# Patient Record
Sex: Male | Born: 1935 | Race: White | Hispanic: No | Marital: Married | Smoking: Former smoker
Health system: Southern US, Community
[De-identification: ages and names within clinical notes are randomized; demographics above are authoritative.]

## PROBLEM LIST (undated history)

## (undated) DIAGNOSIS — I10 Essential (primary) hypertension: Secondary | ICD-10-CM

## (undated) DIAGNOSIS — IMO0001 Reserved for inherently not codable concepts without codable children: Secondary | ICD-10-CM

## (undated) DIAGNOSIS — I351 Nonrheumatic aortic (valve) insufficiency: Secondary | ICD-10-CM

## (undated) DIAGNOSIS — M8580 Other specified disorders of bone density and structure, unspecified site: Secondary | ICD-10-CM

## (undated) DIAGNOSIS — E559 Vitamin D deficiency, unspecified: Secondary | ICD-10-CM

## (undated) DIAGNOSIS — I700 Atherosclerosis of aorta: Secondary | ICD-10-CM

## (undated) DIAGNOSIS — I4510 Unspecified right bundle-branch block: Secondary | ICD-10-CM

## (undated) DIAGNOSIS — C61 Malignant neoplasm of prostate: Secondary | ICD-10-CM

## (undated) DIAGNOSIS — E669 Obesity, unspecified: Secondary | ICD-10-CM

## (undated) HISTORY — DX: Other specified disorders of bone density and structure, unspecified site: M85.80

## (undated) HISTORY — DX: Essential (primary) hypertension: I10

## (undated) HISTORY — DX: Reserved for inherently not codable concepts without codable children: IMO0001

## (undated) HISTORY — DX: Vitamin D deficiency, unspecified: E55.9

## (undated) HISTORY — DX: Atherosclerosis of aorta: I70.0

## (undated) HISTORY — DX: Nonrheumatic aortic (valve) insufficiency: I35.1

## (undated) HISTORY — DX: Unspecified right bundle-branch block: I45.10

## (undated) HISTORY — DX: Malignant neoplasm of prostate: C61

## (undated) HISTORY — DX: Obesity, unspecified: E66.9

---

## 1999-12-24 ENCOUNTER — Other Ambulatory Visit: Admission: RE | Admit: 1999-12-24 | Discharge: 1999-12-24 | Payer: Self-pay | Admitting: Urology

## 2000-01-06 ENCOUNTER — Encounter: Admission: RE | Admit: 2000-01-06 | Discharge: 2000-01-06 | Payer: Self-pay | Admitting: Urology

## 2000-01-06 ENCOUNTER — Encounter: Payer: Self-pay | Admitting: Urology

## 2011-12-14 DIAGNOSIS — L259 Unspecified contact dermatitis, unspecified cause: Secondary | ICD-10-CM | POA: Diagnosis not present

## 2011-12-14 DIAGNOSIS — L578 Other skin changes due to chronic exposure to nonionizing radiation: Secondary | ICD-10-CM | POA: Diagnosis not present

## 2011-12-14 DIAGNOSIS — L570 Actinic keratosis: Secondary | ICD-10-CM | POA: Diagnosis not present

## 2012-03-02 DIAGNOSIS — Z79899 Other long term (current) drug therapy: Secondary | ICD-10-CM | POA: Diagnosis not present

## 2012-03-02 DIAGNOSIS — I10 Essential (primary) hypertension: Secondary | ICD-10-CM | POA: Diagnosis not present

## 2012-03-02 DIAGNOSIS — M1990 Unspecified osteoarthritis, unspecified site: Secondary | ICD-10-CM | POA: Diagnosis not present

## 2012-04-26 DIAGNOSIS — R609 Edema, unspecified: Secondary | ICD-10-CM | POA: Diagnosis not present

## 2012-04-26 DIAGNOSIS — Z0000 Encounter for general adult medical examination without abnormal findings: Secondary | ICD-10-CM | POA: Diagnosis not present

## 2012-04-26 DIAGNOSIS — I129 Hypertensive chronic kidney disease with stage 1 through stage 4 chronic kidney disease, or unspecified chronic kidney disease: Secondary | ICD-10-CM | POA: Diagnosis not present

## 2012-04-26 DIAGNOSIS — I359 Nonrheumatic aortic valve disorder, unspecified: Secondary | ICD-10-CM | POA: Diagnosis not present

## 2012-04-27 DIAGNOSIS — R011 Cardiac murmur, unspecified: Secondary | ICD-10-CM | POA: Diagnosis not present

## 2012-05-03 DIAGNOSIS — H2510 Age-related nuclear cataract, unspecified eye: Secondary | ICD-10-CM | POA: Diagnosis not present

## 2012-05-19 DIAGNOSIS — R609 Edema, unspecified: Secondary | ICD-10-CM | POA: Diagnosis not present

## 2012-05-19 DIAGNOSIS — I129 Hypertensive chronic kidney disease with stage 1 through stage 4 chronic kidney disease, or unspecified chronic kidney disease: Secondary | ICD-10-CM | POA: Diagnosis not present

## 2012-06-09 DIAGNOSIS — I129 Hypertensive chronic kidney disease with stage 1 through stage 4 chronic kidney disease, or unspecified chronic kidney disease: Secondary | ICD-10-CM | POA: Diagnosis not present

## 2012-06-09 DIAGNOSIS — R609 Edema, unspecified: Secondary | ICD-10-CM | POA: Diagnosis not present

## 2012-07-12 DIAGNOSIS — I129 Hypertensive chronic kidney disease with stage 1 through stage 4 chronic kidney disease, or unspecified chronic kidney disease: Secondary | ICD-10-CM | POA: Diagnosis not present

## 2012-07-12 DIAGNOSIS — R350 Frequency of micturition: Secondary | ICD-10-CM | POA: Diagnosis not present

## 2012-07-12 DIAGNOSIS — R609 Edema, unspecified: Secondary | ICD-10-CM | POA: Diagnosis not present

## 2012-07-12 DIAGNOSIS — Z23 Encounter for immunization: Secondary | ICD-10-CM | POA: Diagnosis not present

## 2012-07-12 DIAGNOSIS — I359 Nonrheumatic aortic valve disorder, unspecified: Secondary | ICD-10-CM | POA: Diagnosis not present

## 2012-07-28 DIAGNOSIS — R0602 Shortness of breath: Secondary | ICD-10-CM | POA: Diagnosis not present

## 2012-07-28 DIAGNOSIS — R05 Cough: Secondary | ICD-10-CM | POA: Diagnosis not present

## 2012-07-28 DIAGNOSIS — R609 Edema, unspecified: Secondary | ICD-10-CM | POA: Diagnosis not present

## 2012-08-02 DIAGNOSIS — C61 Malignant neoplasm of prostate: Secondary | ICD-10-CM | POA: Diagnosis not present

## 2012-08-04 DIAGNOSIS — R609 Edema, unspecified: Secondary | ICD-10-CM | POA: Diagnosis not present

## 2012-08-04 DIAGNOSIS — I129 Hypertensive chronic kidney disease with stage 1 through stage 4 chronic kidney disease, or unspecified chronic kidney disease: Secondary | ICD-10-CM | POA: Diagnosis not present

## 2012-08-08 DIAGNOSIS — R609 Edema, unspecified: Secondary | ICD-10-CM | POA: Diagnosis not present

## 2012-08-08 DIAGNOSIS — I129 Hypertensive chronic kidney disease with stage 1 through stage 4 chronic kidney disease, or unspecified chronic kidney disease: Secondary | ICD-10-CM | POA: Diagnosis not present

## 2012-08-08 DIAGNOSIS — Z79899 Other long term (current) drug therapy: Secondary | ICD-10-CM | POA: Diagnosis not present

## 2012-08-08 DIAGNOSIS — Z1331 Encounter for screening for depression: Secondary | ICD-10-CM | POA: Diagnosis not present

## 2012-08-09 DIAGNOSIS — C61 Malignant neoplasm of prostate: Secondary | ICD-10-CM | POA: Diagnosis not present

## 2012-08-23 DIAGNOSIS — I129 Hypertensive chronic kidney disease with stage 1 through stage 4 chronic kidney disease, or unspecified chronic kidney disease: Secondary | ICD-10-CM | POA: Diagnosis not present

## 2012-08-23 DIAGNOSIS — R609 Edema, unspecified: Secondary | ICD-10-CM | POA: Diagnosis not present

## 2012-09-04 DIAGNOSIS — E7800 Pure hypercholesterolemia, unspecified: Secondary | ICD-10-CM | POA: Diagnosis not present

## 2012-09-04 DIAGNOSIS — Z79899 Other long term (current) drug therapy: Secondary | ICD-10-CM | POA: Diagnosis not present

## 2012-09-04 DIAGNOSIS — I129 Hypertensive chronic kidney disease with stage 1 through stage 4 chronic kidney disease, or unspecified chronic kidney disease: Secondary | ICD-10-CM | POA: Diagnosis not present

## 2012-09-04 DIAGNOSIS — I359 Nonrheumatic aortic valve disorder, unspecified: Secondary | ICD-10-CM | POA: Diagnosis not present

## 2012-09-04 DIAGNOSIS — Z0000 Encounter for general adult medical examination without abnormal findings: Secondary | ICD-10-CM | POA: Diagnosis not present

## 2012-09-19 DIAGNOSIS — Z23 Encounter for immunization: Secondary | ICD-10-CM | POA: Diagnosis not present

## 2012-11-06 DIAGNOSIS — I129 Hypertensive chronic kidney disease with stage 1 through stage 4 chronic kidney disease, or unspecified chronic kidney disease: Secondary | ICD-10-CM | POA: Diagnosis not present

## 2012-11-06 DIAGNOSIS — R609 Edema, unspecified: Secondary | ICD-10-CM | POA: Diagnosis not present

## 2013-02-05 DIAGNOSIS — I10 Essential (primary) hypertension: Secondary | ICD-10-CM | POA: Diagnosis not present

## 2013-02-05 DIAGNOSIS — R609 Edema, unspecified: Secondary | ICD-10-CM | POA: Diagnosis not present

## 2013-05-10 DIAGNOSIS — H353190 Nonexudative age-related macular degeneration, unspecified eye, stage unspecified: Secondary | ICD-10-CM | POA: Diagnosis not present

## 2013-05-10 DIAGNOSIS — H2510 Age-related nuclear cataract, unspecified eye: Secondary | ICD-10-CM | POA: Diagnosis not present

## 2013-06-29 DIAGNOSIS — Z23 Encounter for immunization: Secondary | ICD-10-CM | POA: Diagnosis not present

## 2013-08-02 DIAGNOSIS — I359 Nonrheumatic aortic valve disorder, unspecified: Secondary | ICD-10-CM | POA: Diagnosis not present

## 2013-08-02 DIAGNOSIS — R269 Unspecified abnormalities of gait and mobility: Secondary | ICD-10-CM | POA: Diagnosis not present

## 2013-08-02 DIAGNOSIS — G479 Sleep disorder, unspecified: Secondary | ICD-10-CM | POA: Diagnosis not present

## 2013-08-02 DIAGNOSIS — J309 Allergic rhinitis, unspecified: Secondary | ICD-10-CM | POA: Diagnosis not present

## 2013-08-06 DIAGNOSIS — IMO0001 Reserved for inherently not codable concepts without codable children: Secondary | ICD-10-CM | POA: Diagnosis not present

## 2013-08-06 DIAGNOSIS — R269 Unspecified abnormalities of gait and mobility: Secondary | ICD-10-CM | POA: Diagnosis not present

## 2013-08-06 DIAGNOSIS — I359 Nonrheumatic aortic valve disorder, unspecified: Secondary | ICD-10-CM | POA: Diagnosis not present

## 2013-08-06 DIAGNOSIS — M899 Disorder of bone, unspecified: Secondary | ICD-10-CM | POA: Diagnosis not present

## 2013-08-06 DIAGNOSIS — E559 Vitamin D deficiency, unspecified: Secondary | ICD-10-CM | POA: Diagnosis not present

## 2013-08-06 DIAGNOSIS — I10 Essential (primary) hypertension: Secondary | ICD-10-CM | POA: Diagnosis not present

## 2013-08-13 ENCOUNTER — Ambulatory Visit: Payer: Self-pay

## 2013-08-15 ENCOUNTER — Ambulatory Visit: Payer: Medicare Other | Attending: Geriatric Medicine | Admitting: Physical Therapy

## 2013-08-15 DIAGNOSIS — R269 Unspecified abnormalities of gait and mobility: Secondary | ICD-10-CM | POA: Insufficient documentation

## 2013-08-15 DIAGNOSIS — IMO0001 Reserved for inherently not codable concepts without codable children: Secondary | ICD-10-CM | POA: Insufficient documentation

## 2013-08-22 ENCOUNTER — Ambulatory Visit: Payer: Medicare Other

## 2013-08-22 DIAGNOSIS — R269 Unspecified abnormalities of gait and mobility: Secondary | ICD-10-CM | POA: Diagnosis not present

## 2013-08-22 DIAGNOSIS — IMO0001 Reserved for inherently not codable concepts without codable children: Secondary | ICD-10-CM | POA: Diagnosis not present

## 2013-08-24 ENCOUNTER — Ambulatory Visit: Payer: Medicare Other | Admitting: Physical Therapy

## 2013-08-24 DIAGNOSIS — R269 Unspecified abnormalities of gait and mobility: Secondary | ICD-10-CM | POA: Diagnosis not present

## 2013-08-24 DIAGNOSIS — IMO0001 Reserved for inherently not codable concepts without codable children: Secondary | ICD-10-CM | POA: Diagnosis not present

## 2013-09-10 ENCOUNTER — Ambulatory Visit: Payer: Medicare Other | Attending: Geriatric Medicine | Admitting: Physical Therapy

## 2013-09-10 DIAGNOSIS — IMO0001 Reserved for inherently not codable concepts without codable children: Secondary | ICD-10-CM | POA: Diagnosis not present

## 2013-09-10 DIAGNOSIS — R269 Unspecified abnormalities of gait and mobility: Secondary | ICD-10-CM | POA: Insufficient documentation

## 2013-09-12 ENCOUNTER — Ambulatory Visit: Payer: Medicare Other | Admitting: Physical Therapy

## 2013-09-17 ENCOUNTER — Ambulatory Visit: Payer: Medicare Other | Admitting: Physical Therapy

## 2013-09-19 ENCOUNTER — Ambulatory Visit: Payer: Medicare Other | Admitting: Physical Therapy

## 2013-09-24 ENCOUNTER — Encounter: Payer: Self-pay | Admitting: Physical Therapy

## 2013-09-24 DIAGNOSIS — I359 Nonrheumatic aortic valve disorder, unspecified: Secondary | ICD-10-CM | POA: Diagnosis not present

## 2013-09-24 DIAGNOSIS — Z79899 Other long term (current) drug therapy: Secondary | ICD-10-CM | POA: Diagnosis not present

## 2013-09-24 DIAGNOSIS — R609 Edema, unspecified: Secondary | ICD-10-CM | POA: Diagnosis not present

## 2013-09-24 DIAGNOSIS — Z0000 Encounter for general adult medical examination without abnormal findings: Secondary | ICD-10-CM | POA: Diagnosis not present

## 2013-09-24 DIAGNOSIS — I129 Hypertensive chronic kidney disease with stage 1 through stage 4 chronic kidney disease, or unspecified chronic kidney disease: Secondary | ICD-10-CM | POA: Diagnosis not present

## 2013-09-24 DIAGNOSIS — Z1331 Encounter for screening for depression: Secondary | ICD-10-CM | POA: Diagnosis not present

## 2013-10-01 ENCOUNTER — Encounter: Payer: Self-pay | Admitting: Physical Therapy

## 2013-10-03 ENCOUNTER — Ambulatory Visit: Payer: Medicare Other | Admitting: Physical Therapy

## 2013-10-08 ENCOUNTER — Ambulatory Visit: Payer: Medicare Other | Attending: Geriatric Medicine | Admitting: Physical Therapy

## 2013-10-08 DIAGNOSIS — IMO0001 Reserved for inherently not codable concepts without codable children: Secondary | ICD-10-CM | POA: Diagnosis not present

## 2013-10-08 DIAGNOSIS — R269 Unspecified abnormalities of gait and mobility: Secondary | ICD-10-CM | POA: Insufficient documentation

## 2013-11-02 ENCOUNTER — Other Ambulatory Visit (HOSPITAL_COMMUNITY): Payer: Self-pay | Admitting: Geriatric Medicine

## 2013-11-02 ENCOUNTER — Ambulatory Visit (HOSPITAL_COMMUNITY): Payer: Medicare Other | Attending: Geriatric Medicine | Admitting: Cardiology

## 2013-11-02 DIAGNOSIS — I359 Nonrheumatic aortic valve disorder, unspecified: Secondary | ICD-10-CM

## 2013-11-02 DIAGNOSIS — I059 Rheumatic mitral valve disease, unspecified: Secondary | ICD-10-CM | POA: Diagnosis not present

## 2013-11-02 NOTE — Progress Notes (Signed)
Echo performed. 

## 2013-11-07 DIAGNOSIS — C61 Malignant neoplasm of prostate: Secondary | ICD-10-CM | POA: Diagnosis not present

## 2013-11-13 DIAGNOSIS — R351 Nocturia: Secondary | ICD-10-CM | POA: Diagnosis not present

## 2013-11-13 DIAGNOSIS — C61 Malignant neoplasm of prostate: Secondary | ICD-10-CM | POA: Diagnosis not present

## 2014-03-25 DIAGNOSIS — I359 Nonrheumatic aortic valve disorder, unspecified: Secondary | ICD-10-CM | POA: Diagnosis not present

## 2014-03-25 DIAGNOSIS — N183 Chronic kidney disease, stage 3 unspecified: Secondary | ICD-10-CM | POA: Diagnosis not present

## 2014-03-25 DIAGNOSIS — Z6831 Body mass index (BMI) 31.0-31.9, adult: Secondary | ICD-10-CM | POA: Diagnosis not present

## 2014-03-25 DIAGNOSIS — Z79899 Other long term (current) drug therapy: Secondary | ICD-10-CM | POA: Diagnosis not present

## 2014-03-25 DIAGNOSIS — E669 Obesity, unspecified: Secondary | ICD-10-CM | POA: Diagnosis not present

## 2014-03-25 DIAGNOSIS — M79609 Pain in unspecified limb: Secondary | ICD-10-CM | POA: Diagnosis not present

## 2014-03-25 DIAGNOSIS — I129 Hypertensive chronic kidney disease with stage 1 through stage 4 chronic kidney disease, or unspecified chronic kidney disease: Secondary | ICD-10-CM | POA: Diagnosis not present

## 2014-03-25 DIAGNOSIS — Z23 Encounter for immunization: Secondary | ICD-10-CM | POA: Diagnosis not present

## 2014-07-16 DIAGNOSIS — Z23 Encounter for immunization: Secondary | ICD-10-CM | POA: Diagnosis not present

## 2014-09-09 DIAGNOSIS — I10 Essential (primary) hypertension: Secondary | ICD-10-CM | POA: Diagnosis not present

## 2014-09-09 DIAGNOSIS — Z1389 Encounter for screening for other disorder: Secondary | ICD-10-CM | POA: Diagnosis not present

## 2014-09-09 DIAGNOSIS — M79606 Pain in leg, unspecified: Secondary | ICD-10-CM | POA: Diagnosis not present

## 2014-09-09 DIAGNOSIS — I351 Nonrheumatic aortic (valve) insufficiency: Secondary | ICD-10-CM | POA: Diagnosis not present

## 2014-09-09 DIAGNOSIS — Z79899 Other long term (current) drug therapy: Secondary | ICD-10-CM | POA: Diagnosis not present

## 2015-03-18 DIAGNOSIS — H5203 Hypermetropia, bilateral: Secondary | ICD-10-CM | POA: Diagnosis not present

## 2015-03-18 DIAGNOSIS — H25013 Cortical age-related cataract, bilateral: Secondary | ICD-10-CM | POA: Diagnosis not present

## 2015-03-18 DIAGNOSIS — H2513 Age-related nuclear cataract, bilateral: Secondary | ICD-10-CM | POA: Diagnosis not present

## 2015-03-18 DIAGNOSIS — H43813 Vitreous degeneration, bilateral: Secondary | ICD-10-CM | POA: Diagnosis not present

## 2015-03-31 ENCOUNTER — Other Ambulatory Visit: Payer: Self-pay

## 2015-05-06 DIAGNOSIS — I4891 Unspecified atrial fibrillation: Secondary | ICD-10-CM | POA: Diagnosis not present

## 2015-05-06 DIAGNOSIS — Z1389 Encounter for screening for other disorder: Secondary | ICD-10-CM | POA: Diagnosis not present

## 2015-05-06 DIAGNOSIS — Z0000 Encounter for general adult medical examination without abnormal findings: Secondary | ICD-10-CM | POA: Diagnosis not present

## 2015-05-06 DIAGNOSIS — I499 Cardiac arrhythmia, unspecified: Secondary | ICD-10-CM | POA: Diagnosis not present

## 2015-05-06 DIAGNOSIS — N183 Chronic kidney disease, stage 3 (moderate): Secondary | ICD-10-CM | POA: Diagnosis not present

## 2015-05-06 DIAGNOSIS — L989 Disorder of the skin and subcutaneous tissue, unspecified: Secondary | ICD-10-CM | POA: Diagnosis not present

## 2015-05-06 DIAGNOSIS — I351 Nonrheumatic aortic (valve) insufficiency: Secondary | ICD-10-CM | POA: Diagnosis not present

## 2015-05-06 DIAGNOSIS — Z79899 Other long term (current) drug therapy: Secondary | ICD-10-CM | POA: Diagnosis not present

## 2015-05-13 ENCOUNTER — Ambulatory Visit (HOSPITAL_COMMUNITY): Payer: Medicare Other

## 2015-07-08 DIAGNOSIS — Z23 Encounter for immunization: Secondary | ICD-10-CM | POA: Diagnosis not present

## 2015-08-08 DIAGNOSIS — I4891 Unspecified atrial fibrillation: Secondary | ICD-10-CM | POA: Diagnosis not present

## 2015-08-08 DIAGNOSIS — I10 Essential (primary) hypertension: Secondary | ICD-10-CM | POA: Diagnosis not present

## 2016-06-24 DIAGNOSIS — Z23 Encounter for immunization: Secondary | ICD-10-CM | POA: Diagnosis not present

## 2016-07-06 DIAGNOSIS — R600 Localized edema: Secondary | ICD-10-CM | POA: Diagnosis not present

## 2016-07-06 DIAGNOSIS — Z1389 Encounter for screening for other disorder: Secondary | ICD-10-CM | POA: Diagnosis not present

## 2016-07-06 DIAGNOSIS — Z79899 Other long term (current) drug therapy: Secondary | ICD-10-CM | POA: Diagnosis not present

## 2016-07-06 DIAGNOSIS — I4891 Unspecified atrial fibrillation: Secondary | ICD-10-CM | POA: Diagnosis not present

## 2016-07-06 DIAGNOSIS — I10 Essential (primary) hypertension: Secondary | ICD-10-CM | POA: Diagnosis not present

## 2016-07-28 ENCOUNTER — Encounter (HOSPITAL_COMMUNITY): Payer: Self-pay | Admitting: Emergency Medicine

## 2016-07-28 ENCOUNTER — Emergency Department (HOSPITAL_COMMUNITY): Payer: Medicare Other

## 2016-07-28 ENCOUNTER — Inpatient Hospital Stay (HOSPITAL_COMMUNITY)
Admission: EM | Admit: 2016-07-28 | Discharge: 2016-08-02 | DRG: 065 | Disposition: A | Discharge status: Rehabilitation Facility | Payer: Medicare Other | Attending: Neurology | Admitting: Neurology

## 2016-07-28 ENCOUNTER — Inpatient Hospital Stay (HOSPITAL_COMMUNITY): Payer: Medicare Other

## 2016-07-28 DIAGNOSIS — I629 Nontraumatic intracranial hemorrhage, unspecified: Secondary | ICD-10-CM | POA: Diagnosis not present

## 2016-07-28 DIAGNOSIS — R2689 Other abnormalities of gait and mobility: Secondary | ICD-10-CM | POA: Diagnosis present

## 2016-07-28 DIAGNOSIS — Z7982 Long term (current) use of aspirin: Secondary | ICD-10-CM | POA: Diagnosis not present

## 2016-07-28 DIAGNOSIS — I619 Nontraumatic intracerebral hemorrhage, unspecified: Secondary | ICD-10-CM | POA: Diagnosis not present

## 2016-07-28 DIAGNOSIS — I69398 Other sequelae of cerebral infarction: Secondary | ICD-10-CM | POA: Diagnosis not present

## 2016-07-28 DIAGNOSIS — I639 Cerebral infarction, unspecified: Secondary | ICD-10-CM | POA: Diagnosis not present

## 2016-07-28 DIAGNOSIS — I700 Atherosclerosis of aorta: Secondary | ICD-10-CM | POA: Diagnosis present

## 2016-07-28 DIAGNOSIS — Z6830 Body mass index (BMI) 30.0-30.9, adult: Secondary | ICD-10-CM | POA: Diagnosis not present

## 2016-07-28 DIAGNOSIS — E785 Hyperlipidemia, unspecified: Secondary | ICD-10-CM | POA: Diagnosis present

## 2016-07-28 DIAGNOSIS — E559 Vitamin D deficiency, unspecified: Secondary | ICD-10-CM | POA: Diagnosis present

## 2016-07-28 DIAGNOSIS — I10 Essential (primary) hypertension: Secondary | ICD-10-CM | POA: Diagnosis present

## 2016-07-28 DIAGNOSIS — Z87891 Personal history of nicotine dependence: Secondary | ICD-10-CM

## 2016-07-28 DIAGNOSIS — G8194 Hemiplegia, unspecified affecting left nondominant side: Secondary | ICD-10-CM | POA: Diagnosis not present

## 2016-07-28 DIAGNOSIS — E669 Obesity, unspecified: Secondary | ICD-10-CM | POA: Diagnosis present

## 2016-07-28 DIAGNOSIS — F5104 Psychophysiologic insomnia: Secondary | ICD-10-CM | POA: Diagnosis present

## 2016-07-28 DIAGNOSIS — T402X5A Adverse effect of other opioids, initial encounter: Secondary | ICD-10-CM | POA: Diagnosis not present

## 2016-07-28 DIAGNOSIS — I6789 Other cerebrovascular disease: Secondary | ICD-10-CM | POA: Diagnosis not present

## 2016-07-28 DIAGNOSIS — M1612 Unilateral primary osteoarthritis, left hip: Secondary | ICD-10-CM | POA: Diagnosis present

## 2016-07-28 DIAGNOSIS — I69322 Dysarthria following cerebral infarction: Secondary | ICD-10-CM | POA: Diagnosis not present

## 2016-07-28 DIAGNOSIS — I4891 Unspecified atrial fibrillation: Secondary | ICD-10-CM

## 2016-07-28 DIAGNOSIS — C61 Malignant neoplasm of prostate: Secondary | ICD-10-CM | POA: Diagnosis present

## 2016-07-28 DIAGNOSIS — R531 Weakness: Secondary | ICD-10-CM

## 2016-07-28 DIAGNOSIS — I482 Chronic atrial fibrillation, unspecified: Secondary | ICD-10-CM | POA: Diagnosis present

## 2016-07-28 DIAGNOSIS — R29818 Other symptoms and signs involving the nervous system: Secondary | ICD-10-CM | POA: Diagnosis not present

## 2016-07-28 DIAGNOSIS — R600 Localized edema: Secondary | ICD-10-CM | POA: Diagnosis not present

## 2016-07-28 DIAGNOSIS — I509 Heart failure, unspecified: Secondary | ICD-10-CM | POA: Diagnosis present

## 2016-07-28 DIAGNOSIS — I161 Hypertensive emergency: Secondary | ICD-10-CM | POA: Diagnosis present

## 2016-07-28 DIAGNOSIS — Z8546 Personal history of malignant neoplasm of prostate: Secondary | ICD-10-CM | POA: Diagnosis not present

## 2016-07-28 DIAGNOSIS — Z79899 Other long term (current) drug therapy: Secondary | ICD-10-CM | POA: Diagnosis not present

## 2016-07-28 DIAGNOSIS — L89150 Pressure ulcer of sacral region, unstageable: Secondary | ICD-10-CM | POA: Diagnosis present

## 2016-07-28 DIAGNOSIS — S299XXA Unspecified injury of thorax, initial encounter: Secondary | ICD-10-CM | POA: Diagnosis not present

## 2016-07-28 DIAGNOSIS — E782 Mixed hyperlipidemia: Secondary | ICD-10-CM | POA: Diagnosis present

## 2016-07-28 DIAGNOSIS — F4322 Adjustment disorder with anxiety: Secondary | ICD-10-CM | POA: Diagnosis present

## 2016-07-28 DIAGNOSIS — T148XXA Other injury of unspecified body region, initial encounter: Secondary | ICD-10-CM | POA: Diagnosis not present

## 2016-07-28 DIAGNOSIS — I351 Nonrheumatic aortic (valve) insufficiency: Secondary | ICD-10-CM | POA: Diagnosis present

## 2016-07-28 DIAGNOSIS — I4510 Unspecified right bundle-branch block: Secondary | ICD-10-CM | POA: Diagnosis present

## 2016-07-28 DIAGNOSIS — Z888 Allergy status to other drugs, medicaments and biological substances status: Secondary | ICD-10-CM | POA: Diagnosis not present

## 2016-07-28 DIAGNOSIS — I69354 Hemiplegia and hemiparesis following cerebral infarction affecting left non-dominant side: Secondary | ICD-10-CM | POA: Diagnosis not present

## 2016-07-28 DIAGNOSIS — I610 Nontraumatic intracerebral hemorrhage in hemisphere, subcortical: Secondary | ICD-10-CM | POA: Diagnosis not present

## 2016-07-28 DIAGNOSIS — I350 Nonrheumatic aortic (valve) stenosis: Secondary | ICD-10-CM | POA: Diagnosis present

## 2016-07-28 DIAGNOSIS — N390 Urinary tract infection, site not specified: Secondary | ICD-10-CM | POA: Diagnosis not present

## 2016-07-28 DIAGNOSIS — K5903 Drug induced constipation: Secondary | ICD-10-CM | POA: Diagnosis not present

## 2016-07-28 LAB — COMPREHENSIVE METABOLIC PANEL
ALK PHOS: 61 U/L (ref 38–126)
ALT: 21 U/L (ref 17–63)
AST: 30 U/L (ref 15–41)
Albumin: 3.9 g/dL (ref 3.5–5.0)
Anion gap: 9 (ref 5–15)
BILIRUBIN TOTAL: 0.8 mg/dL (ref 0.3–1.2)
BUN: 22 mg/dL — AB (ref 6–20)
CALCIUM: 9.6 mg/dL (ref 8.9–10.3)
CHLORIDE: 106 mmol/L (ref 101–111)
CO2: 24 mmol/L (ref 22–32)
CREATININE: 1.4 mg/dL — AB (ref 0.61–1.24)
GFR, EST AFRICAN AMERICAN: 48 mL/min — AB (ref >60)
GFR, EST NON AFRICAN AMERICAN: 41 mL/min — AB (ref >60)
Glucose, Bld: 135 mg/dL — ABNORMAL HIGH (ref 65–99)
Potassium: 3.8 mmol/L (ref 3.5–5.1)
Sodium: 139 mmol/L (ref 135–145)
TOTAL PROTEIN: 6.7 g/dL (ref 6.5–8.1)

## 2016-07-28 LAB — CBC
HEMATOCRIT: 46.3 % (ref 39.0–52.0)
HEMOGLOBIN: 15.8 g/dL (ref 13.0–17.0)
MCH: 31.8 pg (ref 26.0–34.0)
MCHC: 34.1 g/dL (ref 30.0–36.0)
MCV: 93.2 fL (ref 78.0–100.0)
Platelets: 162 10*3/uL (ref 150–400)
RBC: 4.97 MIL/uL (ref 4.22–5.81)
RDW: 13.9 % (ref 11.5–15.5)
WBC: 7.6 10*3/uL (ref 4.0–10.5)

## 2016-07-28 LAB — I-STAT CHEM 8, ED
BUN: 25 mg/dL — ABNORMAL HIGH (ref 6–20)
CALCIUM ION: 1.1 mmol/L — AB (ref 1.15–1.40)
CHLORIDE: 105 mmol/L (ref 101–111)
CREATININE: 1.4 mg/dL — AB (ref 0.61–1.24)
GLUCOSE: 131 mg/dL — AB (ref 65–99)
HCT: 47 % (ref 39.0–52.0)
HEMOGLOBIN: 16 g/dL (ref 13.0–17.0)
POTASSIUM: 3.7 mmol/L (ref 3.5–5.1)
Sodium: 141 mmol/L (ref 135–145)
TCO2: 24 mmol/L (ref 0–100)

## 2016-07-28 LAB — ETHANOL: Alcohol, Ethyl (B): <5 mg/dL (ref <5)

## 2016-07-28 LAB — DIFFERENTIAL
BASOS ABS: 0.1 10*3/uL (ref 0.0–0.1)
Basophils Relative: 1 %
Eosinophils Absolute: 0.2 10*3/uL (ref 0.0–0.7)
Eosinophils Relative: 2 %
LYMPHS ABS: 2.4 10*3/uL (ref 0.7–4.0)
LYMPHS PCT: 32 %
MONO ABS: 0.7 10*3/uL (ref 0.1–1.0)
MONOS PCT: 9 %
NEUTROS ABS: 4.3 10*3/uL (ref 1.7–7.7)
Neutrophils Relative %: 56 %

## 2016-07-28 LAB — APTT: APTT: 33 s (ref 24–36)

## 2016-07-28 LAB — PROTIME-INR
INR: 0.98
Prothrombin Time: 13 seconds (ref 11.4–15.2)

## 2016-07-28 LAB — I-STAT TROPONIN, ED: TROPONIN I, POC: 0.02 ng/mL (ref 0.00–0.08)

## 2016-07-28 LAB — CBG MONITORING, ED: Glucose-Capillary: 138 mg/dL — ABNORMAL HIGH (ref 65–99)

## 2016-07-28 MED ORDER — STROKE: EARLY STAGES OF RECOVERY BOOK
Freq: Once | Status: AC
  Administered 2016-07-29: 02:00:00
  Filled 2016-07-28: qty 1

## 2016-07-28 MED ORDER — ACETAMINOPHEN 650 MG RE SUPP: 650.0000 mg | RECTAL | Status: DC | PRN

## 2016-07-28 MED ORDER — SENNOSIDES-DOCUSATE SODIUM 8.6-50 MG PO TABS
1.0000 | ORAL_TABLET | Freq: Two times a day (BID) | ORAL | Status: DC
  Administered 2016-07-29 – 2016-08-02 (×8): 1 via ORAL
  Filled 2016-07-28 (×9): qty 1

## 2016-07-28 MED ORDER — LABETALOL HCL 5 MG/ML IV SOLN
10.0000 mg | Freq: Once | INTRAVENOUS | Status: AC
  Administered 2016-07-28: 10 mg via INTRAVENOUS
  Filled 2016-07-28: qty 4

## 2016-07-28 MED ORDER — FUROSEMIDE 10 MG/ML IJ SOLN
20.0000 mg | Freq: Every day | INTRAMUSCULAR | Status: DC
  Filled 2016-07-28: qty 2

## 2016-07-28 MED ORDER — CLEVIDIPINE BUTYRATE 0.5 MG/ML IV EMUL
0.0000 mg/h | INTRAVENOUS | Status: DC
  Administered 2016-07-28: 1 mg/h via INTRAVENOUS
  Administered 2016-07-29: 5 mg/h via INTRAVENOUS
  Administered 2016-07-29 (×2): 3 mg/h via INTRAVENOUS
  Administered 2016-07-29: 2 mg/h via INTRAVENOUS
  Filled 2016-07-28 (×6): qty 50

## 2016-07-28 MED ORDER — FAMOTIDINE IN NACL 20-0.9 MG/50ML-% IV SOLN
20.0000 mg | INTRAVENOUS | Status: DC
  Administered 2016-07-29: 20 mg via INTRAVENOUS
  Filled 2016-07-28: qty 50

## 2016-07-28 MED ORDER — ACETAMINOPHEN 325 MG PO TABS
650.0000 mg | ORAL_TABLET | ORAL | Status: DC | PRN
  Administered 2016-07-31 – 2016-08-02 (×5): 650 mg via ORAL
  Filled 2016-07-28 (×6): qty 2

## 2016-07-28 NOTE — H&P (Addendum)
Neurology H&P  CC: Left-sided weakness  History is obtained from: Patient  HPI: Thomas Burnett is a 82 y.o. male with a history of hypertension who is only currently taking Lasix. He was in his normal state of health until 19:15 at which time he developed left-sided weakness. EMS responded and found him to have left-sided weakness and therefore called a code stroke and brought him into the emergency department. On their initial evaluation, he had blood pressures in the 220s.  In CT, he was observed to have a small thalamic hemorrhage. He was given labetalol, and initially seemed to have a good response but subsequently his blood pressure went back up and he was started on call to proximal.   LKW: 19:15 tpa given?: no, ICH ICH Score: 1   ROS: A 14 point ROS was performed and is negative except as noted in the HPI.   Past Medical History:  Diagnosis Date  . Hypertension   . Mild aortic insufficiency   . Mild aortic sclerosis ([place])   . Obesity   . Osteopenia   . Prostate cancer ([place])    STAGE 2  . Right bundle branch block (RBBB)   . Vitamin D deficiency      History reviewed. No pertinent family history.   Social History:  reports that he has quit smoking. He has never used smokeless tobacco. He reports that he drinks alcohol. He reports that he does not use drugs.   Exam: Current vital signs: BP 136/93   Pulse 86   Temp 97.9 F (36.6 C) (Oral)   Resp (!) 28   Ht 5\' 4"  (1.626 m)   Wt 81.6 kg (180 lb)   SpO2 95%   BMI 30.90 kg/m  Vital signs in last 24 hours: Temp:  [97.9 F (36.6 C)] 97.9 F (36.6 C) (10/25 2035) Pulse Rate:  [30-93] 86 (10/25 2200) Resp:  [15-28] 28 (10/25 2200) BP: (129-237)/(70-106) 136/93 (10/25 2200) SpO2:  [93 %-98 %] 95 % (10/25 2200) Weight:  [81.6 kg (180 lb)] 81.6 kg (180 lb) (10/25 2035)   Physical Exam  Constitutional: Appears well-developed and well-nourished.  Psych: Affect appropriate to situation Eyes: No scleral  injection HENT: No OP obstrucion Head: Normocephalic.  Cardiovascular: Normal rate and regular rhythm.  Respiratory: Effort normal and breath sounds normal to anterior ascultation GI: Soft.  No distension. There is no tenderness.  Skin: WDI  Neuro: Mental Status: Patient is awake, alert, oriented to person, place, month, year, and situation. Patient is able to give a clear and coherent history. No signs of aphasia or neglect Cranial Nerves: II: Visual Fields are full. Pupils are equal, round, and reactive to light.   III,IV, VI: EOMI without ptosis or diploplia.  V: Facial sensation is symmetric to temperature VII: Facial movement is notable for possible mild left weakness VIII: hearing is intact to voice X: Uvula elevates symmetrically XI: Shoulder shrug is symmetric. XII: tongue is midline without atrophy or fasciculations.  Motor: Tone is normal. Bulk is normal. 5/5 strength was present on the right side, he has poorly coordinated, ballistic movements of the left arm and leg with 3/5 weakness. Sensory: Sensation is symmetric to light touch and temperature in the arms and legs. Cerebellar: He has intact finger-nose-finger on the right, he is unable to perform on the left.   I have reviewed labs in epic and the results pertinent to this consultation are: Borderline creatinine  I have reviewed the images obtained: CT head thalamic hemorrhage  on the right  Impression: 82 year old gentleman with thalamic hemorrhage. He will need aggressive blood pressure control and therefore is being admitted to the intensive care unit. I did discuss CODE STATUS with him, and he is indeed full code. He does indicate, however, that he would not want any type of surgical procedure.  Recommendations: 1) Admit to ICU 2) no antiplatelets or anticoagulants 3) blood pressure control with goal systolic [id number] 4) Frequent neuro checks 5) If symptoms worsen or there is decreased mental status, repeat  stat head CT 6) PT,OT,ST 7) continue Lasix 8) cleviprex for BP control  This patient is critically ill and at significant risk of neurological worsening, death and care requires constant monitoring of vital signs, hemodynamics,respiratory and cardiac monitoring, neurological assessment, discussion with family, other specialists and medical decision making of high complexity. I spent 50 minutes of neurocritical care time  in the care of  this patient.  Roland Rack, MD [facility] [phone]  If 7pm- 7am, please page neurology on call as listed in [employer]. 07/28/2016  10:16 PM

## 2016-07-28 NOTE — ED Notes (Signed)
Pt able to move LEFT arm at this time however has no control over movements or coordination of LEFT arm; initial NIH 3; PASSED RN swallow screen; treating HTN with cleviprex - still titrating; CT showed hemorrhagic stroke therefore TPA not given; pt CAOx4 at this time; family at bedside very supportive and understands diagnosis; pts heart rate in Afib and occasionally in A flutter

## 2016-07-28 NOTE — ED Provider Notes (Signed)
[place] DEPT Provider Note   CSN: [id number] Arrival date & time: 07/28/16  2013   An emergency department physician performed an initial assessment on this suspected stroke patient at 71.  History   Chief Complaint Left-sided weakness  HPI Thomas Burnett is a 82 y.o. male.  HPI Patient presents to the emergency room for evaluation of possible stroke.  Patient states he had left-sided weakness this evening. He was sitting on a stool and fell off. Patient denies hitting his head. He denies loss of consciousness. He denies any neck pain. Patient denies any chest pain or shortness of breath. EMS activated a code stroke. The patient was evaluated by Dr. Leonel Ramsay at the bridge. Past Medical History:  Diagnosis Date  . Hypertension   . Mild aortic insufficiency   . Mild aortic sclerosis ([place])   . Obesity   . Osteopenia   . Prostate cancer ([place])    STAGE 2  . Right bundle branch block (RBBB)   . Vitamin D deficiency     There are no active problems to display for this patient.   History reviewed. No pertinent surgical history.     Home Medications    Prior to Admission medications   Medication Sig Start Date End Date Taking? Authorizing Provider  Cholecalciferol (VITAMIN D-3) 1000 UNITS CAPS Take by mouth.    Historical Provider, MD  furosemide (LASIX) 40 MG tablet Take 40 mg by mouth as needed.    Historical Provider, MD  naproxen sodium (ANAPROX) 220 MG tablet Take 220 mg by mouth 2 (two) times daily with a meal.    Historical Provider, MD    Family History History reviewed. No pertinent family history.  Social History Social History  Substance Use Topics  . Smoking status: Former Research scientist (life sciences)  . Smokeless tobacco: Never Used  . Alcohol use Yes     Allergies   Xarelto [rivaroxaban]   Review of Systems Review of Systems  All other systems reviewed and are negative.    Physical Exam Updated Vital Signs BP (!) 237/85 (BP Location: Right Arm)    Pulse 67   Temp 97.9 F (36.6 C) (Oral)   Resp 21   Ht 5\' 4"  (1.626 m)   Wt 81.6 kg   SpO2 98%   BMI 30.90 kg/m   Physical Exam  Constitutional: No distress.  Elderly frail  HENT:  Head: Normocephalic and atraumatic.  Right Ear: External ear normal.  Left Ear: External ear normal.  Eyes: Conjunctivae are normal. Right eye exhibits no discharge. Left eye exhibits no discharge. No scleral icterus.  Neck: Neck supple. No tracheal deviation present.  Cardiovascular: Normal rate, regular rhythm and intact distal pulses.   Pulmonary/Chest: Effort normal and breath sounds normal. No stridor. No respiratory distress. He has no wheezes. He has no rales.  Abdominal: Soft. Bowel sounds are normal. He exhibits no distension. There is no tenderness. There is no rebound and no guarding.  Musculoskeletal: He exhibits no edema or tenderness.  Superficial skin tear left elbow, no pain with range of motion, no tenderness palpation upper or lower extremities, no spinal tenderness  Neurological: He is alert. He has normal strength. No cranial nerve deficit (no facial droop, extraocular movements intact, no slurred speech) or sensory deficit. He exhibits normal muscle tone. He displays no seizure activity. Coordination normal.  Left upper extrem and lower extrem weakness  Skin: Skin is warm and dry. No rash noted.  Psychiatric: He has a normal mood and affect.  Nursing note and vitals reviewed.    ED Treatments / Results  Labs (all labs ordered are listed, but only abnormal results are displayed) Labs Reviewed  I-STAT CHEM 8, ED - Abnormal; Notable for the following:       Result Value   BUN 25 (*)    Creatinine, Ser 1.40 (*)    Glucose, Bld 131 (*)    Calcium, Ion 1.10 (*)    All other components within normal limits  CBG MONITORING, ED - Abnormal; Notable for the following:    Glucose-Capillary 138 (*)    All other components within normal limits  PROTIME-INR  APTT  CBC  DIFFERENTIAL    ETHANOL  COMPREHENSIVE METABOLIC PANEL  RAPID URINE DRUG SCREEN, HOSP PERFORMED  URINALYSIS, ROUTINE W REFLEX MICROSCOPIC (NOT AT [facility])  I-STAT TROPOININ, ED    EKG  EKG Interpretation  Date/Time:  Wednesday July 28 2016 20:32:51 EDT Ventricular Rate:  65 PR Interval:    QRS Duration: 165 QT Interval:  458 QTC Calculation: 477 R Axis:   95 Text Interpretation:  Atrial flutter with predominant 4:1 AV block RBBB and LPFB No previous tracing Confirmed by Treniece Holsclaw  MD-J, Johnhenry Tippin [id number]) on 07/28/2016 8:45:53 PM       Radiology No results found.  Procedures Procedures (including critical care time)  Medications Ordered in ED Medications  labetalol (NORMODYNE,TRANDATE) injection 10 mg (not administered)     Initial Impression / Assessment and Plan / ED Course  I have reviewed the triage vital signs and the nursing notes.  Pertinent labs & imaging results that were available during my care of the patient were reviewed by me and considered in my medical decision making (see chart for details).  Clinical Course   Patient's CT scan shows a hemorrhagic stroke.  Patient denies any neck pain but he mentions falling off a stool. Plan on doing CT of the cervical spine. Patient is hypertensive and Dr. Leonel Ramsay ordered a dose of labetalol. Plan is for the patient be admitted to the neuro intensive care unit.  Final Clinical Impressions(s) / ED Diagnoses   Final diagnoses:  Hemorrhagic stroke [facility])  Atrial fibrillation, unspecified type (HCC)      Dorie Rank, MD 07/28/16 2048

## 2016-07-28 NOTE — ED Notes (Signed)
Connected pt back to monitor.

## 2016-07-28 NOTE — ED Notes (Signed)
RN called to inform that pt ready for CT C-spine

## 2016-07-28 NOTE — ED Notes (Addendum)
Condom cath placed at this time; no urine output

## 2016-07-28 NOTE — ED Triage Notes (Signed)
Pt presents from home with [facility] with stroke like symptoms consisting of LEFT arm weakness/ no control of the LEFT arm; pt states he was in the bathroom getting ready for bed and dropped tooth paste, bent down to pick up the tooth paste and lost balance and fell striking the LEFT shoulder and LEFT elbow; pt denies pain however a skin tear noted to LEFT elbow; LSN time was 1930; EMS reports hypertension enroute; pt denies visual changes and speech changes; pt CAOx4 at this time answering all questions appropriately; pt denies being on blood thinners

## 2016-07-29 ENCOUNTER — Inpatient Hospital Stay (HOSPITAL_COMMUNITY): Payer: Medicare Other

## 2016-07-29 DIAGNOSIS — I482 Chronic atrial fibrillation: Secondary | ICD-10-CM

## 2016-07-29 DIAGNOSIS — I639 Cerebral infarction, unspecified: Secondary | ICD-10-CM

## 2016-07-29 DIAGNOSIS — I10 Essential (primary) hypertension: Secondary | ICD-10-CM

## 2016-07-29 DIAGNOSIS — I6789 Other cerebrovascular disease: Secondary | ICD-10-CM

## 2016-07-29 LAB — CBC
HCT: 44.2 % (ref 39.0–52.0)
HEMOGLOBIN: 15 g/dL (ref 13.0–17.0)
MCH: 32.1 pg (ref 26.0–34.0)
MCHC: 33.9 g/dL (ref 30.0–36.0)
MCV: 94.4 fL (ref 78.0–100.0)
PLATELETS: 168 10*3/uL (ref 150–400)
RBC: 4.68 MIL/uL (ref 4.22–5.81)
RDW: 14.4 % (ref 11.5–15.5)
WBC: 8.2 10*3/uL (ref 4.0–10.5)

## 2016-07-29 LAB — BASIC METABOLIC PANEL
Anion gap: 8 (ref 5–15)
BUN: 18 mg/dL (ref 6–20)
CHLORIDE: 107 mmol/L (ref 101–111)
CO2: 24 mmol/L (ref 22–32)
CREATININE: 1.11 mg/dL (ref 0.61–1.24)
Calcium: 9.3 mg/dL (ref 8.9–10.3)
GFR, EST NON AFRICAN AMERICAN: 55 mL/min — AB (ref >60)
Glucose, Bld: 129 mg/dL — ABNORMAL HIGH (ref 65–99)
POTASSIUM: 3.6 mmol/L (ref 3.5–5.1)
SODIUM: 139 mmol/L (ref 135–145)

## 2016-07-29 LAB — URINALYSIS, ROUTINE W REFLEX MICROSCOPIC
BILIRUBIN URINE: NEGATIVE
GLUCOSE, UA: NEGATIVE mg/dL
HGB URINE DIPSTICK: NEGATIVE
KETONES UR: NEGATIVE mg/dL
Leukocytes, UA: NEGATIVE
Nitrite: NEGATIVE
Protein, ur: 30 mg/dL — AB
Specific Gravity, Urine: 1.018 (ref 1.005–1.030)
pH: 7 (ref 5.0–8.0)

## 2016-07-29 LAB — VAS US CAROTID
LCCADDIAS: 11 cm/s
LEFT ECA DIAS: 0 cm/s
LEFT VERTEBRAL DIAS: 13 cm/s
LICADDIAS: -12 cm/s
LICAPDIAS: -11 cm/s
LICAPSYS: -140 cm/s
Left CCA dist sys: 104 cm/s
Left CCA prox dias: 8 cm/s
Left CCA prox sys: 103 cm/s
Left ICA dist sys: -51 cm/s
RIGHT ECA DIAS: -1 cm/s
RIGHT VERTEBRAL DIAS: 0 cm/s
Right CCA prox dias: 8 cm/s
Right CCA prox sys: 77 cm/s
Right cca dist sys: -56 cm/s

## 2016-07-29 LAB — RAPID URINE DRUG SCREEN, HOSP PERFORMED
AMPHETAMINES: NOT DETECTED
BARBITURATES: NOT DETECTED
BENZODIAZEPINES: NOT DETECTED
COCAINE: NOT DETECTED
Opiates: NOT DETECTED
TETRAHYDROCANNABINOL: NOT DETECTED

## 2016-07-29 LAB — URINE MICROSCOPIC-ADD ON: RBC / HPF: NONE SEEN RBC/hpf (ref 0–5)

## 2016-07-29 LAB — TSH: TSH: 2.365 u[IU]/mL (ref 0.350–4.500)

## 2016-07-29 LAB — VITAMIN B12: VITAMIN B 12: 211 pg/mL (ref 180–914)

## 2016-07-29 LAB — ECHOCARDIOGRAM COMPLETE
Height: 66 in
Weight: 2980.62 oz

## 2016-07-29 LAB — MRSA PCR SCREENING: MRSA BY PCR: NEGATIVE

## 2016-07-29 LAB — LIPID PANEL
CHOL/HDL RATIO: 3.6 ratio
CHOLESTEROL: 220 mg/dL — AB (ref 0–200)
HDL: 61 mg/dL (ref >40)
LDL Cholesterol: 146 mg/dL — ABNORMAL HIGH (ref 0–99)
TRIGLYCERIDES: 63 mg/dL (ref <150)
VLDL: 13 mg/dL (ref 0–40)

## 2016-07-29 MED ORDER — PANTOPRAZOLE SODIUM 40 MG PO TBEC
40.0000 mg | DELAYED_RELEASE_TABLET | Freq: Every day | ORAL | Status: DC
  Administered 2016-07-29 – 2016-08-02 (×5): 40 mg via ORAL
  Filled 2016-07-29 (×5): qty 1

## 2016-07-29 MED ORDER — LISINOPRIL 20 MG PO TABS
20.0000 mg | ORAL_TABLET | Freq: Every day | ORAL | Status: DC
  Administered 2016-07-29 – 2016-08-02 (×5): 20 mg via ORAL
  Filled 2016-07-29 (×5): qty 1

## 2016-07-29 MED ORDER — LORATADINE 10 MG PO TABS
10.0000 mg | ORAL_TABLET | Freq: Every day | ORAL | Status: DC
  Administered 2016-07-31 – 2016-08-02 (×3): 10 mg via ORAL
  Filled 2016-07-29 (×4): qty 1

## 2016-07-29 MED ORDER — VITAMIN D 1000 UNITS PO TABS
1000.0000 [IU] | ORAL_TABLET | Freq: Every day | ORAL | Status: DC
  Administered 2016-07-29 – 2016-08-02 (×5): 1000 [IU] via ORAL
  Filled 2016-07-29 (×5): qty 1

## 2016-07-29 MED ORDER — HYDRALAZINE HCL 25 MG PO TABS
25.0000 mg | ORAL_TABLET | Freq: Three times a day (TID) | ORAL | Status: DC
  Administered 2016-07-29 – 2016-07-31 (×7): 25 mg via ORAL
  Filled 2016-07-29 (×7): qty 1

## 2016-07-29 MED ORDER — FUROSEMIDE 20 MG PO TABS
20.0000 mg | ORAL_TABLET | Freq: Every day | ORAL | Status: DC
  Administered 2016-07-29 – 2016-08-02 (×5): 20 mg via ORAL
  Filled 2016-07-29 (×5): qty 1

## 2016-07-29 NOTE — Progress Notes (Signed)
  Echocardiogram 2D Echocardiogram has been performed.  Thomas Burnett 07/29/2016, 3:24 PM

## 2016-07-29 NOTE — Progress Notes (Signed)
Patient had a 18 beat run of v-tach. Asymptomatic, alert with no complaints. Back in a heart block, irregular rhythm rate 70s. Notified Leonel Ramsay, order received for BMET and mag. Will continue to monitor.

## 2016-07-29 NOTE — Evaluation (Signed)
Speech Language Pathology Evaluation Patient Details Name: Thomas Burnett MRN: [id number] DOB: Nov 14, 1919 Today's Date: 07/29/2016 Time: [id number] SLP Time Calculation (min) (ACUTE ONLY): 11 min  Problem List:  Patient Active Problem List   Diagnosis Date Noted  . ICH (intracerebral hemorrhage) ([place]) 07/28/2016   Past Medical History:  Past Medical History:  Diagnosis Date  . Hypertension   . Mild aortic insufficiency   . Mild aortic sclerosis ([place])   . Obesity   . Osteopenia   . Prostate cancer ([place])    STAGE 2  . Right bundle branch block (RBBB)   . Vitamin D deficiency    Past Surgical History: History reviewed. No pertinent surgical history. HPI:  Thomas Wollam Johnsonis a 82 y.o.malewith a history of hypertension, mild aortic sclerosis admitted with left-sided weakness. In CT, he was observed to have a small thalamic hemorrhage   Assessment / Plan / Recommendation Clinical Impression  Pt's language and cognition appear within functional limits on selective subtests of Cognistat. Pt and wife deny cogntive or speech differences. No follow up needed from ST services at this time.    SLP Assessment  Patient does not need any further Speech Lanaguage Pathology Services    Follow Up Recommendations  None    Frequency and Duration           SLP Evaluation Cognition  Overall Cognitive Status: Within Functional Limits for tasks assessed Arousal/Alertness: Awake/alert Orientation Level: Oriented X4 Attention: Sustained Sustained Attention: Appears intact Memory: Appears intact Awareness: Appears intact Problem Solving: Appears intact Safety/Judgment: Appears intact       Comprehension  Auditory Comprehension Overall Auditory Comprehension: Appears within functional limits for tasks assessed Visual Recognition/Discrimination Discrimination: Not tested Reading Comprehension Reading Status: Not tested    Expression Expression Primary Mode of Expression:  Verbal Verbal Expression Overall Verbal Expression: Appears within functional limits for tasks assessed Initiation: No impairment Level of Generative/Spontaneous Verbalization: Conversation Repetition:  (NT) Naming: No impairment Pragmatics: No impairment Written Expression Dominant Hand: Right Written Expression: Not tested   Oral / Motor  Oral Motor/Sensory Function Overall Oral Motor/Sensory Function:  (slight left labial droop) Motor Speech Overall Motor Speech: Appears within functional limits for tasks assessed Respiration: Within functional limits Phonation: Normal Resonance: Within functional limits Articulation: Within functional limitis Intelligibility: Intelligible Motor Planning: Witnin functional limits   GO                    Houston Siren 07/29/2016, 9:56 AM  Orbie Pyo Yolandra Habig M.Ed [employer] [phone]

## 2016-07-29 NOTE — Progress Notes (Signed)
**  Preliminary report by tech**  Carotid artery duplex completed. Findings are consistent with a 1-39 percent stenosis involving the right internal carotid artery and the left internal carotid artery. The vertebral arteries demonstrate antegrade flow.  07/29/16 3:41 PM Thomas Burnett RVT

## 2016-07-29 NOTE — Progress Notes (Signed)
PT Cancellation Note  Patient Details Name: Thomas Burnett MRN: [id number] DOB: 24-Sep-1920   Cancelled Treatment:    Reason Eval/Treat Not Completed: Patient not medically ready; patient still on strict bedrest per RN.  Will attempt another day.   Reginia Naas 07/29/2016, 10:56 AM  Magda Kiel, [place] 07/29/2016

## 2016-07-29 NOTE — Progress Notes (Signed)
STROKE TEAM PROGRESS NOTE   HISTORY OF PRESENT ILLNESS (per record) Thomas Burnett is a 82 y.o. male with a history of hypertension who is only currently taking Lasix. He was in his normal state of health until 19:15 07/28/2016 (LKW) at which time he developed left-sided weakness. EMS responded and found him to have left-sided weakness and therefore called a code stroke and brought him into the emergency department. On their initial evaluation, he had blood pressures in the 220s. In CT, he was observed to have a small thalamic hemorrhage. He was given labetalol, and initially seemed to have a good response but subsequently his blood pressure went back up. ICH Score: 1. He was admitted to the neuro ICU for further evaluation and treatment.   SUBJECTIVE (INTERVAL HISTORY) Wife is at bedside. Pt stated that he still has left arm weakness and incoordination. He has left leg weakness at baseline and walk with walking but still drag and limp on the left. Not sure if left leg weaker after the [place] yet as he did not try to walk yet. Wife said he takes lasix for left leg swelling and he sleeps in chair every night. He also has Afib in the past, tried Xarelto by PCP Dr. Felipa Eth but developed rash and hives. Xarelto was stopped and no more blood thinners were given.    OBJECTIVE Temp:  [97.6 F (36.4 C)-98 F (36.7 C)] 97.6 F (36.4 C) (10/26 0400) Pulse Rate:  [25-112] 87 (10/26 0715) Cardiac Rhythm: Bundle branch block;Heart block (10/25 2335) Resp:  [15-28] 15 (10/26 0715) BP: (95-237)/(59-145) 123/65 (10/26 0715) SpO2:  [90 %-100 %] 93 % (10/26 0715) Weight:  [81.6 kg (180 lb)-84.5 kg (186 lb 4.6 oz)] 84.5 kg (186 lb 4.6 oz) (10/25 2345)  CBC:  Recent Labs Lab 07/28/16 2010 07/28/16 2023  WBC 7.6  --   NEUTROABS 4.3  --   HGB 15.8 16.0  HCT 46.3 47.0  MCV 93.2  --   PLT 162  --     Basic Metabolic Panel:  Recent Labs Lab 07/28/16 2010 07/28/16 2023  NA 139 141  K 3.8 3.7  CL  106 105  CO2 24  --   GLUCOSE 135* 131*  BUN 22* 25*  CREATININE 1.40* 1.40*  CALCIUM 9.6  --     Lipid Panel: No results found for: CHOL, TRIG, HDL, CHOLHDL, VLDL, LDLCALC HgbA1c: No results found for: HGBA1C Urine Drug Screen:    Component Value Date/Time   LABOPIA NONE DETECTED 07/28/2016 0546   COCAINSCRNUR NONE DETECTED 07/28/2016 0546   LABBENZ NONE DETECTED 07/28/2016 0546   AMPHETMU NONE DETECTED 07/28/2016 0546   THCU NONE DETECTED 07/28/2016 0546   LABBARB NONE DETECTED 07/28/2016 0546      IMAGING I have personally reviewed the radiological images below and agree with the radiology interpretations.  Ct Cervical Spine Wo Contrast 07/28/2016 No acute/traumatic cervical spine pathology. Multilevel degenerative changes.   Dg Chest Portable 1 View 07/28/2016 No acute abnormality noted.   Ct Head Code Stroke Wo Contrast 07/28/2016 1. 13 x 15 x 25 mm acute right thalamic parenchymal hemorrhage, estimated volume 2.4 cc. Mild localized edema without significant mass effect. No intraventricular extension. Hypertensive etiology is suspected. 2. Generalized age-related cerebral atrophy with chronic microvascular ischemic disease.   CUS pending  TTE pending  Repeat CT pending   PHYSICAL EXAM  Temp:  [97.6 F (36.4 C)-98 F (36.7 C)] 97.6 F (36.4 C) (10/26 0800) Pulse Rate:  [25-112] 87 (  10/26 0715) Resp:  [15-28] 15 (10/26 0715) BP: (95-237)/(59-145) 123/65 (10/26 0715) SpO2:  [90 %-100 %] 93 % (10/26 0715) Weight:  [180 lb (81.6 kg)-186 lb 4.6 oz (84.5 kg)] 186 lb 4.6 oz (84.5 kg) (10/25 2345)  General - Well nourished, well developed, in no apparent distress.  Ophthalmologic - Fundi not visualized due to noncooperation.  Cardiovascular - irregularly irregular heart rate and rhythm.  Mental Status -  Level of arousal and orientation to time, place, and person were intact. Language including expression, naming, repetition, comprehension was assessed and  found intact, mild dysarthria Fund of Knowledge was assessed and was intact.  Cranial Nerves II - XII - II - Visual field intact OU. III, IV, VI - Extraocular movements intact. V - Facial sensation intact bilaterally. VII - Facial movement intact bilaterally. VIII - Hearing & vestibular intact bilaterally. X - Palate elevates symmetrically. XI - Chin turning & shoulder shrug intact bilaterally. XII - Tongue protrusion intact.  Motor Strength - The patient's strength was normal in RUE and RLE, but 3-/5 LUE and 4/5 LLE .  Bulk was normal and fasciculations were absent.   Motor Tone - Muscle tone was assessed at the neck and appendages and was normal.  Reflexes - The patient's reflexes were 1+ in all extremities and he had no pathological reflexes.  Sensory - Light touch, temperature/pinprick were assessed and were decreased on the LUE.    Coordination - The patient had significant LUE ataxia.  Tremor was absent.  Gait and Station - not tested.   ASSESSMENT/PLAN Mr. Thomas Burnett is a 82 y.o. male with history of HTN presenting with L sided weakness and hypertensive emergency. CT showed R thalamic hemorrhage.   Stroke:  Non-dominant right small thalamic hemorrhage secondary to hypertensive emergency  Resultant  LUE weakness and incoordination  CT R thalamic hemorrhage, 2.4cc  Repeat CT head pending  Carotid Doppler  pending   2D Echo  pending   LDL pending   HgbA1c pending  SCDs for VTE prophylaxis  Diet regular Room service appropriate? Yes; Fluid consistency: Thin  aspirin 81 mg daily prior to admission. Had been in [place] for afib but stopped due to allergy. Now on no antithrombotics due to [place]. May consider eliquis in the future once ICH resolves.  Ongoing aggressive stroke risk factor management  Patient does not desire surgical procedures  Therapy recommendations:  pending   Disposition:  pending   Hypertensive Emergency  SBP 220s on arrival in  setting of neurologic emergency  Lasix at home  Unstable, on cleviprex now, taper off as able  Resume lasix and put on hydralazine  SBP goal < 160  Long-term BP goal normotensive  Chronic afib  Has tried Xarelto in the past, stopped due to rash and hives  No anticoagulation now due to [place]  May consider eliquis in the future once ICH resolves  Other Stroke Risk Factors  Advanced age  ETOH use, advised to drink no more than 2 drink(s) a day  Obesity, Body mass index is 30.07 kg/m., recommend weight loss, diet and exercise as appropriate   Other Active Problems  Stage 2 prostate cancer  RBBB   Baseline LLE weakness and walk with walker  Hospital day # 1  This patient is critically ill due to right thalamic ICH, hypertensive emergency, afib persistent and at significant risk of neurological worsening, death form hemorrhage extension, heart failure, afib RVR, ischemic infarct. This patient's care requires constant monitoring of vital signs,  hemodynamics, respiratory and cardiac monitoring, review of multiple databases, neurological assessment, discussion with family, other specialists and medical decision making of high complexity. I spent 40 minutes of neurocritical care time in the care of this patient.  Rosalin Hawking, MD PhD Stroke Neurology 07/29/2016 10:27 AM   To contact Stroke Continuity provider, please refer to [email or web address] After hours, contact General Neurology

## 2016-07-29 NOTE — Care Management Note (Signed)
Case Management Note  Patient Details  Name: Thomas Burnett MRN: [id number] Date of Birth: 1920-01-17  Subjective/Objective:  Pt admitted on 07/28/16 with thalamic hemorrhage.  PTA, pt independent, lives with spouse Allen.                    Action/Plan: Pt on bedrest today; plan PT/OT on 10/27.  Will follow for discharge planning as pt progresses.    Expected Discharge Date:                  Expected Discharge Plan:  [place]  In-House Referral:     Discharge planning Services  CM Consult  Post Acute Care Choice:    Choice offered to:     DME Arranged:    DME Agency:     HH Arranged:    [place] Agency:     Status of Service:  In process, will continue to follow  If discussed at Long Length of Stay Meetings, dates discussed:    Additional Comments:  Reinaldo Raddle, RN, BSN  Trauma/Neuro ICU Case Manager [phone]

## 2016-07-29 NOTE — Progress Notes (Signed)
OT Cancellation Note  Patient Details Name: CALIAN HORNG MRN: [id number] DOB: 28-Mar-1920   Cancelled Treatment:    Reason Eval/Treat Not Completed: Patient not medically ready (on bedrest)  [place], OTR/L  [phone] 07/29/2016 07/29/2016, 6:57 AM

## 2016-07-30 DIAGNOSIS — G8194 Hemiplegia, unspecified affecting left nondominant side: Secondary | ICD-10-CM

## 2016-07-30 DIAGNOSIS — E785 Hyperlipidemia, unspecified: Secondary | ICD-10-CM

## 2016-07-30 DIAGNOSIS — I610 Nontraumatic intracerebral hemorrhage in hemisphere, subcortical: Secondary | ICD-10-CM

## 2016-07-30 LAB — CBC
HCT: 48.3 % (ref 39.0–52.0)
Hemoglobin: 16.3 g/dL (ref 13.0–17.0)
MCH: 31.4 pg (ref 26.0–34.0)
MCHC: 33.7 g/dL (ref 30.0–36.0)
MCV: 93.1 fL (ref 78.0–100.0)
PLATELETS: 180 10*3/uL (ref 150–400)
RBC: 5.19 MIL/uL (ref 4.22–5.81)
RDW: 14.1 % (ref 11.5–15.5)
WBC: 9 10*3/uL (ref 4.0–10.5)

## 2016-07-30 LAB — BASIC METABOLIC PANEL
ANION GAP: 8 (ref 5–15)
Anion gap: 6 (ref 5–15)
BUN: 20 mg/dL (ref 6–20)
BUN: 20 mg/dL (ref 6–20)
CALCIUM: 9.4 mg/dL (ref 8.9–10.3)
CALCIUM: 9.8 mg/dL (ref 8.9–10.3)
CO2: 25 mmol/L (ref 22–32)
CO2: 27 mmol/L (ref 22–32)
CREATININE: 1.02 mg/dL (ref 0.61–1.24)
CREATININE: 1.07 mg/dL (ref 0.61–1.24)
Chloride: 105 mmol/L (ref 101–111)
Chloride: 108 mmol/L (ref 101–111)
GFR calc non Af Amer: >60 mL/min (ref >60)
GFR, EST NON AFRICAN AMERICAN: 57 mL/min — AB (ref >60)
Glucose, Bld: 135 mg/dL — ABNORMAL HIGH (ref 65–99)
Glucose, Bld: 149 mg/dL — ABNORMAL HIGH (ref 65–99)
Potassium: 3.6 mmol/L (ref 3.5–5.1)
Potassium: 3.8 mmol/L (ref 3.5–5.1)
SODIUM: 139 mmol/L (ref 135–145)
Sodium: 140 mmol/L (ref 135–145)

## 2016-07-30 LAB — MAGNESIUM: Magnesium: 1.8 mg/dL (ref 1.7–2.4)

## 2016-07-30 LAB — HEMOGLOBIN A1C
HEMOGLOBIN A1C: 5.8 % — AB (ref 4.8–5.6)
MEAN PLASMA GLUCOSE: 120 mg/dL

## 2016-07-30 LAB — TRIGLYCERIDES: Triglycerides: 85 mg/dL (ref <150)

## 2016-07-30 MED ORDER — ENOXAPARIN SODIUM 40 MG/0.4ML ~~LOC~~ SOLN
40.0000 mg | SUBCUTANEOUS | Status: DC
  Administered 2016-07-30 – 2016-08-02 (×4): 40 mg via SUBCUTANEOUS
  Filled 2016-07-30 (×4): qty 0.4

## 2016-07-30 MED ORDER — PRAVASTATIN SODIUM 40 MG PO TABS
40.0000 mg | ORAL_TABLET | Freq: Every day | ORAL | Status: DC
  Administered 2016-07-31 – 2016-08-01 (×2): 40 mg via ORAL
  Filled 2016-07-30 (×2): qty 1

## 2016-07-30 NOTE — Progress Notes (Signed)
Rehab Admissions Coordinator Note:  Patient was screened by Retta Diones for appropriateness for an Inpatient Acute Rehab Consult.  At this time, we are recommending Inpatient Rehab consult.  Retta Diones 07/30/2016, 3:47 PM  I can be reached at [phone].

## 2016-07-30 NOTE — Consult Note (Signed)
Physical Medicine and Rehabilitation Consult  Reason for Consult:left sided weakness   Referring Physician: Erlinda Hong   HPI: Thomas Burnett is a 82 y.o. male with history of HTN, prostate cancer who was admitted on 07/28/16 with left sided weakness and elevated BP.  CT head done revealing right internal capsule and thalamic hemorrhage. He was started on cleviprex for BP goal < 160. IV labetalol and    Review of Systems  Constitutional: Negative for fever.  Eyes: Positive for double vision.  Respiratory: Negative for cough.   Cardiovascular: Negative for chest pain.  Gastrointestinal: Negative for heartburn.  Genitourinary: Negative for dysuria.  Musculoskeletal: Negative for myalgias.  Skin: Negative for rash.  Neurological: Positive for focal weakness and headaches.  Psychiatric/Behavioral: Negative for depression.   Past Medical History:  Diagnosis Date  . Hypertension   . Mild aortic insufficiency   . Mild aortic sclerosis ([place])   . Obesity   . Osteopenia   . Prostate cancer ([place])    STAGE 2  . Right bundle branch block (RBBB)   . Vitamin D deficiency    History reviewed. No pertinent surgical history. History reviewed. No pertinent family history. Social History:  reports that he has quit smoking. He has never used smokeless tobacco. He reports that he drinks alcohol. He reports that he does not use drugs. Allergies:  Allergies  Allergen Reactions  . Xarelto [Rivaroxaban] Hives   Medications Prior to Admission  Medication Sig Dispense Refill  . aspirin EC 81 MG tablet Take 81 mg by mouth once.    . Cholecalciferol (VITAMIN D-3) 1000 UNITS CAPS Take 1,000 Units by mouth daily.     Marland Kitchen FLUZONE HIGH-DOSE 0.5 ML SUSY Inject 1 application into the muscle once.  0  . furosemide (LASIX) 20 MG tablet Take 20 mg by mouth daily.  6  . loratadine (CLARITIN) 10 MG tablet Take 10 mg by mouth daily.    . naproxen sodium (ANAPROX) 220 MG tablet Take 440 mg by mouth at  bedtime as needed (TAKES MOST NIGHTS).       Home: Home Living Family/patient expects to be discharged to:: Inpatient rehab Living Arrangements: Spouse/significant other Available Help at Discharge: Family, Personal care attendant, Available 24 hours/day Type of Home: House Home Access: Level entry Home Layout: One level Bathroom Shower/Tub: Tub/shower unit, Walk-in shower (pt uses tub) Bathroom Toilet: Standard Home Equipment: Environmental consultant - 4 wheels, Grab bars - tub/shower  Lives With: Spouse  Functional History: Prior Function Level of Independence: Independent with assistive device(s) Comments: rollator for all mobility. Independent with BADL Functional Status:  Mobility: Bed Mobility General bed mobility comments: Pt OOB in chair upon arrival Transfers Overall transfer level: Needs assistance Equipment used: Rolling walker (2 wheeled) Transfers: Sit to/from Stand Sit to Stand: Max assist, +2 physical assistance General transfer comment: Max assist +2 to boost up from chair. Cues for hand placement and facilitation of LUE to hold onto RW during transfer. Ambulation/Gait General Gait Details: unable to ambulate, pre-gait only    ADL: ADL Overall ADL's : Needs assistance/impaired Grooming: Minimal assistance, Sitting Upper Body Bathing: Moderate assistance, Sitting Lower Body Bathing: +2 for physical assistance, Sit to/from stand, Maximal assistance Upper Body Dressing : Moderate assistance, Sitting Upper Body Dressing Details (indicate cue type and reason): to don hospital gown Lower Body Dressing: Maximal assistance, +2 for physical assistance, Sit to/from stand Functional mobility during ADLs: Maximal assistance, +2 for physical assistance (for sit to stand only) General  ADL Comments: Pt required consistent verbal cues for upright posture in standing and to facilitate weight shift to R side. Educated pt and family on protection of LUE and keeping it on pillow for  safety.  Cognition: Cognition Overall Cognitive Status: Within Functional Limits for tasks assessed Arousal/Alertness: Awake/alert Orientation Level: Oriented X4 Attention: Sustained Sustained Attention: Appears intact Memory: Appears intact Awareness: Appears intact Problem Solving: Appears intact Safety/Judgment: Appears intact Cognition Arousal/Alertness: Awake/alert Behavior During Therapy: Impulsive Overall Cognitive Status: Within Functional Limits for tasks assessed  Blood pressure (!) 148/135, pulse 97, temperature 98.1 F (36.7 C), temperature source Oral, resp. rate 20, height 5\' 6"  (1.676 m), weight 84.5 kg (186 lb 4.6 oz), SpO2 94 %. Physical Exam  Constitutional: He is oriented to person, place, and time. He appears well-developed.  HENT:  Head: Normocephalic.  Eyes: Pupils are equal, round, and reactive to light.  Neck: Neck supple.  Cardiovascular: Normal rate and intact distal pulses.   Respiratory: No respiratory distress. He has no wheezes.  GI: He exhibits no distension. There is no tenderness.  Musculoskeletal: Normal range of motion. He exhibits deformity. He exhibits no edema.  Neurological: He is alert and oriented to person, place, and time.  Limb ataxia LUE and LLE, decreased FMC. LUE motor 4/5 prox to distal. LLE: 2/5 hf,ke and 3/5 adf/pf  Skin: Skin is warm and dry.  Scattered echymoses  Psychiatric: He has a normal mood and affect. His behavior is normal. Thought content normal.    Results for orders placed or performed during the hospital encounter of 07/28/16 (from the past 24 hour(s))  Basic metabolic panel     Status: Abnormal   Collection Time: 07/30/16 12:01 AM  Result Value Ref Range   Sodium 139 135 - 145 mmol/L   Potassium 3.6 3.5 - 5.1 mmol/L   Chloride 108 101 - 111 mmol/L   CO2 25 22 - 32 mmol/L   Glucose, Bld 149 (H) 65 - 99 mg/dL   BUN 20 6 - 20 mg/dL   Creatinine, Ser 1.02 0.61 - 1.24 mg/dL   Calcium 9.4 8.9 - 10.3 mg/dL    GFR calc non Af Amer >60 >60 mL/min   GFR calc Af Amer >60 >60 mL/min   Anion gap 6 5 - 15  Magnesium     Status: None   Collection Time: 07/30/16 12:01 AM  Result Value Ref Range   Magnesium 1.8 1.7 - 2.4 mg/dL  CBC     Status: None   Collection Time: 07/30/16  5:06 AM  Result Value Ref Range   WBC 9.0 4.0 - 10.5 K/uL   RBC 5.19 4.22 - 5.81 MIL/uL   Hemoglobin 16.3 13.0 - 17.0 g/dL   HCT 48.3 39.0 - 52.0 %   MCV 93.1 78.0 - 100.0 fL   MCH 31.4 26.0 - 34.0 pg   MCHC 33.7 30.0 - 36.0 g/dL   RDW 14.1 11.5 - 15.5 %   Platelets 180 150 - 400 K/uL  Basic metabolic panel     Status: Abnormal   Collection Time: 07/30/16  5:06 AM  Result Value Ref Range   Sodium 140 135 - 145 mmol/L   Potassium 3.8 3.5 - 5.1 mmol/L   Chloride 105 101 - 111 mmol/L   CO2 27 22 - 32 mmol/L   Glucose, Bld 135 (H) 65 - 99 mg/dL   BUN 20 6 - 20 mg/dL   Creatinine, Ser 1.07 0.61 - 1.24 mg/dL   Calcium 9.8 8.9 -  10.3 mg/dL   GFR calc non Af Amer 57 (L) >60 mL/min   GFR calc Af Amer >60 >60 mL/min   Anion gap 8 5 - 15  Triglycerides     Status: None   Collection Time: 07/30/16  5:07 AM  Result Value Ref Range   Triglycerides 85 <150 mg/dL   Ct Head Wo Contrast  Result Date: 07/29/2016 CLINICAL DATA:  Followup intracranial hemorrhage. Left-sided weakness EXAM: CT HEAD WITHOUT CONTRAST TECHNIQUE: Contiguous axial images were obtained from the base of the skull through the vertex without intravenous contrast. COMPARISON:  Yesterday FINDINGS: Brain: Unchanged ovoid hematoma in the right internal capsule and lateral thalamus measuring 25 mm in length and 15 mm in thickness. No new site of hemorrhage. No acute infarct. Atrophy that is moderate and fairly generalized. Chronic small vessel ischemic change in the periventricular white matter. Vascular: Atherosclerotic calcification Skull: Negative for fracture. Sinuses/Orbits: Negative IMPRESSION: Unchanged right internal capsule and thalamic hematoma. No significant  mass effect. No new abnormality. Electronically Signed   By: Monte Fantasia M.D.   On: 07/29/2016 16:28   Ct Cervical Spine Wo Contrast  Result Date: 07/28/2016 CLINICAL DATA:  82 year old male with hemorrhagic stroke and fall. EXAM: CT CERVICAL SPINE WITHOUT CONTRAST TECHNIQUE: Multidetector CT imaging of the cervical spine was performed without intravenous contrast. Multiplanar CT image reconstructions were also generated. COMPARISON:  Head CT dated 07/28/2016 FINDINGS: Alignment: No acute subluxation. Skull base and vertebrae: No acute fracture. The bones are osteopenic which limits evaluation for fracture. There is ankylosis of the C2 and C3 vertebra. Soft tissues and spinal canal: No prevertebral fluid or swelling. No visible canal hematoma. Disc levels: There multilevel disc disease with vacuum phenomena. There is grade 1 C5-C6 anterolisthesis and grade 1 T1-T2 anterolisthesis. Degenerative this disease most prominent at C6-C7 where there is disc space narrowing and endplate irregularity and osteophyte formation. There is multilevel facet hypertrophy most prominent at C4-C5, and C5-C6 with associated neural foramina narrowing. Upper chest: There is morphologic changes of tracheomalacia. The visualized lung apices are clear. Other: None IMPRESSION: No acute/traumatic cervical spine pathology. Multilevel degenerative changes. Electronically Signed   By: Anner Crete M.D.   On: 07/28/2016 23:28   Dg Chest Portable 1 View  Result Date: 07/28/2016 CLINICAL DATA:  Recent fall EXAM: PORTABLE CHEST 1 VIEW COMPARISON:  None. FINDINGS: Cardiac shadow is mildly enlarged. Aortic calcifications are seen. No focal infiltrate or sizable effusion is noted. No acute bony abnormality is seen. No pneumothorax is noted. IMPRESSION: No acute abnormality noted. Electronically Signed   By: Inez Catalina M.D.   On: 07/28/2016 21:03   Ct Head Code Stroke Wo Contrast  Result Date: 07/28/2016 CLINICAL DATA:  Code  stroke. Initial evaluation for acute left-sided weakness, slurred speech. EXAM: CT HEAD WITHOUT CONTRAST TECHNIQUE: Contiguous axial images were obtained from the base of the skull through the vertex without intravenous contrast. COMPARISON:  None available. FINDINGS: Brain: Diffuse prominence of the CSF containing spaces compatible with generalized cerebral atrophy. Moderate chronic microvascular ischemic disease. There is an acute intraparenchymal hemorrhage centered at the right thalamus that measures 13 x 15 x 25 mm (estimated volume 2.4 cc). Mild localized edema without significant mass effect. No intraventricular extension into the adjacent right lateral ventricle. A hypertensive etiology is suspected. The no other acute intracranial hemorrhage. No evidence for acute large vessel territory infarct. No mass lesion, midline shift or mass effect. Ventricular prominence related to global parenchymal volume loss of hydrocephalus. No extra-axial  fluid collection. Vascular: No hyperdense vessel. Prominent vascular calcifications noted within the carotid siphons and distal left vertebral artery. Skull: Scalp soft tissues within normal limits.  Calvarium intact. Sinuses/Orbits: The globes and orbital soft tissues within normal limits. Senescent ocular calcifications noted. Paranasal sinuses are clear. Small bilateral mastoid effusions noted, of doubtful clinical significance. IMPRESSION: 1. 13 x 15 x 25 mm acute right thalamic parenchymal hemorrhage, estimated volume 2.4 cc. Mild localized edema without significant mass effect. No intraventricular extension. Hypertensive etiology is suspected. 2. Generalized age-related cerebral atrophy with chronic microvascular ischemic disease. The covering stroke neurologist has been paged at the time of this dictation (approximately 8:50 p.m. on 07/28/2016). Currently awaiting a call back. Findings will be conveyed as soon as possible. Electronically Signed   By: Jeannine Boga M.D.   On: 07/28/2016 20:55    Assessment/Plan: Diagnosis: Right internal capsule/thalamic hemorrhage 1. Does the need for close, 24 hr/day medical supervision in concert with the patient's rehab needs make it unreasonable for this patient to be served in a less intensive setting? Yes 2. Co-Morbidities requiring supervision/potential complications: post hemorrhage sequelae 3. Due to bladder management, bowel management, safety, skin/wound care, disease management, medication administration, pain management and patient education, does the patient require 24 hr/day rehab nursing? Yes 4. Does the patient require coordinated care of a physician, rehab nurse, PT (1-2 hrs/day, 5 days/week), OT (1-2 hrs/day, 5 days/week) and SLP (1-2 hrs/day, 5 days/week) to address physical and functional deficits in the context of the above medical diagnosis(es)? Yes Addressing deficits in the following areas: balance, endurance, locomotion, strength, transferring, bowel/bladder control, bathing, dressing, feeding, grooming, toileting, speech and psychosocial support 5. Can the patient actively participate in an intensive therapy program of at least 3 hrs of therapy per day at least 5 days per week? Yes 6. The potential for patient to make measurable gains while on inpatient rehab is excellent 7. Anticipated functional outcomes upon discharge from inpatient rehab are supervision and min assist  with PT, supervision and min assist with OT, modified independent with SLP. 8. Estimated rehab length of stay to reach the above functional goals is: 18-20 days 9. Does the patient have adequate social supports and living environment to accommodate these discharge functional goals? Yes 10. Anticipated D/C setting: Home 11. Anticipated post D/C treatments: HH therapy and Outpatient therapy 12. Overall Rehab/Functional Prognosis: excellent  RECOMMENDATIONS: This patient's condition is appropriate for continued  rehabilitative care in the following setting: CIR Patient has agreed to participate in recommended program. Yes Note that insurance prior authorization may be required for reimbursement for recommended care.  Comment: Rehab Admissions Coordinator to follow up.  Thanks,  Meredith Staggers, MD, Mellody Drown     07/30/2016

## 2016-07-30 NOTE — Evaluation (Addendum)
Physical Therapy Evaluation Patient Details Name: Thomas Burnett MRN: [id number] DOB: 1920-09-09 Today's Date: 07/30/2016   History of Present Illness  82 y.o.malewith a history of hypertension presenting with left sided weakness. Upon EMS evaluation, pt with blood pressure in 220s. CT on 10/25 positive for acute right thalamic hemorrhage.  Clinical Impression  Patient presents with decreased mobility due to deficits listed in PT problem list.  He will benefit from skilled PT in the acute setting to allow return home with family support following CIR level rehab stay. Patient demonstrating decreased postural awareness, decreased L U/LE strength and coordination, poor tolerance to mobility with L LE pain, and currently unable to ambulate with +2 A.    Follow Up Recommendations CIR    Equipment Recommendations  Other (comment) (TBA)    Recommendations for Other Services Rehab consult     Precautions / Restrictions Precautions Precautions: Fall Precaution Comments: Skin tear on L elbow from fall Restrictions Weight Bearing Restrictions: No      Mobility  Bed Mobility               General bed mobility comments: Pt OOB in chair upon arrival  Transfers Overall transfer level: Needs assistance Equipment used: Rolling walker (2 wheeled) Transfers: Sit to/from Stand Sit to Stand: Max assist;+2 physical assistance         General transfer comment: Max assist +2 to boost up from chair. Cues for hand placement and facilitation of LUE to hold onto RW during transfer.  Ambulation/Gait             General Gait Details: unable to ambulate, pre-gait only  Stairs            Wheelchair Mobility    Modified Rankin (Stroke Patients Only) Modified Rankin (Stroke Patients Only) Pre-Morbid Rankin Score: Moderate disability Modified Rankin: Moderately severe disability     Balance Overall balance assessment: Needs assistance Sitting-balance support: Feet  supported;Bilateral upper extremity supported Sitting balance-Leahy Scale: Poor Sitting balance - Comments: pushing posterior sitting on edge of chair Postural control: Posterior lean;Left lateral lean Standing balance support: Bilateral upper extremity supported Standing balance-Leahy Scale: Poor Standing balance comment: max +2 A for static standing wtih RW; stood about 4 minutes with lateral weight shifts and cues to lean to R; eventually able to step L foot forward and back several times, but unable to move R foot due to L weakness                             Pertinent Vitals/Pain Pain Assessment: Faces Faces Pain Scale: Hurts even more Pain Location: L thigh with movement Pain Descriptors / Indicators: Grimacing;Guarding Pain Intervention(s): Monitored during session;Repositioned    Home Living Family/patient expects to be discharged to:: Inpatient rehab Living Arrangements: Spouse/significant other Available Help at Discharge: Family;Personal care attendant;Available 24 hours/day Type of Home: House Home Access: Level entry     Home Layout: One level Home Equipment: Walker - 4 wheels;Grab bars - tub/shower      Prior Function Level of Independence: Independent with assistive device(s)         Comments: rollator for all mobility. Independent with BADL     Hand Dominance   Dominant Hand: Right    Extremity/Trunk Assessment   Upper Extremity Assessment: Defer to OT evaluation       LUE Deficits / Details: Grossly 3+/5. Poor gross motor coordination with ataxic movement. Pt does report impaired sensation.  Lower Extremity Assessment: LLE deficits/detail   LLE Deficits / Details: hip flexion 3/5, knee extension 4-/5, ankle DF 4-/5  Cervical / Trunk Assessment: Kyphotic  Communication   Communication: No difficulties  Cognition Arousal/Alertness: Awake/alert Behavior During Therapy: Impulsive Overall Cognitive Status: Within Functional Limits  for tasks assessed                      General Comments      Exercises     Assessment/Plan    PT Assessment Patient needs continued PT services  PT Problem List Decreased strength;Decreased activity tolerance;Decreased balance;Decreased mobility;Decreased coordination;Decreased knowledge of use of DME;Decreased safety awareness;Decreased knowledge of precautions          PT Treatment Interventions DME instruction;Gait training;Therapeutic activities;Therapeutic exercise;Patient/family education;Functional mobility training;Balance training    PT Goals (Current goals can be found in the Care Plan section)  Acute Rehab PT Goals Patient Stated Goal: get back to being independent PT Goal Formulation: With patient/family Time For Goal Achievement: 08/13/16 Potential to Achieve Goals: Fair    Frequency Min 4X/week   Barriers to discharge        Co-evaluation PT/OT/SLP Co-Evaluation/Treatment: Yes Reason for Co-Treatment: Complexity of the patient's impairments (multi-system involvement);For patient/therapist safety PT goals addressed during session: Mobility/safety with mobility;Balance OT goals addressed during session: Strengthening/ROM       End of Session Equipment Utilized During Treatment: Gait belt Activity Tolerance: Patient tolerated treatment well Patient left: in chair;with call bell/phone within reach Nurse Communication: Mobility status         Time: [id number] PT Time Calculation (min) (ACUTE ONLY): 32 min   Charges:   PT Evaluation $PT Eval Moderate Complexity: 1 Procedure     PT G CodesReginia Naas Aug 23, 2016, 2:36 PM  Magda Kiel, [place] 08-23-16

## 2016-07-30 NOTE — Evaluation (Signed)
Occupational Therapy Evaluation Patient Details Name: Thomas Burnett MRN: [id number] DOB: 01-26-1920 Today's Date: 07/30/2016    History of Present Illness 82 y.o.malewith a history of hypertension presenting with left sided weakness. Upon EMS evaluation, pt with blood pressure in 220s. CT on 10/25 positive for acute right thalamic hemorrhage.   Clinical Impression   Pt reports he was independent with ADL PTA. Currently pt requires max assist +2 for sit to stand from chair with max verbal and tactile cues to facilitate upright posture and weight shift. Pt requires max assist overall for ADL at this time. Pt presenting with LUE decreased strength/coorination and poor balance in sitting and standing impacting his independence and safety with ADL and functional mobility. Pt eager to return to functional independence with supportive wife present on OT eval. Recommending CIR level therapies to maximize independence and safety with ADL and functional mobility prior to return home. Pt would benefit from continued skilled OT to address established goals.   Follow Up Recommendations  CIR;Supervision/Assistance - 24 hour    Equipment Recommendations  Other (comment) (TBD at next venue)    Recommendations for Other Services Rehab consult     Precautions / Restrictions Precautions Precautions: Fall Precaution Comments: Skin tear on L elbow from fall Restrictions Weight Bearing Restrictions: No      Mobility Bed Mobility               General bed mobility comments: Pt OOB in chair upon arrival  Transfers Overall transfer level: Needs assistance Equipment used: Rolling walker (2 wheeled) Transfers: Sit to/from Stand Sit to Stand: Max assist;+2 physical assistance         General transfer comment: Max assist +2 to boost up from chair. Cues for hand placement and facilitation of LUE to hold onto RW during transfer.    Balance Overall balance assessment: Needs  assistance Sitting-balance support: Feet supported;Bilateral upper extremity supported Sitting balance-Leahy Scale: Poor     Standing balance support: Bilateral upper extremity supported Standing balance-Leahy Scale: Poor Standing balance comment: Bil UE supported with max assist +2 to maintain upright posture.                            ADL Overall ADL's : Needs assistance/impaired     Grooming: Minimal assistance;Sitting   Upper Body Bathing: Moderate assistance;Sitting   Lower Body Bathing: +2 for physical assistance;Sit to/from stand;Maximal assistance   Upper Body Dressing : Moderate assistance;Sitting Upper Body Dressing Details (indicate cue type and reason): to don hospital gown Lower Body Dressing: Maximal assistance;+2 for physical assistance;Sit to/from stand               Functional mobility during ADLs: Maximal assistance;+2 for physical assistance (for sit to stand only) General ADL Comments: Pt required consistent verbal cues for upright posture in standing and to facilitate weight shift to R side. Educated pt and family on protection of LUE and keeping it on pillow for safety.     Vision Additional Comments: Needs further assessment.   Perception     Praxis      Pertinent Vitals/Pain Pain Assessment: Faces Faces Pain Scale: Hurts even more Pain Location: L thigh with movement Pain Descriptors / Indicators: Grimacing;Guarding Pain Intervention(s): Monitored during session     Hand Dominance Right   Extremity/Trunk Assessment Upper Extremity Assessment Upper Extremity Assessment: LUE deficits/detail LUE Deficits / Details: Grossly 3+/5. Poor gross motor coordination with ataxic movement. Pt does  report impaired sensation.  LUE Sensation: decreased light touch LUE Coordination: decreased fine motor;decreased gross motor   Lower Extremity Assessment Lower Extremity Assessment: Defer to PT evaluation   Cervical / Trunk  Assessment Cervical / Trunk Assessment: Kyphotic   Communication Communication Communication: No difficulties   Cognition Arousal/Alertness: Awake/alert Behavior During Therapy: Impulsive Overall Cognitive Status: Within Functional Limits for tasks assessed                     General Comments       Exercises       Shoulder Instructions      Home Living Family/patient expects to be discharged to:: Private residence Living Arrangements: Spouse/significant other Available Help at Discharge: Family;Personal care attendant;Available 24 hours/day Type of Home: House Home Access: Level entry     Home Layout: One level     Bathroom Shower/Tub: Tub/shower unit;Walk-in shower (pt typically uses tub shower)   Bathroom Toilet: Standard     Home Equipment: Walker - 4 wheels;Grab bars - tub/shower          Prior Functioning/Environment Level of Independence: Independent with assistive device(s)        Comments: rollator for all mobility. Independent with BADL        OT Problem List: Decreased strength;Decreased range of motion;Decreased activity tolerance;Impaired balance (sitting and/or standing);Decreased coordination;Decreased safety awareness;Decreased knowledge of use of DME or AE;Decreased knowledge of precautions;Impaired sensation;Impaired tone;Impaired UE functional use;Pain   OT Treatment/Interventions: Self-care/ADL training;Therapeutic exercise;Neuromuscular education;Energy conservation;DME and/or AE instruction;Therapeutic activities;Patient/family education;Balance training    OT Goals(Current goals can be found in the care plan section) Acute Rehab OT Goals Patient Stated Goal: get back to being independent OT Goal Formulation: With patient/family Time For Goal Achievement: 08/13/16 Potential to Achieve Goals: Good ADL Goals Pt Will Perform Grooming: with min assist;standing Pt Will Perform Upper Body Bathing: with min guard assist;sitting Pt  Will Perform Lower Body Bathing: with min assist;sit to/from stand Pt Will Transfer to Toilet: with mod assist;ambulating;bedside commode Pt/caregiver will Perform Home Exercise Program: Increased strength;Left upper extremity;Independently;With written HEP provided (increase fine/gross motor coordination)  OT Frequency: Min 2X/week   Barriers to D/C:            Co-evaluation PT/OT/SLP Co-Evaluation/Treatment: Yes Reason for Co-Treatment: Complexity of the patient's impairments (multi-system involvement);For patient/therapist safety   OT goals addressed during session: Strengthening/ROM      End of Session Equipment Utilized During Treatment: Gait belt;Rolling walker Nurse Communication: Mobility status  Activity Tolerance: Patient tolerated treatment well Patient left: in chair;with call bell/phone within reach;with family/visitor present   Time: [id number] OT Time Calculation (min): 32 min Charges:  OT General Charges $OT Visit: 1 Procedure OT Evaluation $OT Eval Moderate Complexity: 1 Procedure G-Codes:      Binnie Kand M.S., OTR/L Pager: [phone]  07/30/2016, 2:11 PM

## 2016-07-30 NOTE — Progress Notes (Signed)
STROKE TEAM PROGRESS NOTE   HISTORY OF PRESENT ILLNESS (per record) Thomas Burnett is a 82 y.o. male with a history of hypertension who is only currently taking Lasix. He was in his normal state of health until 19:15 07/28/2016 (LKW) at which time he developed left-sided weakness. EMS responded and found him to have left-sided weakness and therefore called a code stroke and brought him into the emergency department. On their initial evaluation, he had blood pressures in the 220s. In CT, he was observed to have a small thalamic hemorrhage. He was given labetalol, and initially seemed to have a good response but subsequently his blood pressure went back up. ICH Score: 1. He was admitted to the neuro ICU for further evaluation and treatment.   SUBJECTIVE (INTERVAL HISTORY) No family is at bedside. Pt is off cleviprex drip and BP in good control. On lasix, hydralazine and lisinopril. He is sitting in chair for breakfast this morning. Still has left arm weakness. Repeat CT stable hematoma and will transfer to floor.   I later met wife in [facility] and Updated her about treatment plan and current status.   OBJECTIVE Temp:  [97.4 F (36.3 C)-98.3 F (36.8 C)] 98.3 F (36.8 C) (10/27 0800) Pulse Rate:  [27-104] 90 (10/27 0800) Cardiac Rhythm: Heart block (10/27 0800) Resp:  [14-30] 23 (10/27 0800) BP: (82-173)/(51-152) 172/113 (10/27 0800) SpO2:  [92 %-100 %] 96 % (10/27 0800)  CBC:  Recent Labs Lab 07/28/16 2010  07/29/16 1012 07/30/16 0506  WBC 7.6  --  8.2 9.0  NEUTROABS 4.3  --   --   --   HGB 15.8  < > 15.0 16.3  HCT 46.3  < > 44.2 48.3  MCV 93.2  --  94.4 93.1  PLT 162  --  168 180  < > = values in this interval not displayed.  Basic Metabolic Panel:   Recent Labs Lab 07/30/16 0001 07/30/16 0506  NA 139 140  K 3.6 3.8  CL 108 105  CO2 25 27  GLUCOSE 149* 135*  BUN 20 20  CREATININE 1.02 1.07  CALCIUM 9.4 9.8  MG 1.8  --     Lipid Panel:     Component Value Date/Time    CHOL 220 (H) 07/29/2016 1012   TRIG 85 07/30/2016 0507   HDL 61 07/29/2016 1012   CHOLHDL 3.6 07/29/2016 1012   VLDL 13 07/29/2016 1012   LDLCALC 146 (H) 07/29/2016 1012   HgbA1c:  Lab Results  Component Value Date   HGBA1C 5.8 (H) 07/29/2016   Urine Drug Screen:     Component Value Date/Time   LABOPIA NONE DETECTED 07/28/2016 0546   COCAINSCRNUR NONE DETECTED 07/28/2016 0546   LABBENZ NONE DETECTED 07/28/2016 0546   AMPHETMU NONE DETECTED 07/28/2016 0546   THCU NONE DETECTED 07/28/2016 0546   LABBARB NONE DETECTED 07/28/2016 0546      IMAGING I have personally reviewed the radiological images below and agree with the radiology interpretations.  Ct Cervical Spine Wo Contrast 07/28/2016 No acute/traumatic cervical spine pathology. Multilevel degenerative changes.   Dg Chest Portable 1 View 07/28/2016 No acute abnormality noted.   Ct Head Code Stroke Wo Contrast 07/28/2016 1. 13 x 15 x 25 mm acute right thalamic parenchymal hemorrhage, estimated volume 2.4 cc. Mild localized edema without significant mass effect. No intraventricular extension. Hypertensive etiology is suspected. 2. Generalized age-related cerebral atrophy with chronic microvascular ischemic disease.   CUS  07/29/2016 Summary: Findings are consistent with a 1-39  percent stenosis involving the right internal carotid artery and the left internal carotid artery. The vertebral arteries demonstrate antegrade flow.  TTE  07/29/2016 Study Conclusions - Left ventricle: The cavity size was normal. Wall thickness was   increased in a pattern of moderate LVH. Systolic function was   normal. The estimated ejection fraction was in the range of 55%   to 60%. Regional wall motion abnormalities cannot be excluded.   Doppler parameters are consistent with abnormal left ventricular   relaxation (grade 1 diastolic dysfunction). Doppler parameters   are consistent with high ventricular filling pressure. - Aortic  valve: Valve mobility was restricted. There was moderate   stenosis. Valve area (VTI): 1.49 cm^2. Valve area (Vmax): 1.27   cm^2. Valve area (Vmean): 1.29 cm^2. - Mitral valve: Calcified annulus. There was mild regurgitation. - Left atrium: The atrium was mildly dilated. Impressions: - Technically difficult; normal LV systolic function; grade 1   diastolic dysfunction; elevated LV filling pressure; calcified   aortic valve with moderate AS (mean gradient 24 mmHg); mild MR;   mild LAE.  Repeat CT  07/29/2016 Unchanged right internal capsule and thalamic hematoma. No significant mass effect. No new abnormality.   PHYSICAL EXAM  Temp:  [97.4 F (36.3 C)-98.3 F (36.8 C)] 98.3 F (36.8 C) (10/27 0800) Pulse Rate:  [27-104] 90 (10/27 0800) Resp:  [14-30] 23 (10/27 0800) BP: (82-173)/(51-152) 172/113 (10/27 0800) SpO2:  [92 %-100 %] 96 % (10/27 0800)  General - Well nourished, well developed, in no apparent distress.  Ophthalmologic - Fundi not visualized due to noncooperation.  Cardiovascular - irregularly irregular heart rate and rhythm.  Mental Status -  Level of arousal and orientation to time, place, and person were intact. Language including expression, naming, repetition, comprehension was assessed and found intact, mild dysarthria. Fund of Knowledge was assessed and was intact.  Cranial Nerves II - XII - II - Visual field intact OU. III, IV, VI - Extraocular movements intact. V - Facial sensation intact bilaterally. VII - mild left facial droop. VIII - Hearing & vestibular intact bilaterally. X - Palate elevates symmetrically, mild dysarthria. XI - Chin turning & shoulder shrug intact bilaterally. XII - Tongue protrusion intact.  Motor Strength - The patient's strength was normal in RUE and RLE, but 3/5 LUE and 4/5 LLE .  Bulk was normal and fasciculations were absent.   Motor Tone - Muscle tone was assessed at the neck and appendages and was normal.  Reflexes -  The patient's reflexes were 1+ in all extremities and he had no pathological reflexes.  Sensory - Light touch, temperature/pinprick were assessed and were decreased on the LUE.    Coordination - The patient had significant LUE ataxia.  Tremor was absent.  Gait and Station - not tested.   ASSESSMENT/PLAN Mr. Thomas Burnett is a 82 y.o. male with history of HTN presenting with L sided weakness and hypertensive emergency. CT showed R thalamic hemorrhage.   ICH:  right small thalamic hemorrhage secondary to hypertensive emergency  Resultant  LUE weakness and incoordination  CT - R thalamic hemorrhage, 2.4cc  Repeat CT head - no significant change  Carotid Doppler - unremarkable  2D Echo - EF 55-60%. No cardiac source of emboli identified.  LDL - 146  HgbA1c - 5.8  SCDs for VTE prophylaxis Diet regular Room service appropriate? Yes; Fluid consistency: Thin  aspirin 81 mg daily prior to admission. Had been in [place] for afib but stopped due to allergy. Now on  no antithrombotics due to [place]. May consider eliquis in the future due to afib once ICH resolves.  Ongoing aggressive stroke risk factor management  Patient does not desire surgical procedures  Therapy recommendations:  CIR   Disposition:  pending   Hypertensive Emergency  SBP 220s on arrival in setting of neurologic emergency  Lasix at home  off cleviprex now  Resume lasix and put on hydralazine as well as lisinopril for BP control  SBP goal < 160  Long-term BP goal normotensive  Chronic afib  Has tried Xarelto in the past, stopped due to rash and hives  Currently in afib on tele  No anticoagulation now due to [place]  May consider eliquis in the future once ICH resolves  Hyperlipidemia  Home meds: no statin  LDL 146, goal < 70  Put on pravastatin 55m   Continue statin on discharge.  Other Stroke Risk Factors  Advanced age  ETOH use, advised to drink no more than 2 drink(s) a  day  Obesity, Body mass index is 30.07 kg/m., recommend weight loss, diet and exercise as appropriate   Other Active Problems  Stage 2 prostate cancer  RBBB   Baseline LLE weakness and walk with walker  Hospital day # 2  JRosalin Hawking MD PhD Stroke Neurology 07/30/2016 6:18 PM     To contact Stroke Continuity provider, please refer to [email or web address] After hours, contact General Neurology

## 2016-07-31 LAB — HEMOGLOBIN A1C
HEMOGLOBIN A1C: 5.7 % — AB (ref 4.8–5.6)
MEAN PLASMA GLUCOSE: 117 mg/dL

## 2016-07-31 LAB — CBC
HEMATOCRIT: 45.4 % (ref 39.0–52.0)
HEMOGLOBIN: 15.3 g/dL (ref 13.0–17.0)
MCH: 31.7 pg (ref 26.0–34.0)
MCHC: 33.7 g/dL (ref 30.0–36.0)
MCV: 94 fL (ref 78.0–100.0)
Platelets: 179 10*3/uL (ref 150–400)
RBC: 4.83 MIL/uL (ref 4.22–5.81)
RDW: 14.4 % (ref 11.5–15.5)
WBC: 8.4 10*3/uL (ref 4.0–10.5)

## 2016-07-31 LAB — BASIC METABOLIC PANEL
Anion gap: 8 (ref 5–15)
BUN: 23 mg/dL — AB (ref 6–20)
CHLORIDE: 106 mmol/L (ref 101–111)
CO2: 27 mmol/L (ref 22–32)
Calcium: 9.6 mg/dL (ref 8.9–10.3)
Creatinine, Ser: 1.21 mg/dL (ref 0.61–1.24)
GFR calc non Af Amer: 49 mL/min — ABNORMAL LOW (ref >60)
GFR, EST AFRICAN AMERICAN: 57 mL/min — AB (ref >60)
Glucose, Bld: 115 mg/dL — ABNORMAL HIGH (ref 65–99)
POTASSIUM: 3.8 mmol/L (ref 3.5–5.1)
SODIUM: 141 mmol/L (ref 135–145)

## 2016-07-31 MED ORDER — AMLODIPINE BESYLATE 10 MG PO TABS
10.0000 mg | ORAL_TABLET | Freq: Every day | ORAL | Status: DC
  Administered 2016-07-31 – 2016-08-01 (×2): 10 mg via ORAL
  Filled 2016-07-31 (×2): qty 1

## 2016-07-31 MED ORDER — ACETAMINOPHEN 500 MG PO TABS
500.0000 mg | ORAL_TABLET | Freq: Once | ORAL | Status: AC
  Administered 2016-07-31: 500 mg via ORAL
  Filled 2016-07-31: qty 1

## 2016-07-31 NOTE — Progress Notes (Signed)
Physical Therapy Treatment Patient Details Name: Thomas Burnett MRN: [id number] DOB: 02/24/1920 Today's Date: 07/31/2016    History of Present Illness 82 y.o.malewith a history of hypertension presenting with left sided weakness. Upon EMS evaluation, pt with blood pressure in 220s. CT on 10/25 positive for acute right thalamic hemorrhage.    PT Comments    Patient is progressing well towards physical therapy goals. He is very motivated to improve his function. Able to weight bear and stand x4 minutes with +2 max assist, focusing on LLE control and upright posture. Pt able to void in standing with assist for stability. Tolerated exercises well. Patient will continue to benefit from skilled physical therapy services to further improve independence with functional mobility.    Follow Up Recommendations  CIR     Equipment Recommendations       Recommendations for Other Services Rehab consult     Precautions / Restrictions Precautions Precautions: Fall Precaution Comments: Skin tear on L elbow from fall Restrictions Weight Bearing Restrictions: No    Mobility  Bed Mobility               General bed mobility comments: OOB in recliner  Transfers Overall transfer level: Needs assistance Equipment used: Rolling walker (2 wheeled) Transfers: Sit to/from Stand Sit to Stand: Max assist;+2 physical assistance         General transfer comment: Practiced sit<>stand with RLE block and guidance for LUE with Cues for technique and +2 Max assist to rise. Able to reach for RW with LUE however ataxic requiring assit to grab once upright. Tactile cues to facilitate upright stance.  Ambulation/Gait                 Stairs            Wheelchair Mobility    Modified Rankin (Stroke Patients Only)       Balance Overall balance assessment: Needs assistance         Standing balance support: Bilateral upper extremity supported Standing balance-Leahy Scale:  Zero Standing balance comment: BIL UE support with max assist +2 for balance. Patient able to tolerate upright stance for approx 4 minute while assisting to urinate in urinal. Frequent tactile cues for upright posture and to find midline. Good hand grib strength BIL with intermittent buckling of Lt knee and inability to maintain sustained extension while upright.                    Cognition Arousal/Alertness: Awake/alert Behavior During Therapy: WFL for tasks assessed/performed Overall Cognitive Status: Within Functional Limits for tasks assessed                      Exercises General Exercises - Lower Extremity Ankle Circles/Pumps: AROM;Both;20 reps;Seated Long Arc Quad: Strengthening;Both;10 reps;Seated Hip Flexion/Marching: Strengthening;Both;10 reps;Seated    General Comments General comments (skin integrity, edema, etc.): Urinated, RN notified.      Pertinent Vitals/Pain Pain Assessment: Faces Faces Pain Scale: Hurts even more Pain Location: LLE Pain Descriptors / Indicators: Sore Pain Intervention(s): Monitored during session;Repositioned    Home Living                      Prior Function            PT Goals (current goals can now be found in the care plan section) Acute Rehab PT Goals Patient Stated Goal: get back to being independent PT Goal Formulation: With patient/family Time For Goal  Achievement: 08/13/16 Potential to Achieve Goals: Fair Progress towards PT goals: Progressing toward goals    Frequency    Min 4X/week      PT Plan Current plan remains appropriate    Co-evaluation             End of Session Equipment Utilized During Treatment: Gait belt Activity Tolerance: Patient tolerated treatment well Patient left: in chair;with call bell/phone within reach;with chair alarm set;with SCD's reapplied     Time: 1532-1600 PT Time Calculation (min) (ACUTE ONLY): 28 min  Charges:  $Therapeutic Exercise: 8-22  mins $Neuromuscular Re-education: 8-22 mins                    G Codes:      Ellouise Newer 2016/08/27, 4:25 PM  Elayne Snare, PT

## 2016-07-31 NOTE — Progress Notes (Signed)
STROKE TEAM PROGRESS NOTE   HISTORY OF PRESENT ILLNESS (per record) Thomas Burnett is a 82 y.o. male with a history of hypertension who is only currently taking Lasix. He was in his normal state of health until 19:15 07/28/2016 (LKW) at which time he developed left-sided weakness. EMS responded and found him to have left-sided weakness and therefore called a code stroke and brought him into the emergency department. On their initial evaluation, he had blood pressures in the 220s. In CT, he was observed to have a small thalamic hemorrhage. He was given labetalol, and initially seemed to have a good response but subsequently his blood pressure went back up. ICH Score: 1. He was admitted to the neuro ICU for further evaluation and treatment.   SUBJECTIVE (INTERVAL HISTORY) Complains of left side weakness.  Working with PT.  No headache.     I reviewed CT brain and right BG hemorrhage is stable.  No IVH or HC.  No midline shift.    BP is still not well controlled on Lisinopril and Hydralazine.     OBJECTIVE Temp:  [98.1 F (36.7 C)-98.4 F (36.9 C)] 98.4 F (36.9 C) (10/28 0917) Pulse Rate:  [83-97] 94 (10/28 0917) Cardiac Rhythm: Bundle branch block;Heart block (10/28 0700) Resp:  [18-20] 18 (10/28 0917) BP: (128-154)/(59-135) 150/104 (10/28 0917) SpO2:  [91 %-98 %] 97 % (10/28 0917)  CBC:  Recent Labs Lab 07/28/16 2010  07/30/16 0506 07/31/16 0323  WBC 7.6  < > 9.0 8.4  NEUTROABS 4.3  --   --   --   HGB 15.8  < > 16.3 15.3  HCT 46.3  < > 48.3 45.4  MCV 93.2  < > 93.1 94.0  PLT 162  < > 180 179  < > = values in this interval not displayed.  Basic Metabolic Panel:   Recent Labs Lab 07/30/16 0001 07/30/16 0506 07/31/16 0323  NA 139 140 141  K 3.6 3.8 3.8  CL 108 105 106  CO2 25 27 27   GLUCOSE 149* 135* 115*  BUN 20 20 23*  CREATININE 1.02 1.07 1.21  CALCIUM 9.4 9.8 9.6  MG 1.8  --   --     Lipid Panel:     Component Value Date/Time   CHOL 220 (H) 07/29/2016  1012   TRIG 85 07/30/2016 0507   HDL 61 07/29/2016 1012   CHOLHDL 3.6 07/29/2016 1012   VLDL 13 07/29/2016 1012   LDLCALC 146 (H) 07/29/2016 1012   HgbA1c:  Lab Results  Component Value Date   HGBA1C 5.7 (H) 07/30/2016   Urine Drug Screen:     Component Value Date/Time   LABOPIA NONE DETECTED 07/28/2016 0546   COCAINSCRNUR NONE DETECTED 07/28/2016 0546   LABBENZ NONE DETECTED 07/28/2016 0546   AMPHETMU NONE DETECTED 07/28/2016 0546   THCU NONE DETECTED 07/28/2016 0546   LABBARB NONE DETECTED 07/28/2016 0546      IMAGING I have personally reviewed the radiological images below and agree with the radiology interpretations.  Ct Cervical Spine Wo Contrast 07/28/2016 No acute/traumatic cervical spine pathology. Multilevel degenerative changes.   Dg Chest Portable 1 View 07/28/2016 No acute abnormality noted.   Ct Head Code Stroke Wo Contrast 07/28/2016 1. 13 x 15 x 25 mm acute right thalamic parenchymal hemorrhage, estimated volume 2.4 cc. Mild localized edema without significant mass effect. No intraventricular extension. Hypertensive etiology is suspected. 2. Generalized age-related cerebral atrophy with chronic microvascular ischemic disease.   CUS  07/29/2016 Summary: Findings  are consistent with a 1-39 percent stenosis involving the right internal carotid artery and the left internal carotid artery. The vertebral arteries demonstrate antegrade flow.  TTE  07/29/2016 Study Conclusions - Left ventricle: The cavity size was normal. Wall thickness was   increased in a pattern of moderate LVH. Systolic function was   normal. The estimated ejection fraction was in the range of 55%   to 60%. Regional wall motion abnormalities cannot be excluded.   Doppler parameters are consistent with abnormal left ventricular   relaxation (grade 1 diastolic dysfunction). Doppler parameters   are consistent with high ventricular filling pressure. - Aortic valve: Valve mobility was  restricted. There was moderate   stenosis. Valve area (VTI): 1.49 cm^2. Valve area (Vmax): 1.27   cm^2. Valve area (Vmean): 1.29 cm^2. - Mitral valve: Calcified annulus. There was mild regurgitation. - Left atrium: The atrium was mildly dilated. Impressions: - Technically difficult; normal LV systolic function; grade 1   diastolic dysfunction; elevated LV filling pressure; calcified   aortic valve with moderate AS (mean gradient 24 mmHg); mild MR;   mild LAE.  Repeat CT  07/29/2016 Unchanged right internal capsule and thalamic hematoma. No significant mass effect. No new abnormality.   PHYSICAL EXAM  Temp:  [98.1 F (36.7 C)-98.4 F (36.9 C)] 98.4 F (36.9 C) (10/28 0917) Pulse Rate:  [83-97] 94 (10/28 0917) Resp:  [18-20] 18 (10/28 0917) BP: (128-154)/(59-135) 150/104 (10/28 0917) SpO2:  [91 %-98 %] 97 % (10/28 0917)  General - Well nourished, well developed, in no apparent distress.  Ophthalmologic - Fundi not visualized due to noncooperation.  Cardiovascular - irregularly irregular heart rate and rhythm.  Mental Status -  Level of arousal and orientation to time, place, and person were intact. Language including expression, naming, repetition, comprehension was assessed and found intact, mild dysarthria. Fund of Knowledge was assessed and was intact.  Cranial Nerves II - XII - II - Visual field intact OU. III, IV, VI - Extraocular movements intact. V - Facial sensation intact bilaterally. VII - mild left facial droop. VIII - Hearing & vestibular intact bilaterally. X - Palate elevates symmetrically, mild dysarthria. XI - Chin turning & shoulder shrug intact bilaterally. XII - Tongue protrusion intact.  Motor Strength - The patient's strength was normal in RUE and RLE, but 3/5 LUE and 4/5 LLE .  Bulk was normal and fasciculations were absent.   Motor Tone - Muscle tone was assessed at the neck and appendages and was normal.  Reflexes - The patient's reflexes were  1+ in all extremities and he had no pathological reflexes.  Sensory - Light touch, temperature/pinprick were assessed and were decreased on the LUE.    Coordination - The patient had significant LUE ataxia.  Tremor was absent.  Gait and Station - not tested.   ASSESSMENT/PLAN Thomas Burnett is a 82 y.o. male with history of HTN presenting with L sided weakness and hypertensive emergency. CT showed R thalamic hemorrhage.   ICH:  right small thalamic hemorrhage secondary to hypertensive emergency  Resultant  LUE weakness and incoordination  CT - R thalamic hemorrhage, 2.4cc  Repeat CT head - no significant change  Carotid Doppler - unremarkable  2D Echo - EF 55-60%. No cardiac source of emboli identified.  LDL - 146  HgbA1c - 5.8  SCDs for VTE prophylaxis Diet regular Room service appropriate? Yes; Fluid consistency: Thin  aspirin 81 mg daily prior to admission. Had been in [place] for afib but stopped  due to allergy. Now on no antithrombotics due to [place]. May consider eliquis in the future due to afib once ICH resolves.  Ongoing aggressive stroke risk factor management  Patient does not desire surgical procedures  Therapy recommendations:  CIR   Disposition:  pending   Hypertensive Emergency  SBP 220s on arrival in setting of neurologic emergency  Lasix at home  off cleviprex now  Resume lasix and put on hydralazine as well as lisinopril for BP control  SBP goal < 160  Long-term BP goal normotensive  Chronic afib  Has tried Xarelto in the past, stopped due to rash and hives  Currently in afib on tele  No anticoagulation now due to [place]  May consider eliquis in the future once ICH resolves  Hyperlipidemia  Home meds: no statin  LDL 146, goal < 70  Put on pravastatin 40mg    Continue statin on discharge.  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  Bleed is not expanding and stable.    BP is still not optimally controlled.  Hydralazine may cause increased intracranial edema.  I will discontinue that and replace it with Amlodpine 10 mg qd.  If BP does not improve, I can increase his Lisinopril to 40 mg qd.    He is going to be transferring to inpatient rehab soon per PT.    Rogue Jury, MD    To contact Stroke Continuity provider, please refer to [email or web address] After hours, contact General Neurology

## 2016-08-01 LAB — BASIC METABOLIC PANEL
Anion gap: 7 (ref 5–15)
BUN: 25 mg/dL — AB (ref 6–20)
CALCIUM: 9.6 mg/dL (ref 8.9–10.3)
CHLORIDE: 108 mmol/L (ref 101–111)
CO2: 25 mmol/L (ref 22–32)
CREATININE: 1.12 mg/dL (ref 0.61–1.24)
GFR calc non Af Amer: 54 mL/min — ABNORMAL LOW (ref >60)
Glucose, Bld: 125 mg/dL — ABNORMAL HIGH (ref 65–99)
Potassium: 4 mmol/L (ref 3.5–5.1)
Sodium: 140 mmol/L (ref 135–145)

## 2016-08-01 LAB — CBC
HEMATOCRIT: 44.2 % (ref 39.0–52.0)
Hemoglobin: 14.9 g/dL (ref 13.0–17.0)
MCH: 31.6 pg (ref 26.0–34.0)
MCHC: 33.7 g/dL (ref 30.0–36.0)
MCV: 93.8 fL (ref 78.0–100.0)
Platelets: 178 10*3/uL (ref 150–400)
RBC: 4.71 MIL/uL (ref 4.22–5.81)
RDW: 14.5 % (ref 11.5–15.5)
WBC: 8.5 10*3/uL (ref 4.0–10.5)

## 2016-08-01 MED ORDER — DIPHENHYDRAMINE HCL 50 MG/ML IJ SOLN: 12.5000 mg | Freq: Every evening | INTRAMUSCULAR | Status: DC | PRN

## 2016-08-01 MED ORDER — AMLODIPINE BESYLATE 5 MG PO TABS
5.0000 mg | ORAL_TABLET | Freq: Every day | ORAL | Status: DC
  Administered 2016-08-02: 5 mg via ORAL
  Filled 2016-08-01: qty 1

## 2016-08-01 NOTE — Plan of Care (Signed)
Problem: Activity: Goal: Risk for activity intolerance will decrease Outcome: Not Progressing Chair bound at present, max 2-3 assist chair to [facility].

## 2016-08-01 NOTE — Progress Notes (Signed)
STROKE TEAM PROGRESS NOTE   HISTORY OF PRESENT ILLNESS (per record) Thomas Burnett is a 82 y.o. male with a history of hypertension who is only currently taking Lasix. He was in his normal state of health until 19:15 07/28/2016 (LKW) at which time he developed left-sided weakness. EMS responded and found him to have left-sided weakness and therefore called a code stroke and brought him into the emergency department. On their initial evaluation, he had blood pressures in the 220s. In CT, he was observed to have a small thalamic hemorrhage. He was given labetalol, and initially seemed to have a good response but subsequently his blood pressure went back up. ICH Score: 1. He was admitted to the neuro ICU for further evaluation and treatment.   SUBJECTIVE (INTERVAL HISTORY) He complained of itching all night.  I had changed his Hydralazine to Norvasc.  His BP is much better controlled.    No significant change to his left side.  Awaiting inpatient rehab transfer tomorrow.      OBJECTIVE Temp:  [97.3 F (36.3 C)-98.6 F (37 C)] 97.3 F (36.3 C) (10/29 0944) Pulse Rate:  [50-101] 101 (10/29 0944) Cardiac Rhythm: Atrial fibrillation (10/29 0700) Resp:  [18-20] 20 (10/29 0944) BP: (106-172)/(46-89) 118/87 (10/29 0944) SpO2:  [93 %-97 %] 96 % (10/29 0944)  CBC:  Recent Labs Lab 07/28/16 2010  07/31/16 0323 08/01/16 0306  WBC 7.6  < > 8.4 8.5  NEUTROABS 4.3  --   --   --   HGB 15.8  < > 15.3 14.9  HCT 46.3  < > 45.4 44.2  MCV 93.2  < > 94.0 93.8  PLT 162  < > 179 178  < > = values in this interval not displayed.  Basic Metabolic Panel:   Recent Labs Lab 07/30/16 0001  07/31/16 0323 08/01/16 0306  NA 139  < > 141 140  K 3.6  < > 3.8 4.0  CL 108  < > 106 108  CO2 25  < > 27 25  GLUCOSE 149*  < > 115* 125*  BUN 20  < > 23* 25*  CREATININE 1.02  < > 1.21 1.12  CALCIUM 9.4  < > 9.6 9.6  MG 1.8  --   --   --   < > = values in this interval not displayed.  Lipid Panel:    Component Value Date/Time   CHOL 220 (H) 07/29/2016 1012   TRIG 85 07/30/2016 0507   HDL 61 07/29/2016 1012   CHOLHDL 3.6 07/29/2016 1012   VLDL 13 07/29/2016 1012   LDLCALC 146 (H) 07/29/2016 1012   HgbA1c:  Lab Results  Component Value Date   HGBA1C 5.7 (H) 07/30/2016   Urine Drug Screen:     Component Value Date/Time   LABOPIA NONE DETECTED 07/28/2016 0546   COCAINSCRNUR NONE DETECTED 07/28/2016 0546   LABBENZ NONE DETECTED 07/28/2016 0546   AMPHETMU NONE DETECTED 07/28/2016 0546   THCU NONE DETECTED 07/28/2016 0546   LABBARB NONE DETECTED 07/28/2016 0546      IMAGING I have personally reviewed the radiological images below and agree with the radiology interpretations.  Ct Cervical Spine Wo Contrast 07/28/2016 No acute/traumatic cervical spine pathology. Multilevel degenerative changes.   Dg Chest Portable 1 View 07/28/2016 No acute abnormality noted.   Ct Head Code Stroke Wo Contrast 07/28/2016 1. 13 x 15 x 25 mm acute right thalamic parenchymal hemorrhage, estimated volume 2.4 cc. Mild localized edema without significant mass effect. No intraventricular  extension. Hypertensive etiology is suspected. 2. Generalized age-related cerebral atrophy with chronic microvascular ischemic disease.   Repeat CT  07/29/2016 Unchanged right internal capsule and thalamic hematoma. No significant mass effect. No new abnormality.   CUS  07/29/2016 Summary: Findings are consistent with a 1-39 percent stenosis involving the right internal carotid artery and the left internal carotid artery. The vertebral arteries demonstrate antegrade flow.  TTE  07/29/2016 Study Conclusions - Left ventricle: The cavity size was normal. Wall thickness was   increased in a pattern of moderate LVH. Systolic function was   normal. The estimated ejection fraction was in the range of 55%   to 60%. Regional wall motion abnormalities cannot be excluded.   Doppler parameters are consistent with  abnormal left ventricular   relaxation (grade 1 diastolic dysfunction). Doppler parameters   are consistent with high ventricular filling pressure. - Aortic valve: Valve mobility was restricted. There was moderate   stenosis. Valve area (VTI): 1.49 cm^2. Valve area (Vmax): 1.27   cm^2. Valve area (Vmean): 1.29 cm^2. - Mitral valve: Calcified annulus. There was mild regurgitation. - Left atrium: The atrium was mildly dilated. Impressions: - Technically difficult; normal LV systolic function; grade 1   diastolic dysfunction; elevated LV filling pressure; calcified   aortic valve with moderate AS (mean gradient 24 mmHg); mild MR;   mild LAE.    PHYSICAL EXAM  Temp:  [97.3 F (36.3 C)-98.6 F (37 C)] 97.3 F (36.3 C) (10/29 0944) Pulse Rate:  [50-101] 101 (10/29 0944) Resp:  [18-20] 20 (10/29 0944) BP: (106-172)/(46-89) 118/87 (10/29 0944) SpO2:  [93 %-97 %] 96 % (10/29 0944)  General - Well nourished, well developed, in no apparent distress.  Ophthalmologic - Fundi not visualized due to noncooperation.  Cardiovascular - irregularly irregular heart rate and rhythm.  Mental Status -  Level of arousal and orientation to time, place, and person were intact. Language including expression, naming, repetition, comprehension was assessed and found intact, mild dysarthria. Fund of Knowledge was assessed and was intact.  Cranial Nerves II - XII - II - Visual field intact OU. III, IV, VI - Extraocular movements intact. V - Facial sensation intact bilaterally. VII - mild left facial droop. VIII - Hearing & vestibular intact bilaterally. X - Palate elevates symmetrically, mild dysarthria. XI - Chin turning & shoulder shrug intact bilaterally. XII - Tongue protrusion intact.  Motor Strength - The patient's strength was normal in RUE and RLE, but 3/5 LUE and 4/5 LLE .  Bulk was normal and fasciculations were absent.   Motor Tone - Muscle tone was assessed at the neck and appendages and  was normal.  Reflexes - The patient's reflexes were 1+ in all extremities and he had no pathological reflexes.  Sensory - Light touch, temperature/pinprick were assessed and were decreased on the LUE.    Coordination - The patient had significant LUE ataxia.  Tremor was absent.  Gait and Station - not tested.   ASSESSMENT/PLAN Mr. Thomas Burnett is a 82 y.o. male with history of HTN presenting with L sided weakness and hypertensive emergency. CT showed R thalamic hemorrhage.   ICH:  right small thalamic hemorrhage secondary to hypertensive emergency  Resultant  LUE weakness and incoordination  CT - R thalamic hemorrhage, 2.4cc  Repeat CT head - no significant change  Carotid Doppler - unremarkable  2D Echo - EF 55-60%. No cardiac source of emboli identified.  LDL - 146  HgbA1c - 5.8  SCDs for VTE prophylaxis Diet  regular Room service appropriate? Yes; Fluid consistency: Thin  aspirin 81 mg daily prior to admission. Had been in [place] for afib but stopped due to allergy. Now on no antithrombotics due to [place]. May consider eliquis in the future due to afib once ICH resolves.  Ongoing aggressive stroke risk factor management  Patient does not desire surgical procedures  Therapy recommendations:  CIR   Disposition:  pending   Hypertensive Emergency  SBP 220s on arrival in setting of neurologic emergency  Lasix at home  off cleviprex now  Resume lasix and put on hydralazine as well as lisinopril for BP control  SBP goal < 160  Long-term BP goal normotensive  Chronic afib  Has tried Xarelto in the past, stopped due to rash and hives  Currently in afib on tele  No anticoagulation now due to [place]  May consider eliquis in the future once ICH resolves  Hyperlipidemia  Home meds: no statin  LDL 146, goal < 70  Put on pravastatin 40mg    Continue statin on discharge.  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   BP is in excellent control, but there is a possibility he has an allergy to Norvasc . I will lower the dose and give him Benadryl as needed to see if that helps.  If still a problem, then will simply increase his Lisinopril.    He is going to be transferring to inpatient rehab soon per PT.    Rogue Jury, MD    To contact Stroke Continuity provider, please refer to [email or web address] After hours, contact General Neurology

## 2016-08-02 ENCOUNTER — Inpatient Hospital Stay (HOSPITAL_COMMUNITY)
Admission: RE | Admit: 2016-08-02 | Discharge: 2016-08-18 | DRG: 092 | Disposition: A | Discharge status: Skilled Nursing Facility | Payer: Medicare Other | Source: Intra-hospital | Attending: Physical Medicine & Rehabilitation | Admitting: Physical Medicine & Rehabilitation

## 2016-08-02 DIAGNOSIS — L89150 Pressure ulcer of sacral region, unstageable: Secondary | ICD-10-CM | POA: Diagnosis present

## 2016-08-02 DIAGNOSIS — E782 Mixed hyperlipidemia: Secondary | ICD-10-CM | POA: Diagnosis present

## 2016-08-02 DIAGNOSIS — R269 Unspecified abnormalities of gait and mobility: Secondary | ICD-10-CM

## 2016-08-02 DIAGNOSIS — G8194 Hemiplegia, unspecified affecting left nondominant side: Secondary | ICD-10-CM | POA: Diagnosis not present

## 2016-08-02 DIAGNOSIS — I482 Chronic atrial fibrillation, unspecified: Secondary | ICD-10-CM | POA: Diagnosis present

## 2016-08-02 DIAGNOSIS — I10 Essential (primary) hypertension: Secondary | ICD-10-CM | POA: Diagnosis present

## 2016-08-02 DIAGNOSIS — K210 Gastro-esophageal reflux disease with esophagitis: Secondary | ICD-10-CM | POA: Diagnosis not present

## 2016-08-02 DIAGNOSIS — R58 Hemorrhage, not elsewhere classified: Secondary | ICD-10-CM | POA: Diagnosis not present

## 2016-08-02 DIAGNOSIS — K5903 Drug induced constipation: Secondary | ICD-10-CM | POA: Diagnosis not present

## 2016-08-02 DIAGNOSIS — K5900 Constipation, unspecified: Secondary | ICD-10-CM | POA: Diagnosis not present

## 2016-08-02 DIAGNOSIS — R52 Pain, unspecified: Secondary | ICD-10-CM

## 2016-08-02 DIAGNOSIS — G4700 Insomnia, unspecified: Secondary | ICD-10-CM | POA: Diagnosis not present

## 2016-08-02 DIAGNOSIS — I700 Atherosclerosis of aorta: Secondary | ICD-10-CM | POA: Diagnosis present

## 2016-08-02 DIAGNOSIS — I351 Nonrheumatic aortic (valve) insufficiency: Secondary | ICD-10-CM | POA: Diagnosis present

## 2016-08-02 DIAGNOSIS — Z79899 Other long term (current) drug therapy: Secondary | ICD-10-CM

## 2016-08-02 DIAGNOSIS — I509 Heart failure, unspecified: Secondary | ICD-10-CM | POA: Diagnosis present

## 2016-08-02 DIAGNOSIS — E669 Obesity, unspecified: Secondary | ICD-10-CM | POA: Diagnosis present

## 2016-08-02 DIAGNOSIS — I6789 Other cerebrovascular disease: Secondary | ICD-10-CM | POA: Diagnosis not present

## 2016-08-02 DIAGNOSIS — I69398 Other sequelae of cerebral infarction: Secondary | ICD-10-CM

## 2016-08-02 DIAGNOSIS — I619 Nontraumatic intracerebral hemorrhage, unspecified: Secondary | ICD-10-CM | POA: Diagnosis not present

## 2016-08-02 DIAGNOSIS — Z8546 Personal history of malignant neoplasm of prostate: Secondary | ICD-10-CM | POA: Diagnosis not present

## 2016-08-02 DIAGNOSIS — R278 Other lack of coordination: Secondary | ICD-10-CM | POA: Diagnosis not present

## 2016-08-02 DIAGNOSIS — I4891 Unspecified atrial fibrillation: Secondary | ICD-10-CM | POA: Diagnosis not present

## 2016-08-02 DIAGNOSIS — E785 Hyperlipidemia, unspecified: Secondary | ICD-10-CM | POA: Diagnosis present

## 2016-08-02 DIAGNOSIS — M6281 Muscle weakness (generalized): Secondary | ICD-10-CM | POA: Diagnosis not present

## 2016-08-02 DIAGNOSIS — I161 Hypertensive emergency: Secondary | ICD-10-CM | POA: Diagnosis present

## 2016-08-02 DIAGNOSIS — N390 Urinary tract infection, site not specified: Secondary | ICD-10-CM | POA: Diagnosis not present

## 2016-08-02 DIAGNOSIS — I69354 Hemiplegia and hemiparesis following cerebral infarction affecting left non-dominant side: Secondary | ICD-10-CM | POA: Diagnosis not present

## 2016-08-02 DIAGNOSIS — R262 Difficulty in walking, not elsewhere classified: Secondary | ICD-10-CM | POA: Diagnosis not present

## 2016-08-02 DIAGNOSIS — C61 Malignant neoplasm of prostate: Secondary | ICD-10-CM | POA: Diagnosis present

## 2016-08-02 DIAGNOSIS — F4322 Adjustment disorder with anxiety: Secondary | ICD-10-CM | POA: Diagnosis present

## 2016-08-02 DIAGNOSIS — M1612 Unilateral primary osteoarthritis, left hip: Secondary | ICD-10-CM | POA: Diagnosis present

## 2016-08-02 DIAGNOSIS — R7989 Other specified abnormal findings of blood chemistry: Secondary | ICD-10-CM

## 2016-08-02 DIAGNOSIS — F5101 Primary insomnia: Secondary | ICD-10-CM

## 2016-08-02 DIAGNOSIS — Z888 Allergy status to other drugs, medicaments and biological substances status: Secondary | ICD-10-CM

## 2016-08-02 DIAGNOSIS — J3089 Other allergic rhinitis: Secondary | ICD-10-CM | POA: Diagnosis not present

## 2016-08-02 DIAGNOSIS — I69322 Dysarthria following cerebral infarction: Secondary | ICD-10-CM

## 2016-08-02 DIAGNOSIS — R2689 Other abnormalities of gait and mobility: Secondary | ICD-10-CM | POA: Diagnosis present

## 2016-08-02 DIAGNOSIS — M25552 Pain in left hip: Secondary | ICD-10-CM

## 2016-08-02 DIAGNOSIS — T148XXA Other injury of unspecified body region, initial encounter: Secondary | ICD-10-CM | POA: Insufficient documentation

## 2016-08-02 DIAGNOSIS — I4510 Unspecified right bundle-branch block: Secondary | ICD-10-CM | POA: Diagnosis present

## 2016-08-02 DIAGNOSIS — M160 Bilateral primary osteoarthritis of hip: Secondary | ICD-10-CM | POA: Diagnosis not present

## 2016-08-02 DIAGNOSIS — F5104 Psychophysiologic insomnia: Secondary | ICD-10-CM | POA: Diagnosis present

## 2016-08-02 DIAGNOSIS — Z7982 Long term (current) use of aspirin: Secondary | ICD-10-CM

## 2016-08-02 DIAGNOSIS — T402X5A Adverse effect of other opioids, initial encounter: Secondary | ICD-10-CM | POA: Diagnosis not present

## 2016-08-02 DIAGNOSIS — I69193 Ataxia following nontraumatic intracerebral hemorrhage: Secondary | ICD-10-CM | POA: Diagnosis not present

## 2016-08-02 DIAGNOSIS — Z87891 Personal history of nicotine dependence: Secondary | ICD-10-CM | POA: Diagnosis not present

## 2016-08-02 DIAGNOSIS — M150 Primary generalized (osteo)arthritis: Secondary | ICD-10-CM | POA: Diagnosis not present

## 2016-08-02 DIAGNOSIS — I350 Nonrheumatic aortic (valve) stenosis: Secondary | ICD-10-CM | POA: Diagnosis present

## 2016-08-02 DIAGNOSIS — I69154 Hemiplegia and hemiparesis following nontraumatic intracerebral hemorrhage affecting left non-dominant side: Secondary | ICD-10-CM | POA: Diagnosis not present

## 2016-08-02 LAB — CREATININE, SERUM
Creatinine, Ser: 1.33 mg/dL — ABNORMAL HIGH (ref 0.61–1.24)
GFR, EST AFRICAN AMERICAN: 51 mL/min — AB (ref >60)
GFR, EST NON AFRICAN AMERICAN: 44 mL/min — AB (ref >60)

## 2016-08-02 MED ORDER — PROCHLORPERAZINE EDISYLATE 5 MG/ML IJ SOLN: 5.0000 mg | Freq: Four times a day (QID) | INTRAMUSCULAR | Status: DC | PRN

## 2016-08-02 MED ORDER — PROCHLORPERAZINE 25 MG RE SUPP: 12.5000 mg | Freq: Four times a day (QID) | RECTAL | Status: DC | PRN

## 2016-08-02 MED ORDER — TRAZODONE HCL 50 MG PO TABS
25.0000 mg | ORAL_TABLET | Freq: Every evening | ORAL | Status: DC | PRN
  Administered 2016-08-03 – 2016-08-04 (×3): 50 mg via ORAL
  Administered 2016-08-05: 25 mg via ORAL
  Administered 2016-08-06 – 2016-08-17 (×11): 50 mg via ORAL
  Filled 2016-08-02 (×15): qty 1

## 2016-08-02 MED ORDER — PROCHLORPERAZINE MALEATE 5 MG PO TABS: 5.0000 mg | ORAL_TABLET | Freq: Four times a day (QID) | ORAL | Status: DC | PRN

## 2016-08-02 MED ORDER — DIPHENHYDRAMINE HCL 12.5 MG/5ML PO ELIX: 12.5000 mg | ORAL_SOLUTION | Freq: Four times a day (QID) | ORAL | Status: DC | PRN

## 2016-08-02 MED ORDER — GUAIFENESIN-DM 100-10 MG/5ML PO SYRP: 5.0000 mL | ORAL_SOLUTION | Freq: Four times a day (QID) | ORAL | Status: DC | PRN

## 2016-08-02 MED ORDER — VITAMIN D 1000 UNITS PO TABS
1000.0000 [IU] | ORAL_TABLET | Freq: Every day | ORAL | Status: DC
  Administered 2016-08-03 – 2016-08-18 (×16): 1000 [IU] via ORAL
  Filled 2016-08-02 (×17): qty 1

## 2016-08-02 MED ORDER — DIPHENHYDRAMINE HCL 50 MG/ML IJ SOLN: 12.5000 mg | Freq: Every evening | INTRAMUSCULAR | Status: DC | PRN

## 2016-08-02 MED ORDER — ENOXAPARIN SODIUM 40 MG/0.4ML ~~LOC~~ SOLN
40.0000 mg | Freq: Every day | SUBCUTANEOUS | Status: DC
  Administered 2016-08-03 – 2016-08-18 (×16): 40 mg via SUBCUTANEOUS
  Filled 2016-08-02 (×16): qty 0.4

## 2016-08-02 MED ORDER — SENNOSIDES-DOCUSATE SODIUM 8.6-50 MG PO TABS
1.0000 | ORAL_TABLET | Freq: Two times a day (BID) | ORAL | Status: DC
  Administered 2016-08-02 – 2016-08-04 (×4): 1 via ORAL
  Filled 2016-08-02 (×4): qty 1

## 2016-08-02 MED ORDER — AMLODIPINE BESYLATE 5 MG PO TABS
5.0000 mg | ORAL_TABLET | Freq: Every day | ORAL | Status: DC
  Administered 2016-08-03 – 2016-08-17 (×15): 5 mg via ORAL
  Filled 2016-08-02 (×16): qty 1

## 2016-08-02 MED ORDER — PANTOPRAZOLE SODIUM 40 MG PO TBEC
40.0000 mg | DELAYED_RELEASE_TABLET | Freq: Every day | ORAL | Status: DC
  Administered 2016-08-03 – 2016-08-18 (×16): 40 mg via ORAL
  Filled 2016-08-02 (×16): qty 1

## 2016-08-02 MED ORDER — FUROSEMIDE 20 MG PO TABS
20.0000 mg | ORAL_TABLET | Freq: Every day | ORAL | Status: DC
  Administered 2016-08-03 – 2016-08-18 (×16): 20 mg via ORAL
  Filled 2016-08-02 (×16): qty 1

## 2016-08-02 MED ORDER — PRAVASTATIN SODIUM 40 MG PO TABS
40.0000 mg | ORAL_TABLET | Freq: Every day | ORAL | Status: DC
  Administered 2016-08-02 – 2016-08-17 (×16): 40 mg via ORAL
  Filled 2016-08-02 (×16): qty 1

## 2016-08-02 MED ORDER — ACETAMINOPHEN 325 MG PO TABS
650.0000 mg | ORAL_TABLET | ORAL | Status: DC | PRN
  Administered 2016-08-02 – 2016-08-18 (×24): 650 mg via ORAL
  Filled 2016-08-02 (×25): qty 2

## 2016-08-02 MED ORDER — LISINOPRIL 20 MG PO TABS
20.0000 mg | ORAL_TABLET | Freq: Every day | ORAL | Status: DC
  Administered 2016-08-03 – 2016-08-09 (×7): 20 mg via ORAL
  Filled 2016-08-02 (×7): qty 1

## 2016-08-02 MED ORDER — BISACODYL 10 MG RE SUPP
10.0000 mg | Freq: Every day | RECTAL | Status: DC | PRN
  Administered 2016-08-06 – 2016-08-18 (×3): 10 mg via RECTAL
  Filled 2016-08-02 (×3): qty 1

## 2016-08-02 MED ORDER — FLEET ENEMA 7-19 GM/118ML RE ENEM: 1.0000 | ENEMA | Freq: Once | RECTAL | Status: DC | PRN

## 2016-08-02 MED ORDER — CLONIDINE HCL 0.1 MG PO TABS: 0.1000 mg | ORAL_TABLET | Freq: Four times a day (QID) | ORAL | Status: DC | PRN

## 2016-08-02 MED ORDER — ALUM & MAG HYDROXIDE-SIMETH 200-200-20 MG/5ML PO SUSP: 30.0000 mL | ORAL | Status: DC | PRN

## 2016-08-02 MED ORDER — ACETAMINOPHEN 325 MG PO TABS: 325.0000 mg | ORAL_TABLET | ORAL | Status: DC | PRN

## 2016-08-02 MED ORDER — LORATADINE 10 MG PO TABS
10.0000 mg | ORAL_TABLET | Freq: Every day | ORAL | Status: DC
  Administered 2016-08-03 – 2016-08-18 (×16): 10 mg via ORAL
  Filled 2016-08-02 (×16): qty 1

## 2016-08-02 NOTE — Consult Note (Signed)
[facility] CM Primary Care Navigator  08/02/2016  Thomas Burnett [birth year] [id number]   Met with patient earlier prior to transfer to [facility] inpatient rehab to identify possible discharge needs. Patient states having a fall after feeling numbness and weakness to left side of his body which led to this admission.  Plan for discharge to [facility]) per nurse.  Patient endorses Dr. Lajean Manes with [facility] as the primary care provider and had been seeing him for years now as stated.    Patient shared using [facility] pharmacy at [facility] to obtain medications with no problem. He verbalized that wife Thomas Burnett) assists with medication management straight out of the bottle containers per patient.    Wife provides transportation to his doctors' appointments and she serves as the primary caregiver at home with an aide from  [employer] 3 times a week who assists with his care needs.  Patient expressed understanding to call primary care provider's office when discharged, for a post discharge follow-up appointment within a week or sooner if needed. Patient letter provided as his reminder.  Patient's nurse mentioned that bed is available for patient in CIR and anticipating transfer today.  For additional questions please contact:  Thomas Burnett Date, BSN, RN-BC [facility] PRIMARY CARE Navigator Cell: [phone]

## 2016-08-02 NOTE — Care Management Important Message (Signed)
Important Message  Patient Details  Name: Thomas Burnett MRN: [id number] Date of Birth: [birth year]   Medicare Important Message Given:  Yes    [name] 08/02/2016, 5:27 PM

## 2016-08-02 NOTE — PMR Pre-admission (Signed)
PMR Admission Coordinator Pre-Admission Assessment  Patient: Thomas Burnett is an 82 y.o., male MRN: [id number] DOB: 07/03/1920 Height: 5\' 6"  (167.6 cm) Weight: 84.5 kg (186 lb 4.6 oz)              Insurance Information HMO:     PPO:      PCP:      IPA:      80/20: yes     OTHER:  No HMO PRIMARY: Medicare a and b      Policy#: [id number]      Subscriber: pt Benefits:  Phone #: Passport one online     Name: 08/02/16 Eff. Date: a 06/05/87 and b 10/04/90     Deduct: $1316      Out of Pocket Max: none      Life Max: none CIR: 100%      SNF: 20 full days Outpatient: 80%     Co-Pay: 20% Home Health: 100%      Co-Pay: none DME: 80%     Co-Pay: 20% Providers: pt choice  SECONDARY: [employer] [place]      Policy#: [id number]      Subscriber: pt No authorization required  Medicaid Application Date:       Case Manager:  Disability Application Date:       Case Worker:   Emergency Contact Information Contact Information    Name Relation Home Work Mobile   Older,Beverly  [phone]  [phone]     Current Medical History  Patient Admitting Diagnosis: right internal capsule/thalamic hemorrhage  History of Present Illness:  HPI:  Thomas Burnett is a 82 year old male with history of HTN, prostate cancer, mild AS/AI, CAF--no Xarelto due to allergy; who was admitted on 07/28/16 with left sided weakness and mild left facial weakness with mild dysarthria.  BP elevated in ED and CT head done showing acute right thalamic hemorrhage with mild localized edema. Dr. Erlinda Hong felt that hemorrhage was due to hypertensive emergency and to consider eliquis in the future once ICH resolves. 2D echo with EF 55-60% with grade 1 diastolic dysfunction and moderate aortic stenosis. Carotid dopplers without significant stenosis.  Hydralazine added for better BP control but he developed itching therefore this was changed to Norvasc. Patient with resultant left sided weakness affecting ability to carry out ADL tasks and  mobility.    Total: 6 NIH  Past Medical History  Past Medical History:  Diagnosis Date  . Hypertension   . Mild aortic insufficiency   . Mild aortic sclerosis ([place])   . Obesity   . Osteopenia   . Prostate cancer ([place])    STAGE 2  . Right bundle branch block (RBBB)   . Vitamin D deficiency     Family History  family history is not on file.  Prior Rehab/Hospitalizations:  Has the patient had major surgery during 100 days prior to admission? No  Current Medications   Current Facility-Administered Medications:  .  acetaminophen (TYLENOL) tablet 650 mg, 650 mg, Oral, Q4H PRN, 650 mg at 08/02/16 1135 **OR** [DISCONTINUED] acetaminophen (TYLENOL) suppository 650 mg, 650 mg, Rectal, Q4H PRN, Greta Doom, MD .  amLODipine (NORVASC) tablet 5 mg, 5 mg, Oral, Daily, Rogue Jury, MD, 5 mg at 08/02/16 0909 .  cholecalciferol (VITAMIN D) tablet 1,000 Units, 1,000 Units, Oral, Daily, Rosalin Hawking, MD, 1,000 Units at 08/02/16 0909 .  diphenhydrAMINE (BENADRYL) injection 12.5 mg, 12.5 mg, Intravenous, QHS PRN, Rogue Jury, MD .  enoxaparin (LOVENOX) injection 40 mg, 40  mg, Subcutaneous, Q24H, Rosalin Hawking, MD, 40 mg at 08/02/16 0909 .  furosemide (LASIX) tablet 20 mg, 20 mg, Oral, Daily, Rosalin Hawking, MD, 20 mg at 08/02/16 0909 .  lisinopril (PRINIVIL,ZESTRIL) tablet 20 mg, 20 mg, Oral, Daily, Rosalin Hawking, MD, 20 mg at 08/02/16 0909 .  loratadine (CLARITIN) tablet 10 mg, 10 mg, Oral, Daily, Rosalin Hawking, MD, 10 mg at 08/02/16 0909 .  pantoprazole (PROTONIX) EC tablet 40 mg, 40 mg, Oral, Daily, Rosalin Hawking, MD, 40 mg at 08/02/16 0909 .  pravastatin (PRAVACHOL) tablet 40 mg, 40 mg, Oral, q1800, Rosalin Hawking, MD, 40 mg at 08/01/16 1737 .  senna-docusate (Senokot-S) tablet 1 tablet, 1 tablet, Oral, BID, Greta Doom, MD, 1 tablet at 08/02/16 0909  Patients Current Diet: Diet regular Room service appropriate? Yes; Fluid consistency: Thin  Precautions /  Restrictions Precautions Precautions: Fall Precaution Comments: Skin tear on L elbow from fall Restrictions Weight Bearing Restrictions: No   Has the patient had 2 or more falls or a fall with injury in the past year?No Fell in bathroom during this CVA only  Prior Activity Level Limited Community (1-2x/wk): does not drive due to macular degeneration. Sedentary but Independent with all ADLS. Uses rollator. Typically sleeps and sits in his lazy boy recliner with electric controls. Does his own BADLS and ambulated with rollator. Cognitively intact. 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   Home Assistive Devices / Equipment Home Assistive Devices/Equipment: Eyeglasses, Dentures (specify type), Walker (specify type) Home Equipment: Walker - 4 wheels, Grab bars - tub/shower  Prior Device Use: Indicate devices/aids used by the patient prior to current illness, exacerbation or injury? Walker  Prior Functional Level Prior Function Level of Independence: Independent with assistive device(s) Comments: rollator for all mobility. Independent with BADL  Self Care: Did the patient need help bathing, dressing, using the toilet or eating?  Independent  Indoor Mobility: Did the patient need assistance with walking from room to room (with or without device)? Independent  Stairs: Did the patient need assistance with internal or external stairs (with or without device)? Independent  Functional Cognition: Did the patient need help planning regular tasks such as shopping or remembering to take medications? Needed some help  Current Functional Level Cognition  Arousal/Alertness: Awake/alert Overall Cognitive Status: Within Functional Limits for tasks assessed Orientation Level: Oriented X4 Attention: Sustained Sustained Attention: Appears intact Memory: Appears intact Awareness: Appears intact Problem Solving: Appears intact Safety/Judgment: Appears intact    Extremity Assessment (includes  Sensation/Coordination)  Upper Extremity Assessment: Defer to OT evaluation LUE Deficits / Details: Grossly 3+/5. Poor gross motor coordination with ataxic movement. Pt does report impaired sensation.  LUE Sensation: decreased light touch LUE Coordination: decreased fine motor, decreased gross motor  Lower Extremity Assessment: LLE deficits/detail LLE Deficits / Details: hip flexion 3/5, knee extension 4-/5, ankle DF 4-/5 LLE Sensation: decreased light touch    ADLs  Overall ADL's : Needs assistance/impaired Grooming: Minimal assistance, Sitting Upper Body Bathing: Moderate assistance, Sitting Lower Body Bathing: +2 for physical assistance, Sit to/from stand, Maximal assistance Upper Body Dressing : Moderate assistance, Sitting Upper Body Dressing Details (indicate cue type and reason): to don hospital gown Lower Body Dressing: Maximal assistance, +2 for physical assistance, Sit to/from stand Functional mobility during ADLs: Maximal assistance, +2 for physical assistance (for sit to stand only) General ADL Comments: Pt required consistent verbal cues for upright posture in standing and to facilitate weight shift to R side. Educated pt and family on protection of LUE and  keeping it on pillow for safety.    Mobility  General bed mobility comments: OOB in recliner--pt sleeps in recliner    Transfers  Overall transfer level: Needs assistance Equipment used:  (held lower bed rail) Transfers: Sit to/from Stand Sit to Stand: Max assist, +2 physical assistance General transfer comment: He used extension to scoot forward to [place] with increased time to then lean forward mainly using RUE on arm of recliner. Pt then used both hands on bed rail in front of him to pull up to standing with min A +2 first time; minguard assist 2nd time. Did this due to pt with decreased coordination pushing up from recliner very well and difficult transition from hands on chair arms up to bed rail due to ataxic LUE and  tendency to lean right.-- per prior notes pt max A +2 sit to stand with pushing up. Patient required +2 max for stand to sit (LUE ataxic and unable to help slow his descent and patient "just flops" backwards.    Ambulation / Gait / Stairs / Wheelchair Mobility  Ambulation/Gait General Gait Details: unable to ambulate, pre-gait only    Posture / Balance Dynamic Sitting Balance Sitting balance - Comments: using Bil UEs on bed rail to help him maintain forward sitting posture in recliner Balance Overall balance assessment: Needs assistance Sitting-balance support: Bilateral upper extremity supported, Feet supported Sitting balance-Leahy Scale: Poor Sitting balance - Comments: using Bil UEs on bed rail to help him maintain forward sitting posture in recliner Postural control: Posterior lean, Right lateral lean Standing balance support: Bilateral upper extremity supported Standing balance-Leahy Scale: Zero Standing balance comment: In standing pt with cues for upright posture and verbal and tactile cues for left<>right weight shifts. The longer he stood the more he leaned right and with "sinking" posture and LUE sliding down bed rail    Special needs/care consideration BiPAP/CPAP n/a CPM  N/a Continuous Drip IV  N/a Dialysis  N/a Life Vest  N/a Oxygen  N/a Special Bed   N/a Trach Size n/a Wound Vac (area) n/a Skin skin tears to left elbow area from fall sustained during this CVA on admission from home Bowel mgmt: LBM 10/29 continent Bladder mgmt: continent Diabetic mgmt Hgb A1c 5.8 Patient sleeps in his recliner , not in a bed. Chronic pain to LLE Macular degeneration   Previous Home Environment Living Arrangements: Spouse/significant other  Lives With: Spouse Available Help at Discharge: Family, Personal care attendant, Available 24 hours/day Type of Home: House Home Layout: Two level (one level with a basement) Home Access: Level entry Bathroom Shower/Tub: Tub/shower unit,  Walk-in shower (pt typically uses tub/shower; has grab bars) Bathroom Toilet: Handicapped height Bathroom Accessibility: Yes How Accessible: Accessible via walker [place]: Yes Type of Home Care Services: Homehealth aide (3 hrs per day three days per week) [place] (if known): [facility] Additional Comments: aide does lifting, laundry, etc. does not have to assist pt with his adls  Discharge Living Setting Plans for Discharge Living Setting: Patient's home, Lives with (comment) (wife of 41 years of marriage) Type of Home at Discharge: House Discharge Home Layout: Two level (one level with basement) Discharge Home Access: Level entry Discharge Bathroom Shower/Tub: Tub/shower unit, Walk-in shower (pt used the tub/shower pta; willing to change to use walk in) Discharge Bathroom Toilet: Handicapped height Discharge Bathroom Accessibility: Yes How Accessible: Accessible via walker Does the patient have any problems obtaining your medications?: No  Social/Family/Support Systems Patient Roles: Spouse,  Parent (has two daughters; one in [place] and one in [place]) [place] Information: Luismanuel Shontz, wife Anticipated Caregiver: wife and hired assist as needed Equities trader Information: see above Ability/Limitations of Caregiver: wife has bad right knee, but younger and can provide supervision level Caregiver Availability: 24/7 Discharge Plan Discussed with Primary Caregiver: Yes Is Caregiver In Agreement with Plan?: Yes Does Caregiver/Family have Issues with Lodging/Transportation while Pt is in Rehab?: No  Goals/Additional Needs Patient/Family Goal for Rehab: supervision to min with PT and OT, Mod I with SLP Expected length of stay: [facility] 18-20 days Special Service Needs: Patient has macular degeneration Pt/Family Agrees to Admission and willing to participate: Yes Program Orientation Provided & Reviewed with Pt/Caregiver Including Roles  &  Responsibilities: Yes  Decrease burden of Care through IP rehab admission: n/a  Possible need for SNF placement upon discharge:not anticipated. Wife will hire additional assistance at home as recommended by Rehab Team.  Patient Condition: This patient's condition remains as documented in the consult dated 07/31/2016, in which the Rehabilitation Physician determined and documented that the patient's condition is appropriate for intensive rehabilitative care in an inpatient rehabilitation facility. Will admit to inpatient rehab today.   Preadmission Screen Completed By:  Cleatrice Burke, 08/02/2016 12:26 PM ______________________________________________________________________   Discussed status with Dr. Posey Pronto on 08/02/2016 at  1235 and received telephone approval for admission today.  Admission Coordinator:  Cleatrice Burke, time [id number] Date 08/02/2016.

## 2016-08-02 NOTE — Progress Notes (Signed)
Occupational Therapy Treatment Patient Details Name: Thomas Burnett MRN: [id number] DOB: May 05, 1920 Today's Date: 08/02/2016    History of present illness 82 y.o.malewith a history of hypertension presenting with left sided weakness. Upon EMS evaluation, pt with blood pressure in 220s. CT on 10/25 positive for acute right thalamic hemorrhage.   OT comments  This 82 yo male admitted with above presents to acute OT making progress with precursor to basic ADLs and attempting to use his LUE as much as he can. He will continue to benefit from acute OT with follow up OT on CIR.  Follow Up Recommendations  CIR;Supervision/Assistance - 24 hour    Equipment Recommendations  Other (comment) (TBD at next venue)    Recommendations for Other Services Rehab consult    Precautions / Restrictions Precautions Precautions: Fall Precaution Comments: Skin tear on L elbow from fall Restrictions Weight Bearing Restrictions: No       Mobility Bed Mobility               General bed mobility comments: OOB in recliner--pt sleeps in recliner  Transfers Overall transfer level: Needs assistance               General transfer comment: He used extension to scoot forward to [place] with increased time to then lean forward mainly using RUE on arm of recliner. Pt then used both hands on bed rail in front of him to pull up to standing with minguard A +2. Did this due to pt with decreased coordination pushing up from recliner very well and difficult transition from hands on chair arms up to bed rail due to ataxic LUE and tendency to lean right.-- per prior notes pt max A +2 sit to stand with pushing up. In standing pt with cues for upright posture and verbal and tactile cues for left<>right weight shifts. The longer he stood the more he leaned right and with "sinking" posture and LUE sliding down bed rail    Balance Overall balance assessment: Needs assistance Sitting-balance support: Feet  supported;Bilateral upper extremity supported Sitting balance-Leahy Scale: Poor Sitting balance - Comments: using Bil UEs on bed rail to help him maintain forward sitting posture in recliner Postural control: Posterior lean;Right lateral lean Standing balance support: Bilateral upper extremity supported Standing balance-Leahy Scale: Zero Standing balance comment: requires Bil UE support in standing with cues for upright posture (prior to admission pt with bent over posture when up on his feet).                    ADL Overall ADL's : Needs assistance/impaired                                                        Cognition   Behavior During Therapy: WFL for tasks assessed/performed Overall Cognitive Status: Within Functional Limits for tasks assessed                         Exercises Other Exercises Other Exercises: Had pt work on transferring one of his grooming item bottles from right <>left hand. 10 reps (5 and 5). Also had him do resistance palms against palms of our hands and then resistance with up and down movements (palms against palms). Pt continues with decreased coordination and strength of LUE  Pertinent Vitals/ Pain       Pain Assessment: Faces Faces Pain Scale: Hurts even more Pain Location: LLE (thigh and calf--but not at same time)  Pain Descriptors / Indicators: Cramping Pain Intervention(s): Monitored during session;Repositioned;Limited activity within patient's tolerance         Frequency  Min 2X/week        Progress Toward Goals  OT Goals(current goals can now be found in the care plan section)  Progress towards OT goals: Progressing toward goals     Plan Discharge plan remains appropriate    Co-evaluation    PT/OT/SLP Co-Evaluation/Treatment: Yes Reason for Co-Treatment: Complexity of the patient's impairments (multi-system involvement);For patient/therapist safety   OT goals addressed during  session: Strengthening/ROM      End of Session Equipment Utilized During Treatment: Gait belt   Activity Tolerance Patient limited by fatigue   Patient Left in chair;with call bell/phone within reach;with chair alarm set;with family/visitor present   Nurse Communication  (bandage on left elbow coming off)        Time: [id number] OT Time Calculation (min): 31 min  Charges: OT General Charges $OT Visit: 1 Procedure OT Treatments $Therapeutic Activity: 8-22 mins  Almon Register [id number] 08/02/2016, 11:19 AM

## 2016-08-02 NOTE — Progress Notes (Signed)
Thomas Gong, RN Rehab Admission Coordinator Signed Physical Medicine and Rehabilitation  PMR Pre-admission Date of Service: 08/02/2016 12:26 PM  Related encounter: ED to Hosp-Admission (Discharged) from 07/28/2016 in [place]       [] Hide copied text PMR Admission Coordinator Pre-Admission Assessment  Patient: Thomas Burnett is an 82 y.o., male MRN: [id number] DOB: [birth year] Height: 5\' 6"  (167.6 cm) Weight: 84.5 kg (186 lb 4.6 oz)                                                                                                                                                  Insurance Information HMO:     PPO:      PCP:      IPA:      80/20: yes     OTHER:  No HMO PRIMARY: Medicare a and b      Policy#: [id number]      Subscriber: pt Benefits:  Phone #: Passport one online     Name: 08/02/16 Eff. Date: a 06/05/87 and b 10/04/90     Deduct: $1316      Out of Pocket Max: none      Life Max: none CIR: 100%      SNF: 20 full days Outpatient: 80%     Co-Pay: 20% Home Health: 100%      Co-Pay: none DME: 80%     Co-Pay: 20% Providers: pt choice  SECONDARY: [employer] of [place]      Policy#: [id number]      Subscriber: pt No authorization required  Medicaid Application Date:       Case Manager:  Disability Application Date:       Case Worker:   Emergency Contact Information        Contact Information    Name Relation Home Work Mobile   Loschiavo,Beverly  [phone]  [phone]     Current Medical History  Patient Admitting Diagnosis: right internal capsule/thalamic hemorrhage  History of Present Illness:  HPI: Javani Pfautz is a 82 year old male with history of HTN, prostate cancer, mild AS/AI, CAF--no Xarelto due to allergy; who was admitted on 07/28/16 with left sided weakness and mild left facial weakness with mild dysarthria. BP elevated in ED and CT head done showing acute right thalamic hemorrhage with mild localized  edema. Dr. Erlinda Hong felt that hemorrhage was due to hypertensive emergency and to consider eliquis in the future once ICH resolves. 2D echo with EF 55-60% with grade 1 diastolic dysfunction and moderate aortic stenosis. Carotid dopplers without significant stenosis. Hydralazine added for better BP control but he developed itching therefore this was changed to Norvasc. Patient with resultant left sided weakness affecting ability to carry out ADL tasks and mobility.    Total: 6 NIH  Past Medical History  Past Medical History:  Diagnosis Date  . Hypertension   . Mild aortic insufficiency   . Mild aortic sclerosis ([place])   . Obesity   . Osteopenia   . Prostate cancer ([place])    STAGE 2  . Right bundle branch block (RBBB)   . Vitamin D deficiency     Family History  family history is not on file.  Prior Rehab/Hospitalizations:  Has the patient had major surgery during 100 days prior to admission? No  Current Medications   Current Facility-Administered Medications:  .  acetaminophen (TYLENOL) tablet 650 mg, 650 mg, Oral, Q4H PRN, 650 mg at 08/02/16 1135 **OR** [DISCONTINUED] acetaminophen (TYLENOL) suppository 650 mg, 650 mg, Rectal, Q4H PRN, Greta Doom, MD .  amLODipine (NORVASC) tablet 5 mg, 5 mg, Oral, Daily, Rogue Jury, MD, 5 mg at 08/02/16 0909 .  cholecalciferol (VITAMIN D) tablet 1,000 Units, 1,000 Units, Oral, Daily, Rosalin Hawking, MD, 1,000 Units at 08/02/16 0909 .  diphenhydrAMINE (BENADRYL) injection 12.5 mg, 12.5 mg, Intravenous, QHS PRN, Rogue Jury, MD .  enoxaparin (LOVENOX) injection 40 mg, 40 mg, Subcutaneous, Q24H, Rosalin Hawking, MD, 40 mg at 08/02/16 0909 .  furosemide (LASIX) tablet 20 mg, 20 mg, Oral, Daily, Rosalin Hawking, MD, 20 mg at 08/02/16 0909 .  lisinopril (PRINIVIL,ZESTRIL) tablet 20 mg, 20 mg, Oral, Daily, Rosalin Hawking, MD, 20 mg at 08/02/16 0909 .  loratadine (CLARITIN) tablet 10 mg, 10 mg, Oral, Daily, Rosalin Hawking, MD, 10 mg at  08/02/16 0909 .  pantoprazole (PROTONIX) EC tablet 40 mg, 40 mg, Oral, Daily, Rosalin Hawking, MD, 40 mg at 08/02/16 0909 .  pravastatin (PRAVACHOL) tablet 40 mg, 40 mg, Oral, q1800, Rosalin Hawking, MD, 40 mg at 08/01/16 1737 .  senna-docusate (Senokot-S) tablet 1 tablet, 1 tablet, Oral, BID, Greta Doom, MD, 1 tablet at 08/02/16 0909  Patients Current Diet: Diet regular Room service appropriate? Yes; Fluid consistency: Thin  Precautions / Restrictions Precautions Precautions: Fall Precaution Comments: Skin tear on L elbow from fall Restrictions Weight Bearing Restrictions: No   Has the patient had 2 or more falls or a fall with injury in the past year?No Fell in bathroom during this CVA only  Prior Activity Level Limited Community (1-2x/wk): does not drive due to macular degeneration. Sedentary but Independent with all ADLS. Uses rollator. Typically sleeps and sits in his lazy boy recliner with electric controls. Does his own BADLS and ambulated with rollator. Cognitively intact. 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   Home Assistive Devices / Equipment Home Assistive Devices/Equipment: Eyeglasses, Dentures (specify type), Walker (specify type) Home Equipment: Walker - 4 wheels, Grab bars - tub/shower  Prior Device Use: Indicate devices/aids used by the patient prior to current illness, exacerbation or injury? Walker  Prior Functional Level Prior Function Level of Independence: Independent with assistive device(s) Comments: rollator for all mobility. Independent with BADL  Self Care: Did the patient need help bathing, dressing, using the toilet or eating?  Independent  Indoor Mobility: Did the patient need assistance with walking from room to room (with or without device)? Independent  Stairs: Did the patient need assistance with internal or external stairs (with or without device)? Independent  Functional Cognition: Did the patient need help planning regular tasks  such as shopping or remembering to take medications? Needed some help  Current Functional Level Cognition Arousal/Alertness: Awake/alert Overall Cognitive Status: Within Functional Limits for tasks assessed Orientation Level: Oriented X4 Attention: Sustained Sustained Attention: Appears intact Memory: Appears intact Awareness: Appears intact  Problem Solving: Appears intact Safety/Judgment: Appears intact    Extremity Assessment (includes Sensation/Coordination) Upper Extremity Assessment: Defer to OT evaluation LUE Deficits / Details: Grossly 3+/5. Poor gross motor coordination with ataxic movement. Pt does report impaired sensation.  LUE Sensation: decreased light touch LUE Coordination: decreased fine motor, decreased gross motor  Lower Extremity Assessment: LLE deficits/detail LLE Deficits / Details: hip flexion 3/5, knee extension 4-/5, ankle DF 4-/5 LLE Sensation: decreased light touch   ADLs Overall ADL's : Needs assistance/impaired Grooming: Minimal assistance, Sitting Upper Body Bathing: Moderate assistance, Sitting Lower Body Bathing: +2 for physical assistance, Sit to/from stand, Maximal assistance Upper Body Dressing : Moderate assistance, Sitting Upper Body Dressing Details (indicate cue type and reason): to don hospital gown Lower Body Dressing: Maximal assistance, +2 for physical assistance, Sit to/from stand Functional mobility during ADLs: Maximal assistance, +2 for physical assistance (for sit to stand only) General ADL Comments: Pt required consistent verbal cues for upright posture in standing and to facilitate weight shift to R side. Educated pt and family on protection of LUE and keeping it on pillow for safety.   Mobility General bed mobility comments: OOB in recliner--pt sleeps in recliner   Transfers Overall transfer level: Needs assistance Equipment used:  (held lower bed rail) Transfers: Sit to/from Stand Sit to Stand: Max assist, +2 physical  assistance General transfer comment: He used extension to scoot forward to [place] with increased time to then lean forward mainly using RUE on arm of recliner. Pt then used both hands on bed rail in front of him to pull up to standing with min A +2 first time; minguard assist 2nd time. Did this due to pt with decreased coordination pushing up from recliner very well and difficult transition from hands on chair arms up to bed rail due to ataxic LUE and tendency to lean right.-- per prior notes pt max A +2 sit to stand with pushing up. Patient required +2 max for stand to sit (LUE ataxic and unable to help slow his descent and patient "just flops" backwards.   Ambulation / Gait / Stairs / Wheelchair Mobility Ambulation/Gait General Gait Details: unable to ambulate, pre-gait only   Posture / Balance Dynamic Sitting Balance Sitting balance - Comments: using Bil UEs on bed rail to help him maintain forward sitting posture in recliner Balance Overall balance assessment: Needs assistance Sitting-balance support: Bilateral upper extremity supported, Feet supported Sitting balance-Leahy Scale: Poor Sitting balance - Comments: using Bil UEs on bed rail to help him maintain forward sitting posture in recliner Postural control: Posterior lean, Right lateral lean Standing balance support: Bilateral upper extremity supported Standing balance-Leahy Scale: Zero Standing balance comment: In standing pt with cues for upright posture and verbal and tactile cues for left<>right weight shifts. The longer he stood the more he leaned right and with "sinking" posture and LUE sliding down bed rail   Special needs/care consideration BiPAP/CPAP n/a CPM  N/a Continuous Drip IV  N/a Dialysis  N/a Life Vest  N/a Oxygen  N/a Special Bed   N/a Trach Size n/a Wound Vac (area) n/a Skin skin tears to left elbow area from fall sustained during this CVA on admission from home Bowel mgmt: LBM 10/29 continent Bladder mgmt:  continent Diabetic mgmt Hgb A1c 5.8 Patient sleeps in his recliner , not in a bed. Chronic pain to LLE Macular degeneration   Previous Home Environment Living Arrangements: Spouse/significant other  Lives With: Spouse Available Help at Discharge: Family, Personal care attendant, Available 24  hours/day Type of Home: House Home Layout: Two level (one level with a basement) Home Access: Level entry Bathroom Shower/Tub: Tub/shower unit, Walk-in shower (pt typically uses tub/shower; has grab bars) Bathroom Toilet: Handicapped height Bathroom Accessibility: Yes How Accessible: Accessible via walker Home Care Services: Yes Type of Home Care Services: Homehealth aide (3 hrs per day three days per week) [place] (if known): [facility] Additional Comments: aide does lifting, laundry, etc. does not have to assist pt with his adls  Discharge Living Setting Plans for Discharge Living Setting: Patient's home, Lives with (comment) (wife of 45 years of marriage) Type of Home at Discharge: House Discharge Home Layout: Two level (one level with basement) Discharge Home Access: Level entry Discharge Bathroom Shower/Tub: Tub/shower unit, Walk-in shower (pt used the tub/shower pta; willing to change to use walk in) Discharge Bathroom Toilet: Handicapped height Discharge Bathroom Accessibility: Yes How Accessible: Accessible via walker Does the patient have any problems obtaining your medications?: No  Social/Family/Support Systems Patient Roles: Spouse, Parent (has two daughters; one in [place] and one in [place]) [place]: Read Drivers, wife Anticipated Caregiver: wife and hired assist as needed Equities trader Information: see above Ability/Limitations of Caregiver: wife has bad right knee, but younger and can provide supervision level Caregiver Availability: 24/7 Discharge Plan Discussed with Primary Caregiver: Yes Is Caregiver In Agreement  with Plan?: Yes Does Caregiver/Family have Issues with Lodging/Transportation while Pt is in Rehab?: No  Goals/Additional Needs Patient/Family Goal for Rehab: supervision to min with PT and OT, Mod I with SLP Expected length of stay: [facility] 18-20 days Special Service Needs: Patient has macular degeneration Pt/Family Agrees to Admission and willing to participate: Yes Program Orientation Provided & Reviewed with Pt/Caregiver Including Roles  & Responsibilities: Yes  Decrease burden of Care through IP rehab admission: n/a  Possible need for SNF placement upon discharge:not anticipated. Wife will hire additional assistance at home as recommended by Rehab Team.  Patient Condition: This patient's condition remains as documented in the consult dated 07/31/2016, in which the Rehabilitation Physician determined and documented that the patient's condition is appropriate for intensive rehabilitative care in an inpatient rehabilitation facility. Will admit to inpatient rehab today.   Preadmission Screen Completed By:  Cleatrice Burke, 08/02/2016 12:26 PM ______________________________________________________________________   Discussed status with Dr. Posey Pronto on 08/02/2016 at  1235 and received telephone approval for admission today.  Admission Coordinator:  Cleatrice Burke, time [id number] Date 08/02/2016.       Cosigned by: Ankit Lorie Phenix, MD at 08/02/2016 12:47 PM  Revision History

## 2016-08-02 NOTE — Discharge Summary (Signed)
Stroke Discharge Summary  Patient ID: Thomas Burnett   MRN: [id number]      DOB: Sep 29, 1920  Date of Admission: 07/28/2016 Date of Discharge: 08/02/2016  Attending Physician:  Garvin Fila, MD, Stroke MD Consulting Physician(s):   Alger Simons, MD (Physical Medicine & Rehabtilitation) Patient's PCP:  No primary care provider on file.  Discharge Diagnoses:  Principal Problem:   ICH (intracerebral hemorrhage) ([place]) - small R thalamic d/t HTN Active Problems:   Hypertensive emergency   Chronic a-fib ([facility])   Hyperlipidemia   Obesity   Prostate cancer ([place])   Right bundle branch block BMI  Body mass index is 30.07 kg/m.   Past Medical History:  Diagnosis Date  . Hypertension   . Mild aortic insufficiency   . Mild aortic sclerosis ([place])   . Obesity   . Osteopenia   . Prostate cancer ([place])    STAGE 2  . Right bundle branch block (RBBB)   . Vitamin D deficiency    History reviewed. No pertinent surgical history.  Medications to be continued on Rehab . amLODipine  5 mg Oral Daily  . cholecalciferol  1,000 Units Oral Daily  . enoxaparin (LOVENOX) injection  40 mg Subcutaneous Q24H  . furosemide  20 mg Oral Daily  . lisinopril  20 mg Oral Daily  . loratadine  10 mg Oral Daily  . pantoprazole  40 mg Oral Daily  . pravastatin  40 mg Oral q1800  . senna-docusate  1 tablet Oral BID    LABORATORY STUDIES CBC    Component Value Date/Time   WBC 8.5 08/01/2016 0306   RBC 4.71 08/01/2016 0306   HGB 14.9 08/01/2016 0306   HCT 44.2 08/01/2016 0306   PLT 178 08/01/2016 0306   MCV 93.8 08/01/2016 0306   MCH 31.6 08/01/2016 0306   MCHC 33.7 08/01/2016 0306   RDW 14.5 08/01/2016 0306   LYMPHSABS 2.4 07/28/2016 2010   MONOABS 0.7 07/28/2016 2010   EOSABS 0.2 07/28/2016 2010   BASOSABS 0.1 07/28/2016 2010   CMP    Component Value Date/Time   NA 140 08/01/2016 0306   K 4.0 08/01/2016 0306   CL 108 08/01/2016 0306   CO2 25 08/01/2016 0306   GLUCOSE 125 (H)  08/01/2016 0306   BUN 25 (H) 08/01/2016 0306   CREATININE 1.12 08/01/2016 0306   CALCIUM 9.6 08/01/2016 0306   PROT 6.7 07/28/2016 2010   ALBUMIN 3.9 07/28/2016 2010   AST 30 07/28/2016 2010   ALT 21 07/28/2016 2010   ALKPHOS 61 07/28/2016 2010   BILITOT 0.8 07/28/2016 2010   GFRNONAA 54 (L) 08/01/2016 0306   GFRAA >60 08/01/2016 0306   COAGS Lab Results  Component Value Date   INR 0.98 07/28/2016   Lipid Panel    Component Value Date/Time   CHOL 220 (H) 07/29/2016 1012   TRIG 85 07/30/2016 0507   HDL 61 07/29/2016 1012   CHOLHDL 3.6 07/29/2016 1012   VLDL 13 07/29/2016 1012   LDLCALC 146 (H) 07/29/2016 1012   HgbA1C  Lab Results  Component Value Date   HGBA1C 5.7 (H) 07/30/2016   Urinalysis    Component Value Date/Time   COLORURINE YELLOW 07/28/2016 0546   APPEARANCEUR CLEAR 07/28/2016 0546   LABSPEC 1.018 07/28/2016 0546   PHURINE 7.0 07/28/2016 0546   GLUCOSEU NEGATIVE 07/28/2016 0546   HGBUR NEGATIVE 07/28/2016 0546   BILIRUBINUR NEGATIVE 07/28/2016 0546   KETONESUR NEGATIVE 07/28/2016 0546   PROTEINUR 30 (  A) 07/28/2016 0546   NITRITE NEGATIVE 07/28/2016 0546   LEUKOCYTESUR NEGATIVE 07/28/2016 0546   Urine Drug Screen     Component Value Date/Time   LABOPIA NONE DETECTED 07/28/2016 0546   COCAINSCRNUR NONE DETECTED 07/28/2016 0546   LABBENZ NONE DETECTED 07/28/2016 0546   AMPHETMU NONE DETECTED 07/28/2016 0546   THCU NONE DETECTED 07/28/2016 0546   LABBARB NONE DETECTED 07/28/2016 0546     SIGNIFICANT DIAGNOSTIC STUDIES  Ct Head Code Stroke Wo Contrast 07/29/2016 Unchanged right internal capsule and thalamic hematoma. No significant mass effect. No new abnormality. Electronically Signed   By: Monte Fantasia M.D.   On: 07/29/2016 16:28  07/28/2016 1. 13 x 15 x 25 mm acute right thalamic parenchymal hemorrhage, estimated volume 2.4 cc. Mild localized edema without significant mass effect. No intraventricular extension. Hypertensive etiology is  suspected. 2. Generalized age-related cerebral atrophy with chronic microvascular ischemic disease. The covering stroke neurologist has been paged at the time of this dictation (approximately 8:50 p.m. on 07/28/2016). Currently awaiting a call back. Findings will be conveyed as soon as possible. Electronically Signed   By: Jeannine Boga M.D.   On: 07/28/2016 20:55    CUS  07/29/2016 Findings are consistent with a 1-39 percent stenosis involving the right internal carotid artery and the left internal carotid artery. The vertebral arteries demonstrate antegrade flow.  TTE  07/29/2016 - Left ventricle: The cavity size was normal. Wall thickness wasincreased in a pattern of moderate LVH. Systolic function wasnormal. The estimated ejection fraction was in the range of 55%to 60%. Regional wall motion abnormalities cannot be excluded.Doppler parameters are consistent with abnormal left ventricularrelaxation (grade 1 diastolic dysfunction). Doppler parametersare consistent with high ventricular filling pressure. - Aortic valve: Valve mobility was restricted. There was moderatestenosis. Valve area (VTI): 1.49 cm^2. Valve area (Vmax): 1.27cm^2. Valve area (Vmean): 1.29 cm^2. - Mitral valve: Calcified annulus. There was mild regurgitation. - Left atrium: The atrium was mildly dilated. Impressions: - Technically difficult; normal LV systolic function; grade 1diastolic dysfunction; elevated LV filling pressure; calcifiedaortic valve with moderate AS (mean gradient 24 mmHg); mild MR;mild LAE.  Ct Cervical Spine Wo Contrast 07/28/2016 No acute/traumatic cervical spine pathology. Multilevel degenerative changes. Electronically Signed   By: Anner Crete M.D.   On: 07/28/2016 23:28   Dg Chest Portable 1 View 07/28/2016 No acute abnormality noted. Electronically Signed   By: Inez Catalina M.D.   On: 07/28/2016 21:03      HISTORY OF PRESENT ILLNESS Thomas Alfaro Johnsonis a 82 y.o.malewith  a history of hypertension who is only currently taking Lasix. He was in his normal state of health until 19:15 07/28/2016 ([facility]) at which time he developed left-sided weakness. EMS responded and found him to have left-sided weakness and therefore called a code stroke and brought him into the emergency department. On their initial evaluation, he had blood pressures in the 220s. In CT, he was observed to have a small thalamic hemorrhage. He was given labetalol, and initially seemed to have a good response but subsequently his blood pressure went back up. ICH Score: 1. He was admitted to the neuro ICU for further evaluation and treatment.   HOSPITAL COURSE Thomas Burnett is a 82 y.o. male with history of HTN presenting with L sided weakness and hypertensive emergency. CT showed R thalamic hemorrhage. He remained stable with no increase in bleeding. He was transferred to the floor. Therapy recommends inpatient rehabilitation and he will be discharged there.  ICH:  right small thalamic  hemorrhage secondary to hypertensive emergency  Resultant  LUE weakness and incoordination  CT - R thalamic hemorrhage, 2.4cc  Repeat CT head - no significant change  Carotid Doppler - unremarkable  2D Echo - EF 55-60%. No cardiac source of emboli identified.  LDL - 146  HgbA1c - 5.8  aspirin 81 mg daily prior to admission. Had been in [place] for afib but stopped due to allergy. Now on no antithrombotics due to [place]. May consider eliquis in the future due to afib once ICH resolves.  Ongoing aggressive stroke risk factor management  On admission, Patient shared he does not desire surgical procedures  Therapy recommendations:  CIR   Disposition:  CIR  Hypertensive Emergency  SBP 220s on arrival in setting of neurologic emergency  Treated with cleviprex, stable off  Lasix at home  Resume lasix and put on hydralazine as well as lisinopril for BP control  SBP goal < 160 in  hospital  Long-term BP goal normotensive  Chronic afib  Has tried Xarelto in the past, stopped due to rash and hives  Currently in afib on tele  No anticoagulation now due to [place]  May consider eliquis in the future once ICH resolves  Hyperlipidemia  Home meds: no statin  LDL 146, goal < 70  Put on pravastatin 40mg    Continue statin on discharge.  Other Stroke Risk Factors  Advanced age  ETOH use, advised to drink no more than 2 drink(s) a day  Obesity, Body mass index is 30.07 kg/m.  recommend weight loss, diet and exercise as appropriate   Other Active Problems  Stage 2 prostate cancer  RBBB  Baseline LLE weakness and walk with walker    DISCHARGE EXAM Blood pressure 137/70, pulse (!) 57, temperature 98.1 F (36.7 C), temperature source Oral, resp. rate 20, height 5\' 6"  (1.676 m), weight 84.5 kg (186 lb 4.6 oz), SpO2 97 %. General - Well nourished, well developed, in no apparent distress. Ophthalmologic - Fundi not visualized due to noncooperation. Cardiovascular - irregularly irregular heart rate and rhythm. Mental Status -  Level of arousal and orientation to time, place, and person were intact. Language including expression, naming, repetition, comprehension was assessed and found intact, mild dysarthria. Fund of Knowledge was assessed and was intact. Cranial Nerves II - XII - II - Visual field intact OU. III, IV, VI - Extraocular movements intact. V - Facial sensation intact bilaterally. VII - mild left facial droop. VIII - Hearing & vestibular intact bilaterally. X - Palate elevates symmetrically, mild dysarthria. XI - Chin turning & shoulder shrug intact bilaterally. XII - Tongue protrusion intact. Motor Strength - The patient's strength was normal in RUE and RLE, but 3/5 LUE and 4/5 LLE .  Bulk was normal and fasciculations were absent.   Motor Tone - Muscle tone was assessed at the neck and appendages and was normal. Reflexes - The patient's  reflexes were 1+ in all extremities and he had no pathological reflexes. Sensory - Light touch, temperature/pinprick were assessed and were decreased on the LUE.   Coordination - The patient had significant LUE ataxia.  Tremor was absent.  Discharge Diet  Diet regular Room service appropriate? Yes; Fluid consistency: Thin liquids  DISCHARGE PLAN  Disposition:  Transfer to [facility] for ongoing PT, OT and ST  May consider eliquis in the future once ICH resolves  Recommend ongoing risk factor control by Primary Care Physician at time of discharge from inpatient rehabilitation.  Follow-up No primary care  provider on file. in 2 weeks following discharge from rehab.  Follow-up with Dr. Antony Contras, Stroke Clinic in 2 months, office to schedule an appointment.   35 minutes were spent preparing discharge.  Ali Chuk Melmore for Pager information 08/02/2016 1:38 PM  I have personally examined this patient, reviewed notes, independently viewed imaging studies, participated in medical decision making and plan of care.ROS completed by me personally and pertinent positives fully documented  I have made any additions or clarifications directly to the above note. Agree with note above.    Antony Contras, MD Medical Director [facility] Pager: [phone] 08/02/2016 4:30 PM

## 2016-08-02 NOTE — Progress Notes (Signed)
I met with pt and his wife at bedside to discuss an inpt rehab admission. They are both in agreement. I contacted attending service to make the arrangements. RN CM and SW are aware. [phone]

## 2016-08-02 NOTE — H&P (Signed)
Physical Medicine and Rehabilitation Admission H&P    Chief Complaint  Patient presents with  . Left sided weakness.     HPI:  Thomas Burnett is a 82 year old male with history fo HTN, prostate cancer, mild AS/AI, CAF--no Xarelto due to allergy; who was admitted on 07/28/16 with left sided weakness and mild left facial weakness with mild dysarthria.  BP elevated in ED and CT head done showing acute right thalamic hemorrhage with mild localized edema. Dr. Erlinda Hong felt that hemorrhage was due to hypertensive emergency and to consider eliquis in the future once ICH resolves. 2D echo with EF 55-60% with grade 1 diastolic dysfunction and moderate aortic stenosis. Carotid dopplers without significant stenosis.  Hydralazine added for better BP control but he developed itching therefore this was changed to Norvasc. Patient with resultant left sided weakness affecting ability to carry out ADL tasks and mobility. CIR recommended for follow up therapy.    ROS  Constitutional: Negative for fever and chills.  Eyes: Positive for double vision.  Respiratory: Negative for cough.   Gastrointestinal: Negative for heartburn.  Genitourinary: Negative for dysuria.  Musculoskeletal: Positive for gait abnormality Neurological: Positive for focal weakness and headaches.  Psychiatric/Behavioral: Negative for depression.  All other systems reviewed and negative.   Past Medical History:  Diagnosis Date  . Hypertension   . Mild aortic insufficiency   . Mild aortic sclerosis ([place])   . Obesity   . Osteopenia   . Prostate cancer ([place])    STAGE 2  . Right bundle branch block (RBBB)   . Vitamin D deficiency    History reviewed. No pertinent surgical history related to stroke. History reviewed. No pertinent family history related to early onset stroke or MI.  Social History:  reports that he has quit smoking. He has never used smokeless tobacco. He reports that he drinks alcohol. He reports that he does not  use drugs.  Allergies:  Allergies  Allergen Reactions  . Xarelto [Rivaroxaban] Hives   Medications Prior to Admission  Medication Sig Dispense Refill  . aspirin EC 81 MG tablet Take 81 mg by mouth once.    . Cholecalciferol (VITAMIN D-3) 1000 UNITS CAPS Take 1,000 Units by mouth daily.     Marland Kitchen FLUZONE HIGH-DOSE 0.5 ML SUSY Inject 1 application into the muscle once.  0  . furosemide (LASIX) 20 MG tablet Take 20 mg by mouth daily.  6  . loratadine (CLARITIN) 10 MG tablet Take 10 mg by mouth daily.    . naproxen sodium (ANAPROX) 220 MG tablet Take 440 mg by mouth at bedtime as needed (TAKES MOST NIGHTS).       Home: Home Living Family/patient expects to be discharged to:: Inpatient rehab Living Arrangements: Spouse/significant other Available Help at Discharge: Family, Personal care attendant, Available 24 hours/day Type of Home: House Home Access: Level entry Home Layout: One level Bathroom Shower/Tub: Tub/shower unit, Walk-in shower (pt uses tub) Bathroom Toilet: Standard Home Equipment: Environmental consultant - 4 wheels, Grab bars - tub/shower  Lives With: Spouse   Functional History: Prior Function Level of Independence: Independent with assistive device(s) Comments: rollator for all mobility. Independent with BADL  Functional Status:  Mobility: Bed Mobility General bed mobility comments: OOB in recliner--pt sleeps in recliner Transfers Overall transfer level: Needs assistance Equipment used: Rolling walker (2 wheeled) Transfers: Sit to/from Stand Sit to Stand: Max assist, +2 physical assistance General transfer comment: He used extension to scoot forward to [place] with increased time to then  lean forward mainly using RUE on arm of recliner. Pt then used both hands on bed rail in front of him to pull up to standing with minguard A +2. Did this due to pt with decreased coordination pushing up from recliner very well and difficult transition from hands on chair arms up to bed rail due to ataxic  LUE and tendency to lean right.-- per prior notes pt max A +2 sit to stand with pushing up. In standing pt with cues for upright posture and verbal and tactile cues for left<>right weight shifts. The longer he stood the more he leaned right and with "sinking" posture and LUE sliding down bed rail Ambulation/Gait General Gait Details: unable to ambulate, pre-gait only    ADL: ADL Overall ADL's : Needs assistance/impaired Grooming: Minimal assistance, Sitting Upper Body Bathing: Moderate assistance, Sitting Lower Body Bathing: +2 for physical assistance, Sit to/from stand, Maximal assistance Upper Body Dressing : Moderate assistance, Sitting Upper Body Dressing Details (indicate cue type and reason): to don hospital gown Lower Body Dressing: Maximal assistance, +2 for physical assistance, Sit to/from stand Functional mobility during ADLs: Maximal assistance, +2 for physical assistance (for sit to stand only) General ADL Comments: Pt required consistent verbal cues for upright posture in standing and to facilitate weight shift to R side. Educated pt and family on protection of LUE and keeping it on pillow for safety.  Cognition: Cognition Overall Cognitive Status: Within Functional Limits for tasks assessed Arousal/Alertness: Awake/alert Orientation Level: Oriented X4 Attention: Sustained Sustained Attention: Appears intact Memory: Appears intact Awareness: Appears intact Problem Solving: Appears intact Safety/Judgment: Appears intact Cognition Arousal/Alertness: Awake/alert Behavior During Therapy: WFL for tasks assessed/performed Overall Cognitive Status: Within Functional Limits for tasks assessed  Physical Exam: Blood pressure 137/70, pulse (!) 57, temperature 98.1 F (36.7 C), temperature source Oral, resp. rate 20, height 5' 6"  (1.676 m), weight 84.5 kg (186 lb 4.6 oz), SpO2 97 %. Physical Exam Constitutional: He is oriented to person, place, and time. He appears  well-developed. NAD. HENT:  Head: Normocephalic. Atraumatic. Eyes: Pupils are equal, round, and reactive to light. EOMI. Neck: Neck supple. Normal ROM. Cardiovascular: Normal rate. No JVD.  Respiratory: No respiratory distress. He has no wheezes.  GI: He exhibits no distension. There is no tenderness.  Musculoskeletal: Normal range of motion. He exhibits no edema.  Neurological: He is alert and oriented to person, place, and time.  LUE: shoulder abduction 2/5, elbow flexion/extesnion 3+/5, hang grip 3+/5.  Ataxia LLE: 2/5 proximal to distal RUE: 4+/5 proxmimal to distal RLE: 4/5 proximal to distal DTRs 3+ LUE/LLE Sensation intact to light touch Skin: Skin is warm and dry. Scattered echymoses.  Left elbow open wound with slough. Psychiatric: He has a normal mood and affect. His behavior is normal. Thought content normal.    Results for orders placed or performed during the hospital encounter of 07/28/16 (from the past 48 hour(s))  CBC     Status: None   Collection Time: 08/01/16  3:06 AM  Result Value Ref Range   WBC 8.5 4.0 - 10.5 K/uL   RBC 4.71 4.22 - 5.81 MIL/uL   Hemoglobin 14.9 13.0 - 17.0 g/dL   HCT 44.2 39.0 - 52.0 %   MCV 93.8 78.0 - 100.0 fL   MCH 31.6 26.0 - 34.0 pg   MCHC 33.7 30.0 - 36.0 g/dL   RDW 14.5 11.5 - 15.5 %   Platelets 178 150 - 400 K/uL  Basic metabolic panel     Status:  Abnormal   Collection Time: 08/01/16  3:06 AM  Result Value Ref Range   Sodium 140 135 - 145 mmol/L   Potassium 4.0 3.5 - 5.1 mmol/L   Chloride 108 101 - 111 mmol/L   CO2 25 22 - 32 mmol/L   Glucose, Bld 125 (H) 65 - 99 mg/dL   BUN 25 (H) 6 - 20 mg/dL   Creatinine, Ser 1.12 0.61 - 1.24 mg/dL   Calcium 9.6 8.9 - 10.3 mg/dL   GFR calc non Af Amer 54 (L) >60 mL/min   GFR calc Af Amer >60 >60 mL/min    Comment: (NOTE) The eGFR has been calculated using the CKD EPI equation. This calculation has not been validated in all clinical situations. eGFR's persistently <60 mL/min signify  possible Chronic Kidney Disease.    Anion gap 7 5 - 15   No results found.     Medical Problem List and Plan: 1.  Weakness, gait instability secondary to right internal capsule/thalamic hemorrhage. 2.  DVT Prophylaxis/Anticoagulation: Pharmaceutical: Lovenox 3. Pain Management: PRN tylenol 4. Mood: LCSW to follow for evaluation and support.  5. Neuropsych: This patient is capable of making decisions on his own behalf. 6. Skin/Wound Care: Routine pressure relief measures.   Will consider santly to left elbow wound.  7. Fluids/Electrolytes/Nutrition: Encourage fluid intake and offer supplements between meals. Monitor I/O. Check lytes in am.  8. WIO:MBTDHRC:-BULAGTXM ON Norvasc, Lisinopril and Lasix daily. SBP goal<160.  9. IWO:EHOZYYQ HR bid--has been controlled.  10. Pre-renal azotemia: Encourage po fluids. Add supplements between meals. Recheck lytes in am.  11. Chronic insomnia: sleeps in a recliner at nights.  12. Hyperlipidemia: Pravastatin added this admission.   Post Admission Physician Evaluation: 1. Functional deficits secondary  to right internal capsule/thalamic hemorrhage. 2. Patient is admitted to receive collaborative, interdisciplinary care between the physiatrist, rehab nursing staff, and therapy team. 3. Patient's level of medical complexity and substantial therapy needs in context of that medical necessity cannot be provided at a lesser intensity of care such as a SNF. 4. Patient has experienced substantial functional loss from his/her baseline which was documented above under the "Functional History" and "Functional Status" headings.  Judging by the patient's diagnosis, physical exam, and functional history, the patient has potential for functional progress which will result in measurable gains while on inpatient rehab.  These gains will be of substantial and practical use upon discharge  in facilitating mobility and self-care at the household level. 5. Physiatrist will  provide 24 hour management of medical needs as well as oversight of the therapy plan/treatment and provide guidance as appropriate regarding the interaction of the two. 6. 24 hour rehab nursing will assist with bladder management, safety, disease management and patient education  and help integrate therapy concepts, techniques,education, etc. 7. PT will assess and treat for/with: Lower extremity strength, range of motion, stamina, balance, functional mobility, safety, adaptive techniques and equipment, woundcare, coping skills, pain control, stroke education.   Goals are: Min A/Mod A. 8. OT will assess and treat for/with: ADL's, functional mobility, safety, upper extremity strength, adaptive techniques and equipment, wound mgt, ego support, and community reintegration.   Goals are: Min/Mod A. Therapy may proceed with showering this patient. 9. Case Management and Social Worker will assess and treat for psychological issues and discharge planning. 10. Team conference will be held weekly to assess progress toward goals and to determine barriers to discharge. 11. Patient will receive at least 3 hours of therapy per day at least 5  days per week. 12. ELOS: 18-22 day.         13. Prognosis:  good  Delice Lesch, MD, Mellody Drown 08/02/2016

## 2016-08-02 NOTE — Progress Notes (Signed)
Received pt. As a transfer.Pt. And his wife were oriented to the unit protocol.Safety plan was explained.Fall prevention plan was explained and signed.Pt.'s sacrum area is red and non blanchable.Keep monitoring closely.

## 2016-08-02 NOTE — Progress Notes (Signed)
Meredith Staggers, MD Physician Signed Physical Medicine and Rehabilitation  Consult Note Date of Service: 07/30/2016 4:22 PM  Related encounter: ED to Hosp-Admission (Discharged) from 07/28/2016 in [place] All Collapse All   [] Hide copied text [] Hover for attribution information      Physical Medicine and Rehabilitation Consult  Reason for Consult:left sided weakness                     Referring Physician: Erlinda Hong   HPI: Thomas Burnett is a 82 y.o. male with history of HTN, prostate cancer who was admitted on 07/28/16 with left sided weakness and elevated BP.  CT head done revealing right internal capsule and thalamic hemorrhage. He was started on cleviprex for BP goal < 160. IV labetalol and    Review of Systems  Constitutional: Negative for fever.  Eyes: Positive for double vision.  Respiratory: Negative for cough.   Cardiovascular: Negative for chest pain.  Gastrointestinal: Negative for heartburn.  Genitourinary: Negative for dysuria.  Musculoskeletal: Negative for myalgias.  Skin: Negative for rash.  Neurological: Positive for focal weakness and headaches.  Psychiatric/Behavioral: Negative for depression.       Past Medical History:  Diagnosis Date  . Hypertension   . Mild aortic insufficiency   . Mild aortic sclerosis ([place])   . Obesity   . Osteopenia   . Prostate cancer ([place])    STAGE 2  . Right bundle branch block (RBBB)   . Vitamin D deficiency    History reviewed. No pertinent surgical history. History reviewed. No pertinent family history. Social History:  reports that he has quit smoking. He has never used smokeless tobacco. He reports that he drinks alcohol. He reports that he does not use drugs. Allergies:      Allergies  Allergen Reactions  . Xarelto [Rivaroxaban] Hives         Medications Prior to Admission  Medication Sig Dispense Refill  . aspirin EC 81 MG tablet  Take 81 mg by mouth once.    . Cholecalciferol (VITAMIN D-3) 1000 UNITS CAPS Take 1,000 Units by mouth daily.     Marland Kitchen FLUZONE HIGH-DOSE 0.5 ML SUSY Inject 1 application into the muscle once.  0  . furosemide (LASIX) 20 MG tablet Take 20 mg by mouth daily.  6  . loratadine (CLARITIN) 10 MG tablet Take 10 mg by mouth daily.    . naproxen sodium (ANAPROX) 220 MG tablet Take 440 mg by mouth at bedtime as needed (TAKES MOST NIGHTS).       Home: Home Living Family/patient expects to be discharged to:: Inpatient rehab Living Arrangements: Spouse/significant other Available Help at Discharge: Family, Personal care attendant, Available 24 hours/day Type of Home: House Home Access: Level entry Home Layout: One level Bathroom Shower/Tub: Tub/shower unit, Walk-in shower (pt uses tub) Bathroom Toilet: Standard Home Equipment: Environmental consultant - 4 wheels, Grab bars - tub/shower  Lives With: Spouse  Functional History: Prior Function Level of Independence: Independent with assistive device(s) Comments: rollator for all mobility. Independent with BADL Functional Status:  Mobility: Bed Mobility General bed mobility comments: Pt OOB in chair upon arrival Transfers Overall transfer level: Needs assistance Equipment used: Rolling walker (2 wheeled) Transfers: Sit to/from Stand Sit to Stand: Max assist, +2 physical assistance General transfer comment: Max assist +2 to boost up from chair. Cues for hand placement and facilitation of LUE to hold onto RW during transfer. Ambulation/Gait General  Gait Details: unable to ambulate, pre-gait only    ADL: ADL Overall ADL's : Needs assistance/impaired Grooming: Minimal assistance, Sitting Upper Body Bathing: Moderate assistance, Sitting Lower Body Bathing: +2 for physical assistance, Sit to/from stand, Maximal assistance Upper Body Dressing : Moderate assistance, Sitting Upper Body Dressing Details (indicate cue type and reason): to don hospital  gown Lower Body Dressing: Maximal assistance, +2 for physical assistance, Sit to/from stand Functional mobility during ADLs: Maximal assistance, +2 for physical assistance (for sit to stand only) General ADL Comments: Pt required consistent verbal cues for upright posture in standing and to facilitate weight shift to R side. Educated pt and family on protection of LUE and keeping it on pillow for safety.  Cognition: Cognition Overall Cognitive Status: Within Functional Limits for tasks assessed Arousal/Alertness: Awake/alert Orientation Level: Oriented X4 Attention: Sustained Sustained Attention: Appears intact Memory: Appears intact Awareness: Appears intact Problem Solving: Appears intact Safety/Judgment: Appears intact Cognition Arousal/Alertness: Awake/alert Behavior During Therapy: Impulsive Overall Cognitive Status: Within Functional Limits for tasks assessed  Blood pressure (!) 148/135, pulse 97, temperature 98.1 F (36.7 C), temperature source Oral, resp. rate 20, height 5\' 6"  (1.676 m), weight 84.5 kg (186 lb 4.6 oz), SpO2 94 %. Physical Exam  Constitutional: He is oriented to person, place, and time. He appears well-developed.  HENT:  Head: Normocephalic.  Eyes: Pupils are equal, round, and reactive to light.  Neck: Neck supple.  Cardiovascular: Normal rate and intact distal pulses.   Respiratory: No respiratory distress. He has no wheezes.  GI: He exhibits no distension. There is no tenderness.  Musculoskeletal: Normal range of motion. He exhibits deformity. He exhibits no edema.  Neurological: He is alert and oriented to person, place, and time.  Limb ataxia LUE and LLE, decreased FMC. LUE motor 4/5 prox to distal. LLE: 2/5 hf,ke and 3/5 adf/pf  Skin: Skin is warm and dry.  Scattered echymoses  Psychiatric: He has a normal mood and affect. His behavior is normal. Thought content normal.    Lab Results Last 24 Hours       Results for orders placed or performed  during the hospital encounter of 07/28/16 (from the past 24 hour(s))  Basic metabolic panel     Status: Abnormal   Collection Time: 07/30/16 12:01 AM  Result Value Ref Range   Sodium 139 135 - 145 mmol/L   Potassium 3.6 3.5 - 5.1 mmol/L   Chloride 108 101 - 111 mmol/L   CO2 25 22 - 32 mmol/L   Glucose, Bld 149 (H) 65 - 99 mg/dL   BUN 20 6 - 20 mg/dL   Creatinine, Ser 1.02 0.61 - 1.24 mg/dL   Calcium 9.4 8.9 - 10.3 mg/dL   GFR calc non Af Amer >60 >60 mL/min   GFR calc Af Amer >60 >60 mL/min   Anion gap 6 5 - 15  Magnesium     Status: None   Collection Time: 07/30/16 12:01 AM  Result Value Ref Range   Magnesium 1.8 1.7 - 2.4 mg/dL  CBC     Status: None   Collection Time: 07/30/16  5:06 AM  Result Value Ref Range   WBC 9.0 4.0 - 10.5 K/uL   RBC 5.19 4.22 - 5.81 MIL/uL   Hemoglobin 16.3 13.0 - 17.0 g/dL   HCT 48.3 39.0 - 52.0 %   MCV 93.1 78.0 - 100.0 fL   MCH 31.4 26.0 - 34.0 pg   MCHC 33.7 30.0 - 36.0 g/dL   RDW 14.1 11.5 -  15.5 %   Platelets 180 150 - 400 K/uL  Basic metabolic panel     Status: Abnormal   Collection Time: 07/30/16  5:06 AM  Result Value Ref Range   Sodium 140 135 - 145 mmol/L   Potassium 3.8 3.5 - 5.1 mmol/L   Chloride 105 101 - 111 mmol/L   CO2 27 22 - 32 mmol/L   Glucose, Bld 135 (H) 65 - 99 mg/dL   BUN 20 6 - 20 mg/dL   Creatinine, Ser 1.07 0.61 - 1.24 mg/dL   Calcium 9.8 8.9 - 10.3 mg/dL   GFR calc non Af Amer 57 (L) >60 mL/min   GFR calc Af Amer >60 >60 mL/min   Anion gap 8 5 - 15  Triglycerides     Status: None   Collection Time: 07/30/16  5:07 AM  Result Value Ref Range   Triglycerides 85 <150 mg/dL      Imaging Results (Last 48 hours)  Ct Head Wo Contrast  Result Date: 07/29/2016 CLINICAL DATA:  Followup intracranial hemorrhage. Left-sided weakness EXAM: CT HEAD WITHOUT CONTRAST TECHNIQUE: Contiguous axial images were obtained from the base of the skull through the vertex without intravenous  contrast. COMPARISON:  Yesterday FINDINGS: Brain: Unchanged ovoid hematoma in the right internal capsule and lateral thalamus measuring 25 mm in length and 15 mm in thickness. No new site of hemorrhage. No acute infarct. Atrophy that is moderate and fairly generalized. Chronic small vessel ischemic change in the periventricular white matter. Vascular: Atherosclerotic calcification Skull: Negative for fracture. Sinuses/Orbits: Negative IMPRESSION: Unchanged right internal capsule and thalamic hematoma. No significant mass effect. No new abnormality. Electronically Signed   By: Monte Fantasia M.D.   On: 07/29/2016 16:28   Ct Cervical Spine Wo Contrast  Result Date: 07/28/2016 CLINICAL DATA:  82 year old male with hemorrhagic stroke and fall. EXAM: CT CERVICAL SPINE WITHOUT CONTRAST TECHNIQUE: Multidetector CT imaging of the cervical spine was performed without intravenous contrast. Multiplanar CT image reconstructions were also generated. COMPARISON:  Head CT dated 07/28/2016 FINDINGS: Alignment: No acute subluxation. Skull base and vertebrae: No acute fracture. The bones are osteopenic which limits evaluation for fracture. There is ankylosis of the C2 and C3 vertebra. Soft tissues and spinal canal: No prevertebral fluid or swelling. No visible canal hematoma. Disc levels: There multilevel disc disease with vacuum phenomena. There is grade 1 C5-C6 anterolisthesis and grade 1 T1-T2 anterolisthesis. Degenerative this disease most prominent at C6-C7 where there is disc space narrowing and endplate irregularity and osteophyte formation. There is multilevel facet hypertrophy most prominent at C4-C5, and C5-C6 with associated neural foramina narrowing. Upper chest: There is morphologic changes of tracheomalacia. The visualized lung apices are clear. Other: None IMPRESSION: No acute/traumatic cervical spine pathology. Multilevel degenerative changes. Electronically Signed   By: Anner Crete M.D.   On: 07/28/2016  23:28   Dg Chest Portable 1 View  Result Date: 07/28/2016 CLINICAL DATA:  Recent fall EXAM: PORTABLE CHEST 1 VIEW COMPARISON:  None. FINDINGS: Cardiac shadow is mildly enlarged. Aortic calcifications are seen. No focal infiltrate or sizable effusion is noted. No acute bony abnormality is seen. No pneumothorax is noted. IMPRESSION: No acute abnormality noted. Electronically Signed   By: Inez Catalina M.D.   On: 07/28/2016 21:03   Ct Head Code Stroke Wo Contrast  Result Date: 07/28/2016 CLINICAL DATA:  Code stroke. Initial evaluation for acute left-sided weakness, slurred speech. EXAM: CT HEAD WITHOUT CONTRAST TECHNIQUE: Contiguous axial images were obtained from the base of the skull  through the vertex without intravenous contrast. COMPARISON:  None available. FINDINGS: Brain: Diffuse prominence of the CSF containing spaces compatible with generalized cerebral atrophy. Moderate chronic microvascular ischemic disease. There is an acute intraparenchymal hemorrhage centered at the right thalamus that measures 13 x 15 x 25 mm (estimated volume 2.4 cc). Mild localized edema without significant mass effect. No intraventricular extension into the adjacent right lateral ventricle. A hypertensive etiology is suspected. The no other acute intracranial hemorrhage. No evidence for acute large vessel territory infarct. No mass lesion, midline shift or mass effect. Ventricular prominence related to global parenchymal volume loss of hydrocephalus. No extra-axial fluid collection. Vascular: No hyperdense vessel. Prominent vascular calcifications noted within the carotid siphons and distal left vertebral artery. Skull: Scalp soft tissues within normal limits.  Calvarium intact. Sinuses/Orbits: The globes and orbital soft tissues within normal limits. Senescent ocular calcifications noted. Paranasal sinuses are clear. Small bilateral mastoid effusions noted, of doubtful clinical significance. IMPRESSION: 1. 13 x 15 x 25  mm acute right thalamic parenchymal hemorrhage, estimated volume 2.4 cc. Mild localized edema without significant mass effect. No intraventricular extension. Hypertensive etiology is suspected. 2. Generalized age-related cerebral atrophy with chronic microvascular ischemic disease. The covering stroke neurologist has been paged at the time of this dictation (approximately 8:50 p.m. on 07/28/2016). Currently awaiting a call back. Findings will be conveyed as soon as possible. Electronically Signed   By: Jeannine Boga M.D.   On: 07/28/2016 20:55     Assessment/Plan: Diagnosis: Right internal capsule/thalamic hemorrhage 1. Does the need for close, 24 hr/day medical supervision in concert with the patient's rehab needs make it unreasonable for this patient to be served in a less intensive setting? Yes 2. Co-Morbidities requiring supervision/potential complications: post hemorrhage sequelae 3. Due to bladder management, bowel management, safety, skin/wound care, disease management, medication administration, pain management and patient education, does the patient require 24 hr/day rehab nursing? Yes 4. Does the patient require coordinated care of a physician, rehab nurse, PT (1-2 hrs/day, 5 days/week), OT (1-2 hrs/day, 5 days/week) and SLP (1-2 hrs/day, 5 days/week) to address physical and functional deficits in the context of the above medical diagnosis(es)? Yes Addressing deficits in the following areas: balance, endurance, locomotion, strength, transferring, bowel/bladder control, bathing, dressing, feeding, grooming, toileting, speech and psychosocial support 5. Can the patient actively participate in an intensive therapy program of at least 3 hrs of therapy per day at least 5 days per week? Yes 6. The potential for patient to make measurable gains while on inpatient rehab is excellent 7. Anticipated functional outcomes upon discharge from inpatient rehab are supervision and min assist  with PT,  supervision and min assist with OT, modified independent with SLP. 8. Estimated rehab length of stay to reach the above functional goals is: 18-20 days 9. Does the patient have adequate social supports and living environment to accommodate these discharge functional goals? Yes 10. Anticipated D/C setting: Home 11. Anticipated post D/C treatments: HH therapy and Outpatient therapy 12. Overall Rehab/Functional Prognosis: excellent  RECOMMENDATIONS: This patient's condition is appropriate for continued rehabilitative care in the following setting: CIR Patient has agreed to participate in recommended program. Yes Note that insurance prior authorization may be required for reimbursement for recommended care.  Comment: Rehab Admissions Coordinator to follow up.  Thanks,  Meredith Staggers, MD, Mellody Drown     07/30/2016    Revision History

## 2016-08-02 NOTE — Progress Notes (Signed)
Angie, RN from [facility] rehab called, I gave her report. Getting ready to transfer pt in chair to his new room [id number].

## 2016-08-02 NOTE — Progress Notes (Signed)
Physical Therapy Treatment Patient Details Name: Thomas Burnett MRN: [id number] DOB: [birth year] Today's Date: 08/02/2016    History of Present Illness 82 y.o.malewith a history of hypertension presenting with left sided weakness. Upon EMS evaluation, pt with blood pressure in 220s. CT on 10/25 positive for acute right thalamic hemorrhage.    PT Comments    Patient somewhat fixated on left leg pain (both thigh and calf). Noted patient has been on Lovenox for several days (however could not find documentation of LE doppler to r/o DVT). Reports the pain feels like a cramp and at times the pain hits when he is not even moving his leg, but mostly occurs with use of leg. Continues to progress in standing tolerance and LLE strength. Close-guarding to left knee, however no buckling this date. Stood for up to 90 seconds.    Follow Up Recommendations  CIR     Equipment Recommendations   (TBA next venue)    Recommendations for Other Services       Precautions / Restrictions Precautions Precautions: Fall Precaution Comments: Skin tear on L elbow from fall Restrictions Weight Bearing Restrictions: No    Mobility  Bed Mobility               General bed mobility comments: OOB in recliner--pt sleeps in recliner  Transfers Overall transfer level: Needs assistance Equipment used:  (held lower bed rail) Transfers: Sit to/from Stand Sit to Stand: Max assist;+2 physical assistance         General transfer comment: He used extension to scoot forward to [place] with increased time to then lean forward mainly using RUE on arm of recliner. Pt then used both hands on bed rail in front of him to pull up to standing with min A +2 first time; minguard assist 2nd time. Did this due to pt with decreased coordination pushing up from recliner very well and difficult transition from hands on chair arms up to bed rail due to ataxic LUE and tendency to lean right.-- per prior notes pt max A +2 sit to  stand with pushing up. Patient required +2 max for stand to sit (LUE ataxic and unable to help slow his descent and patient "just flops" backwards.  Ambulation/Gait                 Stairs            Wheelchair Mobility    Modified Rankin (Stroke Patients Only) Modified Rankin (Stroke Patients Only) Pre-Morbid Rankin Score: Moderate disability Modified Rankin: Moderately severe disability     Balance Overall balance assessment: Needs assistance Sitting-balance support: Bilateral upper extremity supported;Feet supported Sitting balance-Leahy Scale: Poor Sitting balance - Comments: using Bil UEs on bed rail to help him maintain forward sitting posture in recliner Postural control: Posterior lean;Right lateral lean Standing balance support: Bilateral upper extremity supported Standing balance-Leahy Scale: Zero Standing balance comment: In standing pt with cues for upright posture and verbal and tactile cues for left<>right weight shifts. The longer he stood the more he leaned right and with "sinking" posture and LUE sliding down bed rail                    Cognition Arousal/Alertness: Awake/alert Behavior During Therapy: WFL for tasks assessed/performed Overall Cognitive Status: Within Functional Limits for tasks assessed                      Exercises General Exercises - Lower Extremity Ankle Circles/Pumps:  AROM;Left;5 reps Long Arc Quad: AROM;Left;10 reps Hip ABduction/ADduction: Strengthening;Both;5 reps;Seated (isometric) Hip Flexion/Marching: AAROM;Left;5 reps Heel Raises: AAROM;Left;5 reps;Seated Other Exercises Other Exercises: Had pt work on transferring one of his grooming item bottles from right <>left hand. 10 reps (5 and 5). Also had him do resistance palms against palms of our hands and then resistance with up and down movements (palms against palms). Pt continues with decreased coordination and strength of LUE    General Comments         Pertinent Vitals/Pain Pain Assessment: Faces Faces Pain Scale: Hurts even more Pain Location: LLE--thigh with standing, calf when seated (reports had this pain PTA but is worse now) Pain Descriptors / Indicators: Cramping Pain Intervention(s): Limited activity within patient's tolerance;Monitored during session;Repositioned    Home Living                      Prior Function            PT Goals (current goals can now be found in the care plan section) Acute Rehab PT Goals Patient Stated Goal: get back to being independent Time For Goal Achievement: 08/13/16 Progress towards PT goals: Progressing toward goals    Frequency    Min 4X/week      PT Plan Current plan remains appropriate    Co-evaluation PT/OT/SLP Co-Evaluation/Treatment: Yes Reason for Co-Treatment: Complexity of the patient's impairments (multi-system involvement);For patient/therapist safety PT goals addressed during session: Mobility/safety with mobility;Balance;Strengthening/ROM OT goals addressed during session: Strengthening/ROM     End of Session Equipment Utilized During Treatment: Gait belt Activity Tolerance: Patient limited by fatigue (seated rest between standing ) Patient left: in chair;with call bell/phone within reach;with chair alarm set;with family/visitor present     Time: [id number] PT Time Calculation (min) (ACUTE ONLY): 31 min  Charges:  $Therapeutic Activity: 8-22 mins                    G Codes:      Sigifredo Pignato 2016/08/14, 12:20 PM Pager [phone]

## 2016-08-02 NOTE — Care Management Note (Signed)
Case Management Note  Patient Details  Name: Thomas Burnett MRN: [id number] Date of Birth: Sep 28, 1920  Subjective/Objective:                    Action/Plan: Pt discharging to CIR today. No further needs per CM.   Expected Discharge Date:                  Expected Discharge Plan:  [place]  In-House Referral:     Discharge planning Services  CM Consult  Post Acute Care Choice:    Choice offered to:     DME Arranged:    DME Agency:     HH Arranged:    [place] Agency:     Status of Service:  Completed, signed off  If discussed at [employer] of Stay Meetings, dates discussed:    Additional Comments:  Pollie Friar, RN 08/02/2016, 12:09 PM

## 2016-08-03 ENCOUNTER — Inpatient Hospital Stay (HOSPITAL_COMMUNITY): Payer: Medicare Other | Admitting: Occupational Therapy

## 2016-08-03 ENCOUNTER — Inpatient Hospital Stay (HOSPITAL_COMMUNITY): Payer: Medicare Other | Admitting: Speech Pathology

## 2016-08-03 ENCOUNTER — Inpatient Hospital Stay (HOSPITAL_COMMUNITY): Payer: Medicare Other

## 2016-08-03 LAB — CBC WITH DIFFERENTIAL/PLATELET
Basophils Absolute: 0 10*3/uL (ref 0.0–0.1)
Basophils Relative: 0 %
EOS ABS: 0.1 10*3/uL (ref 0.0–0.7)
Eosinophils Relative: 2 %
HEMATOCRIT: 42.2 % (ref 39.0–52.0)
HEMOGLOBIN: 13.9 g/dL (ref 13.0–17.0)
LYMPHS ABS: 1.3 10*3/uL (ref 0.7–4.0)
LYMPHS PCT: 19 %
MCH: 31.3 pg (ref 26.0–34.0)
MCHC: 32.9 g/dL (ref 30.0–36.0)
MCV: 95 fL (ref 78.0–100.0)
MONOS PCT: 8 %
Monocytes Absolute: 0.6 10*3/uL (ref 0.1–1.0)
NEUTROS PCT: 71 %
Neutro Abs: 5.1 10*3/uL (ref 1.7–7.7)
Platelets: 167 10*3/uL (ref 150–400)
RBC: 4.44 MIL/uL (ref 4.22–5.81)
RDW: 14.4 % (ref 11.5–15.5)
WBC: 7.2 10*3/uL (ref 4.0–10.5)

## 2016-08-03 LAB — COMPREHENSIVE METABOLIC PANEL
ALK PHOS: 59 U/L (ref 38–126)
ALT: 53 U/L (ref 17–63)
ANION GAP: 9 (ref 5–15)
AST: 48 U/L — ABNORMAL HIGH (ref 15–41)
Albumin: 3.3 g/dL — ABNORMAL LOW (ref 3.5–5.0)
BILIRUBIN TOTAL: 1.4 mg/dL — AB (ref 0.3–1.2)
BUN: 30 mg/dL — ABNORMAL HIGH (ref 6–20)
CALCIUM: 9.3 mg/dL (ref 8.9–10.3)
CO2: 23 mmol/L (ref 22–32)
CREATININE: 1.2 mg/dL (ref 0.61–1.24)
Chloride: 109 mmol/L (ref 101–111)
GFR, EST AFRICAN AMERICAN: 58 mL/min — AB (ref >60)
GFR, EST NON AFRICAN AMERICAN: 50 mL/min — AB (ref >60)
Glucose, Bld: 122 mg/dL — ABNORMAL HIGH (ref 65–99)
Potassium: 3.9 mmol/L (ref 3.5–5.1)
SODIUM: 141 mmol/L (ref 135–145)
TOTAL PROTEIN: 5.8 g/dL — AB (ref 6.5–8.1)

## 2016-08-03 NOTE — Evaluation (Signed)
Speech Language Pathology Assessment and Plan  Patient Details  Name: Thomas Burnett MRN: [id number] Date of Birth: 17-Nov-1919  SLP Diagnosis:   n/a    Today's Date: 08/03/2016 SLP Individual Time: 2119-4174 SLP Individual Time Calculation (min): 60 min    Problem List: Patient Active Problem List   Diagnosis Date Noted  . Hypertensive emergency 08/02/2016  . Chronic a-fib ([place]) 08/02/2016  . Hyperlipidemia 08/02/2016  . Obesity 08/02/2016  . Prostate cancer ([place]) 08/02/2016  . Right bundle branch block 08/02/2016  . Nontraumatic thalamic hemorrhage ([place]) 08/02/2016  . Gait disturbance, post-stroke   . Left hemiparesis ([place])   . Open wound   . Benign essential HTN   . Chronic atrial fibrillation ([place])   . Prerenal azotemia   . Primary insomnia   . Mixed hyperlipidemia   . ICH (intracerebral hemorrhage) ([place]) - small R thalamic d/t HTN 07/28/2016   Past Medical History:  Past Medical History:  Diagnosis Date  . Hypertension   . Mild aortic insufficiency   . Mild aortic sclerosis ([place])   . Obesity   . Osteopenia   . Prostate cancer ([place])    STAGE 2  . Right bundle branch block (RBBB)   . Vitamin D deficiency    Past Surgical History: No past surgical history on file.  Assessment / Plan / Recommendation Clinical Impression Thomas Burnett is a 82 year old male with history fo HTN, prostate cancer, mild AS/AI, CAF--no Xarelto due to allergy; who was admitted on 07/28/16 with left sided weakness and mild left facial weakness with mild dysarthria.  BP elevated in ED and CT head done showing acute right thalamic hemorrhage with mild localized edema. Dr. Erlinda Hong felt that hemorrhage was due to hypertensive emergency and to consider eliquis in the future once ICH resolves. 2D echo with EF 55-60% with grade 1 diastolic dysfunction and moderate aortic stenosis. Carotid dopplers without significant stenosis.  Hydralazine added for better BP control but he developed itching  therefore this was changed to Norvasc. Patient with resultant left sided weakness affecting ability to carry out ADL tasks and mobility.  Pt was agreeable with SLP assessment, but declined to participate with a standardized assessment tool. Pt performed [facility] with discussion of safety, medication and financial management and recall of recent events. Pt did demonstrate occasional difficulty with word-finding, however this was independently managed with use of increased time and occasional use of descriptive language. Pt does not appear to require any further SLP services and denies any responsibilites at home which require high level cognitive-linguistic function. Pt is in agreement with evaluation only.     SLP Assessment  Patient does not need any further Speech Lanaguage Pathology Services    Recommendations  Follow up Recommendations: None                     Pain Pain Assessment Pain Assessment: No/denies pain  Prior Functioning Cognitive/Linguistic Baseline: Within functional limits Type of Home: House  Lives With: Spouse Available Help at Discharge: Family;Personal care attendant;Available 24 hours/day Vocation: Retired  Function:  Eating Eating                 Cognition Comprehension Comprehension assist level: Follows complex conversation/direction with extra time/assistive device  Expression   Expression assist level: Expresses complex ideas: With extra time/assistive device  Social Interaction Social Interaction assist level: Interacts appropriately 90% of the time - Needs monitoring or encouragement for participation or interaction.  Problem Solving Problem solving  assist level: Solves complex 90% of the time/cues < 10% of the time  Memory Memory assist level: Recognizes or recalls 90% of the time/requires cueing < 10% of the time    Refer to Care Plan for Long Term Goals  Recommendations for other services: None  Discharge Criteria: Patient will be  discharged from SLP if patient refuses treatment 3 consecutive times without medical reason, if treatment goals not met, if there is a change in medical status, if patient makes no progress towards goals or if patient is discharged from hospital.  The above assessment, treatment plan, treatment alternatives and goals were discussed and mutually agreed upon: by patient  Vinetta Bergamo  MA, [place] 08/03/2016, 12:45 PM

## 2016-08-03 NOTE — Plan of Care (Signed)
Problem: RH PAIN MANAGEMENT Goal: RH STG PAIN MANAGED AT OR BELOW PT'S PAIN GOAL Less than 3,on 1 to 10 scale.  Outcome: Not Progressing Reports headache pain as 5

## 2016-08-03 NOTE — Evaluation (Signed)
Occupational Therapy Assessment and Plan  Patient Details  Name: Thomas Burnett MRN: [id number] Date of Birth: 10/26/1919  OT Diagnosis: abnormal posture, acute pain, ataxia, hemiplegia affecting non-dominant side and muscle weakness (generalized) Rehab Potential: Rehab Potential (ACUTE ONLY): Good ELOS: 18-22 days   Today's Date: 08/03/2016 OT Individual Time: 5638-9373 OT Individual Time Calculation (min): 70 min      Problem List: Patient Active Problem List   Diagnosis Date Noted  . Hypertensive emergency 08/02/2016  . Chronic a-fib ([place]) 08/02/2016  . Hyperlipidemia 08/02/2016  . Obesity 08/02/2016  . Prostate cancer ([place]) 08/02/2016  . Right bundle branch block 08/02/2016  . Nontraumatic thalamic hemorrhage ([place]) 08/02/2016  . Gait disturbance, post-stroke   . Left hemiparesis ([place])   . Open wound   . Benign essential HTN   . Chronic atrial fibrillation ([place])   . Prerenal azotemia   . Primary insomnia   . Mixed hyperlipidemia   . ICH (intracerebral hemorrhage) ([place]) - small R thalamic d/t HTN 07/28/2016    Past Medical History:  Past Medical History:  Diagnosis Date  . Hypertension   . Mild aortic insufficiency   . Mild aortic sclerosis ([place])   . Obesity   . Osteopenia   . Prostate cancer ([place])    STAGE 2  . Right bundle branch block (RBBB)   . Vitamin D deficiency    Past Surgical History: No past surgical history on file.  Assessment & Plan Clinical Impression: Patient is a 82 y.o. year old male with history of HTN, prostate cancer, mild AS/AI, CAF--no Xarelto due to allergy; who was admitted on 07/28/16 with left sided weakness and mild left facial weakness with mild dysarthria.  BP elevated in ED and CT head done showing acute right thalamic hemorrhage with mild localized edema. Dr. Erlinda Hong felt that hemorrhage was due to hypertensive emergency and to consider eliquis in the future once ICH resolves. 2D echo with EF 55-60% with grade 1 diastolic dysfunction  and moderate aortic stenosis. Carotid dopplers without significant stenosis.  Hydralazine added for better BP control but he developed itching therefore this was changed to Norvasc. Patient with resultant left sided weakness affecting ability to carry out ADL tasks and mobility. CIR recommended for follow up therapy. .  Patient transferred to CIR on 08/02/2016 .    Patient currently requires max-total +2 with basic self-care skills secondary to muscle weakness, decreased cardiorespiratoy endurance, ataxia and decreased coordination, decreased attention to left and decreased sitting balance, decreased standing balance, decreased postural control, hemiplegia and decreased balance strategies.  Prior to hospitalization, patient could complete BADLs with modified independent .  Patient will benefit from skilled intervention to decrease level of assist with basic self-care skills prior to discharge home with care partner.  Anticipate patient will require 24 hour supervision and minimal physical assistance and follow up home health.  OT - End of Session Activity Tolerance: Tolerates 30+ min activity with multiple rests Endurance Deficit: Yes Endurance Deficit Description: pt requires extra time and to move slowly due to LLE pain OT Assessment Rehab Potential (ACUTE ONLY): Good OT Patient demonstrates impairments in the following area(s): Balance;Endurance;Motor;Pain;Perception;Safety;Skin Integrity OT Basic ADL's Functional Problem(s): Eating;Grooming;Bathing;Dressing;Toileting OT Transfers Functional Problem(s): Toilet;Tub/Shower OT Additional Impairment(s): Fuctional Use of Upper Extremity OT Plan OT Intensity: Minimum of 1-2 x/day, 45 to 90 minutes OT Frequency: 5 out of 7 days OT Duration/Estimated Length of Stay: 18-22 days OT Treatment/Interventions: Balance/vestibular training;Discharge planning;Disease Lawyer;Functional mobility  training;Neuromuscular re-education;Pain management;Patient/family education;Psychosocial support;Self  Care/advanced ADL retraining;Skin care/wound managment;Therapeutic Activities;Therapeutic Exercise;UE/LE Strength taining/ROM;UE/LE Coordination activities;Wheelchair propulsion/positioning OT Self Feeding Anticipated Outcome(s): Supervision/setup OT Basic Self-Care Anticipated Outcome(s): Min assist OT Toileting Anticipated Outcome(s): Min assist OT Bathroom Transfers Anticipated Outcome(s): Min assist OT Recommendation Patient destination: Home Follow Up Recommendations: Home health OT;24 hour supervision/assistance Equipment Recommended: 3 in 1 bedside comode;Tub/shower bench   Skilled Therapeutic Intervention OT eval completed with discussion of rehab process, OT purpose, POC, goals, and ELOS.  ADL assessment completed at sink side with requiring frequent cues to complete task as pt is quite verbose.+2 for sit > stand for LB hygiene and pulling up pants, with therapist providing tactile cues and manual facilitation for anterior weight shift while 2nd person assisted with lifting, hygiene, and clothing management.  Pt incorporating LUE into self-care tasks, however noted ataxic movements requiring increased time for pt to complete purposeful movements.    OT Evaluation Precautions/Restrictions  Precautions Precautions: Fall Precaution Comments: Lt hemiparesis and ataxia Restrictions Weight Bearing Restrictions: No Pain Pain Assessment Pain Assessment: No/denies pain Home Living/Prior Functioning Home Living Family/patient expects to be discharged to:: Private residence Living Arrangements: Spouse/significant other Available Help at Discharge: Family, Personal care attendant, Available 24 hours/day (aide 3x/week) Type of Home: House Home Access: Stairs to enter [employer] of Steps: 3 Entrance Stairs-Rails: Left Home Layout: One level (one level with a  basement) Bathroom Shower/Tub: Tub/shower unit, Walk-in shower (pt typically uses tub/shower as it has grab bars.  However he has a shower seat and could transition to walk-in if needed) Bathroom Toilet: Handicapped height Bathroom Accessibility: Yes Additional Comments: aide does lifting, laundry, etc. does not have to assist pt with his adls  Lives With: Spouse IADL History Homemaking Responsibilities: No Prior Function Level of Independence: Independent with basic ADLs, Independent with gait, Independent with transfers, Requires assistive device for independence, Needs assistance with homemaking  Able to Take Stairs?: Yes Driving: No Vocation: Retired Comments: rollator for all mobility. Independent with BADL ADL  See Function Navigator Vision/Perception  Vision- History Baseline Vision/History: Wears glasses;Macular Degeneration Wears Glasses: Reading only Patient Visual Report: No change from baseline Vision- Assessment Additional Comments: needs further assessment.   Cognition Overall Cognitive Status: Within Functional Limits for tasks assessed Arousal/Alertness: Awake/alert Orientation Level: Person;Place;Situation Person: Oriented Place: Oriented Situation: Oriented Year: 2017 Month: October Day of Week: Incorrect (stated Monday) Memory: Appears intact Immediate Memory Recall: Sock;Blue;Bed Memory Recall: Sock;Blue;Bed Memory Recall Sock: Without Cue Memory Recall Blue: Without Cue Memory Recall Bed: With Cue Attention: Sustained Sustained Attention: Appears intact Awareness: Appears intact Safety/Judgment: Appears intact Sensation Sensation Light Touch: Appears Intact Proprioception: Impaired by gross assessment Coordination Gross Motor Movements are Fluid and Coordinated: No Fine Motor Movements are Fluid and Coordinated: No Coordination and Movement Description: ataxia LUE; need to assess LE further Finger Nose Finger Test: ataxia in LUE 9 Hole Peg  Test: did not complete due to ataxia Motor  Motor Motor: Abnormal postural alignment and control;Hemiplegia;Ataxia Mobility     Trunk/Postural Assessment  Cervical Assessment Cervical Assessment:  (forward head) Thoracic Assessment Thoracic Assessment:  (kyphotic) Lumbar Assessment Lumbar Assessment:  (posterior tilt; poor dissociation) Postural Control Postural Control: Deficits on evaluation Trunk Control: impaired - posterior lean Righting Reactions: delayed and inadequate Postural Limitations: kyphotic posture  Balance Balance Balance Assessed: Yes Static Sitting Balance Static Sitting - Level of Assistance: 4: Min assist Dynamic Sitting Balance Dynamic Sitting - Level of Assistance: 3: Mod assist Static Standing Balance Static Standing - Level of Assistance: 2: Max assist  Dynamic Standing Balance Dynamic Standing - Level of Assistance: 1: +2 Total assist Extremity/Trunk Assessment RUE Assessment RUE Assessment: Within Functional Limits LUE Assessment LUE Assessment: Exceptions to WFL (ataxia; ROM WFL, shoulder grossly 3/5, elbow 3+/5, loose gross grasp)   See Function Navigator for Current Functional Status.   Refer to Care Plan for Long Term Goals  Recommendations for other services: None  Discharge Criteria: Patient will be discharged from OT if patient refuses treatment 3 consecutive times without medical reason, if treatment goals not met, if there is a change in medical status, if patient makes no progress towards goals or if patient is discharged from hospital.  The above assessment, treatment plan, treatment alternatives and goals were discussed and mutually agreed upon: by patient  Simonne Come 08/03/2016, 12:10 PM

## 2016-08-03 NOTE — Plan of Care (Signed)
Problem: RH Bed Mobility Goal: LTG Patient will perform bed mobility with assist (PT) LTG: Patient will perform bed mobility with assistance, with/without cues (PT). Outcome: Not Applicable Date Met: 35/33/17 No bed mobility goal as patient has not slept in bed x 4 years and not a goal of patient's right now. Can add in if pt wants to pursue this.

## 2016-08-03 NOTE — Progress Notes (Signed)
Patient information reviewed and entered into eRehab system by Burnis Halling, RN, CRRN, PPS Coordinator.  Information including medical coding and functional independence measure will be reviewed and updated through discharge.     Per nursing patient was given "Data Collection Information Summary for Patients in Inpatient Rehabilitation Facilities with attached "Privacy Act Statement-Health Care Records" upon admission.  

## 2016-08-03 NOTE — Care Management Note (Signed)
Inpatient [facility] Individual Statement of Services  Patient Name:  Thomas Burnett  Date:  08/03/2016  Welcome to the [facility].  Our goal is to provide you with an individualized program based on your diagnosis and situation, designed to meet your specific needs.  With this comprehensive rehabilitation program, you will be expected to participate in at least 3 hours of rehabilitation therapies Monday-Friday, with modified therapy programming on the weekends.  Your rehabilitation program will include the following services:  Physical Therapy (PT), Occupational Therapy (OT), Speech Therapy (ST), 24 hour per day rehabilitation nursing, Case Management (Social Worker), Rehabilitation Medicine, Nutrition Services and Pharmacy Services  Weekly team conferences will be held on Wednesday to discuss your progress.  Your Social Worker will talk with you frequently to get your input and to update you on team discussions.  Team conferences with you and your family in attendance may also be held.  Expected length of stay: 18-21 days  Overall anticipated outcome: min-mod ( amb) level of assist  Depending on your progress and recovery, your program may change. Your Social Worker will coordinate services and will keep you informed of any changes. Your Social Worker's name and contact numbers are listed  below.  The following services may also be recommended but are not provided by the [facility]:  [place] will be made to provide these services after discharge if needed.  Arrangements include referral to agencies that provide these services.  Your insurance has been verified to be:  [place] Your primary doctor is:  Radio broadcast assistant  Pertinent information will be shared with your doctor and your insurance company.  Social Worker:  Ovidio Kin, [place]  or ([phone]  Information discussed with and copy given to patient by: [name], 08/03/2016, 10:45 AM

## 2016-08-03 NOTE — Progress Notes (Signed)
Social Work Assessment and Plan Social Work Assessment and Plan  Patient Details  Name: Thomas Burnett MRN: [id number] Date of Birth: 1920-07-01  Today's Date: 08/03/2016  Problem List:  Patient Active Problem List   Diagnosis Date Noted  . Hypertensive emergency 08/02/2016  . Chronic a-fib (Thomas Burnett) 08/02/2016  . Hyperlipidemia 08/02/2016  . Obesity 08/02/2016  . Prostate cancer ([place]) 08/02/2016  . Right bundle branch block 08/02/2016  . Nontraumatic thalamic hemorrhage ([place]) 08/02/2016  . Gait disturbance, post-stroke   . Left hemiparesis (Thomas Burnett)   . Open wound   . Benign essential HTN   . Chronic atrial fibrillation (Thomas Burnett)   . Prerenal azotemia   . Primary insomnia   . Mixed hyperlipidemia   . ICH (intracerebral hemorrhage) (Thomas Burnett) - small R thalamic d/t HTN 07/28/2016   Past Medical History:  Past Medical History:  Diagnosis Date  . Hypertension   . Mild aortic insufficiency   . Mild aortic sclerosis ([place])   . Obesity   . Osteopenia   . Prostate cancer (Thomas Burnett)    STAGE 2  . Right bundle branch block (RBBB)   . Vitamin D deficiency    Past Surgical History: No past surgical history on file. Social History:  reports that he has quit smoking. He has never used smokeless tobacco. He reports that he drinks alcohol. He reports that he does not use drugs.  Family / Support Systems Marital Status: Married How Long?: 33 years Patient Roles: Spouse, Parent Spouse/Significant Other: [name] [phone]  [phone] Children: Two children-one local and one in [place] Other Supports: Friends and aide from [employer] Anticipated Caregiver: Wife and aide Ability/Limitations of Caregiver: Wife has knee issue and can only provide supervision level-may need to hire more hours of assistance Caregiver Availability: Other (Comment) (Will need to come up with a plan if wife can only provide supervision level) Family Dynamics: Good family supports and they come and see him. His main  caregiver is his wife, but she has health issues with her knee and is limited to what she can do for pt. Will see if can hire more hours of care via aide he already has.  Social History Preferred language: English Religion:  Cultural Background: No issues Education: Secretary/administrator educated Read: Yes Write: Yes Employment Status: Retired Freight forwarder Issues: No issues Guardian/Conservator: none-according to MD pt is capable of making his own decisions while here.    Abuse/Neglect Physical Abuse: Denies Verbal Abuse: Denies Sexual Abuse: Denies Exploitation of patient/patient's resources: Denies Self-Neglect: Denies  Emotional Status Pt's affect, behavior adn adjustment status: Pt is motivated and glad to be here on the rehab unit. He is willing to do what he can to regain his independence and get back home.  His is aware of his wife's limitations and does not want to burden her. He passed his speech goals and now he reports two more therapies left. Recent Psychosocial Issues: other health issues had a aide for a few hours three days a week to assist him. He could not say specifically what she does for him Pyschiatric History: No history deferred depression screen due to pt appears to be coping appropriately with his stroke. Will continue to monitor him while here and have neuro-psych see if needed. Substance Abuse History: No issues  Patient / Family Perceptions, Expectations & Goals Pt/Family understanding of illness & functional limitations: Pt and wife talk with the MD's and feel their questions have been answered. They are aware of the treatment plan  and what pt needs to do while here. Both feel thier concerns and questions have been addressed. Premorbid pt/family roles/activities: Husband, Father, grandfather, retiree, home owner, church member, etc Anticipated changes in roles/activities/participation: resume Pt/family expectations/goals: Pt states: " I want to do for  myself like I always have."  Wife states: " I hope he does well here, I can't lift him."  [employer]: Other (Comment) ([employer] 3x week) Premorbid Home Care/DME Agencies: Other (Comment) (had in past) Transportation available at discharge: wife and aide  Resource referrals recommended: Support group (specify)  Discharge Planning Living Arrangements: Spouse/significant other Support Systems: Spouse/significant other, Children, Friends/neighbors, Immunologist, Home care staff Type of Residence: Private residence Insurance Resources: [employer], Multimedia programmer (specify) Nurse, mental health) Financial Resources: Social Security, Other (Comment) (pension) Financial Screen Referred: No Living Expenses: Own Money Management: Spouse, Patient Does the patient have any problems obtaining your medications?: No Home Management: wife does the cooking, but has hired person to do the Merchant navy officer Preliminary Plans: Hopefully return home with wife, but will need to come up with a plan, since team's goals are for physical assist upon discharge. Wife is only able to provide supervision level and aide is only there three days a week. Will come up with a safe discharge plna for pt and see how much he progresses while here. Social Work Anticipated Follow Up Needs: HH/OP, Support Group  Clinical Impression Very pleasant talkative gentleman who is willing to work hard and recover from this stroke. He feels his main issues are with his hand and leg and speech is not an issue. Wife has health issues and can only provide supervision level due to a bad knee. Pt probably will require physical assist when discharged and will need to come up with a safe discharge plan for both. Will await team's evaluations and work on a plan.  [name] 08/03/2016, 1:33 PM

## 2016-08-03 NOTE — Progress Notes (Signed)
82 year old male with history fo HTN, prostate cancer, mild AS/AI, CAF--no Xarelto due to allergy; who was admitted on 07/28/16 with left sided weakness and mild left facial weakness with mild dysarthria.  BP elevated in ED and CT head done showing acute right thalamic hemorrhage with mild localized edema. Dr. Erlinda Hong felt that hemorrhage was due to hypertensive emergency and to consider eliquis in the future once ICH resolves. 2D echo with EF 55-60% with grade 1 diastolic dysfunction and moderate aortic stenosis. Carotid dopplers without significant stenosis  Subjective/Complaints: C/o slow call bell response , no BM for a couple days ROS- neg CP, SOB, N/V/D Objective: Vital Signs: Blood pressure (!) 135/94, pulse (!) 53, temperature 97.6 F (36.4 C), temperature source Oral, resp. rate 17, SpO2 98 %. No results found. Results for orders placed or performed during the hospital encounter of 08/02/16 (from the past 72 hour(s))  Creatinine, serum     Status: Abnormal   Collection Time: 08/02/16  4:03 PM  Result Value Ref Range   Creatinine, Ser 1.33 (H) 0.61 - 1.24 mg/dL   GFR calc non Af Amer 44 (L) >60 mL/min   GFR calc Af Amer 51 (L) >60 mL/min    Comment: (NOTE) The eGFR has been calculated using the CKD EPI equation. This calculation has not been validated in all clinical situations. eGFR's persistently <60 mL/min signify possible Chronic Kidney Disease.   CBC with Differential/Platelet     Status: None   Collection Time: 08/03/16  5:04 AM  Result Value Ref Range   WBC 7.2 4.0 - 10.5 K/uL   RBC 4.44 4.22 - 5.81 MIL/uL   Hemoglobin 13.9 13.0 - 17.0 g/dL   HCT 42.2 39.0 - 52.0 %   MCV 95.0 78.0 - 100.0 fL   MCH 31.3 26.0 - 34.0 pg   MCHC 32.9 30.0 - 36.0 g/dL   RDW 14.4 11.5 - 15.5 %   Platelets 167 150 - 400 K/uL   Neutrophils Relative % 71 %   Neutro Abs 5.1 1.7 - 7.7 K/uL   Lymphocytes Relative 19 %   Lymphs Abs 1.3 0.7 - 4.0 K/uL   Monocytes Relative 8 %   Monocytes Absolute  0.6 0.1 - 1.0 K/uL   Eosinophils Relative 2 %   Eosinophils Absolute 0.1 0.0 - 0.7 K/uL   Basophils Relative 0 %   Basophils Absolute 0.0 0.0 - 0.1 K/uL  Comprehensive metabolic panel     Status: Abnormal   Collection Time: 08/03/16  5:04 AM  Result Value Ref Range   Sodium 141 135 - 145 mmol/L   Potassium 3.9 3.5 - 5.1 mmol/L   Chloride 109 101 - 111 mmol/L   CO2 23 22 - 32 mmol/L   Glucose, Bld 122 (H) 65 - 99 mg/dL   BUN 30 (H) 6 - 20 mg/dL   Creatinine, Ser 1.20 0.61 - 1.24 mg/dL   Calcium 9.3 8.9 - 10.3 mg/dL   Total Protein 5.8 (L) 6.5 - 8.1 g/dL   Albumin 3.3 (L) 3.5 - 5.0 g/dL   AST 48 (H) 15 - 41 U/L   ALT 53 17 - 63 U/L   Alkaline Phosphatase 59 38 - 126 U/L   Total Bilirubin 1.4 (H) 0.3 - 1.2 mg/dL   GFR calc non Af Amer 50 (L) >60 mL/min   GFR calc Af Amer 58 (L) >60 mL/min    Comment: (NOTE) The eGFR has been calculated using the CKD EPI equation. This calculation has not  been validated in all clinical situations. eGFR's persistently <60 mL/min signify possible Chronic Kidney Disease.    Anion gap 9 5 - 15      General: No acute distress Mood and affect are appropriate Heart: Regular rate and rhythm no rubs murmurs or extra sounds, - JVD Lungs: Clear to auscultation, breathing unlabored, no rales or wheezes Abdomen: Positive bowel sounds, soft nontender to palpation, nondistended Extremities: No clubbing, cyanosis, or edema Skin: No evidence of breakdown, no evidence of rash Neurologic: Cranial nerves II through XII intact, motor strength is 5/5 in R deltoid, bicep, tricep, grip, hip flexor, knee extensors, ankle dorsiflexor and plantar flexor, 4/5 on left  Sensory exam normal sensation to light touch  bilateral upper and lower extremities Cerebellar exam normal finger to nose to finger as well as heel to shin in bilateral upperextremities Musculoskeletal: Full range of motion in all 4 extremities. No joint swelling   Assessment/Plan: 1. Functional  deficits secondary to R IC/THalamic ICH which require 3+ hours per day of interdisciplinary therapy in a comprehensive inpatient rehab setting. Physiatrist is providing close team supervision and 24 hour management of active medical problems listed below. Physiatrist and rehab team continue to assess barriers to discharge/monitor patient progress toward functional and medical goals. FIM:             Function - Chair/bed transfer Chair/bed transfer method: Stand pivot Chair/bed transfer assist level: Maximal assist (Pt 25 - 49%/lift and lower) Chair/bed transfer assistive device: Bedrails, Armrests Chair/bed transfer details: Manual facilitation for weight shifting     Function - Comprehension Comprehension: Auditory Comprehension assist level: Follows complex conversation/direction with extra time/assistive device  Function - Expression Expression: Verbal Expression assist level: Expresses complex ideas: With extra time/assistive device  Function - Social Interaction Social Interaction assist level: Interacts appropriately with others - No medications needed.  Function - Problem Solving Problem solving assist level: Solves complex problems: With extra time  Function - Memory Memory assist level: Recognizes or recalls 90% of the time/requires cueing < 10% of the time Patient normally able to recall (first 3 days only): Current season, Staff names and faces, That he or she is in a hospital  Medical Problem List and Plan: 1.  Weakness, gait instability secondary to right internal capsule/thalamic hemorrhage. Left hemiataxia, rehab evals today 2.  DVT Prophylaxis/Anticoagulation: Pharmaceutical: Lovenox, eventual plan for Eliquis 3. Pain Management: PRN tylenol 4. Mood: LCSW to follow for evaluation and support.  5. Neuropsych: This patient is capable of making decisions on his own behalf. 6. Skin/Wound Care: Routine pressure relief measures.              Will consider  santly to left elbow wound.  7. Fluids/Electrolytes/Nutrition: Encourage fluid intake and offer supplements between meals. Monitor I/O. Check lytes in am.  8. XLK:GMWNUUV:-OZDGUYQI ON Norvasc, Lisinopril and Lasix daily. SBP goal<160.  Vitals:   08/03/16 0441 08/03/16 0759  BP: (!) 132/92 (!) 135/94  Pulse: (!) 53   Resp: 17   Temp: 97.6 F (36.4 C)     9. HKV:QQVZDGL HR bid--has been controlled.  10. Pre-renal azotemia: Encourage po fluids. Add supplements between meals. Recheck lytes in am.  11. Chronic insomnia: sleeps in a recliner at nights.  12. Hyperlipidemia: Pravastatin added this admission.    LOS (Days) 1 A FACE TO FACE EVALUATION WAS PERFORMED  Maizie Garno E 08/03/2016, 8:22 AM

## 2016-08-03 NOTE — Evaluation (Signed)
Physical Therapy Assessment and Plan  Patient Details  Name: Thomas Burnett MRN: [id number] Date of Birth: 01-16-1920  PT Diagnosis: Ataxia, Difficulty walking, Hemiparesis non-dominant, Muscle weakness and Pain in joint Rehab Potential: Good ELOS: 14-21 days   Today's Date: 08/03/2016 PT Individual Time: 0800-0900 PT Individual Time Calculation (min): 60 min     Problem List: Patient Active Problem List   Diagnosis Date Noted  . Hypertensive emergency 08/02/2016  . Chronic a-fib ([place]) 08/02/2016  . Hyperlipidemia 08/02/2016  . Obesity 08/02/2016  . Prostate cancer ([place]) 08/02/2016  . Right bundle branch block 08/02/2016  . Nontraumatic thalamic hemorrhage ([place]) 08/02/2016  . Gait disturbance, post-stroke   . Left hemiparesis ([place])   . Open wound   . Benign essential HTN   . Chronic atrial fibrillation ([place])   . Prerenal azotemia   . Primary insomnia   . Mixed hyperlipidemia   . ICH (intracerebral hemorrhage) ([place]) - small R thalamic d/t HTN 07/28/2016    Past Medical History:  Past Medical History:  Diagnosis Date  . Hypertension   . Mild aortic insufficiency   . Mild aortic sclerosis ([place])   . Obesity   . Osteopenia   . Prostate cancer ([place])    STAGE 2  . Right bundle branch block (RBBB)   . Vitamin D deficiency    Past Surgical History: No past surgical history on file.  Assessment & Plan Clinical Impression: Patient is a 82 y.o. year old male with recent admission to the hospital with history for HTN, prostate cancer, mild AS/AI, CAF--no Xarelto due to allergy; who was admitted on 07/28/16 with left sided weakness and mild left facial weakness with mild dysarthria.  BP elevated in ED and CT head done showing acute right thalamic hemorrhage with mild localized edema. Dr. Erlinda Hong felt that hemorrhage was due to hypertensive emergency and to consider eliquis in the future once ICH resolves. 2D echo with EF 55-60% with grade 1 diastolic dysfunction and moderate aortic  stenosis. Carotid dopplers without significant stenosis.  Hydralazine added for better BP control but he developed itching therefore this was changed to Norvasc. Patient with resultant left sided weakness affecting ability to carry out ADL tasks and mobility.  Patient transferred to CIR on 08/02/2016 .   Patient currently requires max +2 with mobility secondary to muscle weakness and muscle joint tightness, decreased cardiorespiratoy endurance, ataxia and decreased coordination and decreased sitting balance, decreased standing balance, decreased postural control, hemiplegia and decreased balance strategies.  Prior to hospitalization, patient was modified independent  with mobility and lived with Spouse in a House home.  Home access is 3Stairs to enter.  Patient will benefit from skilled PT intervention to maximize safe functional mobility, minimize fall risk and decrease caregiver burden for planned discharge home with 24 hour supervision.  Anticipate patient will benefit from follow up [place] at discharge.  PT - End of Session Activity Tolerance: Decreased this session Endurance Deficit: Yes Endurance Deficit Description: pt requires extra time and to move slowly due to LLE pain PT Assessment Rehab Potential (ACUTE/IP ONLY): Good Barriers to Discharge: [place] home environment (may require ramp if unable to do stairs) PT Patient demonstrates impairments in the following area(s): Balance;Endurance;Motor;Pain;Safety;Skin Integrity;Sensory PT Transfers Functional Problem(s): Bed Mobility;Bed to Chair;Car;Furniture PT Locomotion Functional Problem(s): Wheelchair Mobility;Ambulation;Stairs PT Plan PT Intensity: Minimum of 1-2 x/day ,45 to 90 minutes PT Frequency: 5 out of 7 days PT Duration Estimated Length of Stay: 14-21 days PT Treatment/Interventions: Training and development officer;Ambulation/gait training;Discharge planning;Disease management/prevention;DME/adaptive  equipment instruction;Functional  mobility training;Neuromuscular re-education;Pain management;Patient/family education;Psychosocial support;Skin care/wound management;Splinting/orthotics;Stair training;Therapeutic Activities;Therapeutic Exercise;UE/LE Strength taining/ROM;UE/LE Coordination activities;Wheelchair propulsion/positioning PT Transfers Anticipated Outcome(s): min assist overall PT Locomotion Anticipated Outcome(s): supervision w/c mobility; mod assist gait PT Recommendation Follow Up Recommendations: Home health PT;24 hour supervision/assistance Patient destination: Home Equipment Recommended: To be determined (has RW and some kind of w/c (transport chair?))  Skilled Therapeutic Intervention Evaluation completed (see details above and below) with education on PT POC and goals and individual treatment initiated with focus on postural control, transfers, sit <> stands (without equipment, with Stedy lift and with parallel bars), and overall endurance. Pt unable to complete sit <> stand from low recliner with just 1 person without equipment or with the [facility], so required +2 for sit <> stand to transfer to w/c via Stedy. In parallel bars pt able to complete with max assist of 1 (unable to push up but able to pull up on bars) with verbal cues for technique and sequencing. Pt able to maintain standing position about 10 seconds and initiate forward step x 2 reps but then requires return to seated position. Pt very limited by endurance, pain in LLE, and need to go very slow for all movements.    PT Evaluation Precautions/Restrictions Precautions Precautions: Fall Precaution Comments: Lt hemiparesis and ataxia Restrictions Weight Bearing Restrictions: No Pain Premedicated for pain in LLE. Home Living/Prior Functioning Home Living Available Help at Discharge: Family;Personal care attendant;Available 24 hours/day (aide 3x/week) Type of Home: House Home Access: Stairs to enter [employer] of Steps: 3 Entrance  Stairs-Rails: Left Home Layout: One level (one level with a basement) Bathroom Shower/Tub: Tub/shower unit;Walk-in shower (pt typically uses tub/shower as it has grab bars.  However he has a shower seat and could transition to walk-in if needed) Bathroom Toilet: Handicapped height Bathroom Accessibility: Yes Additional Comments: aide does lifting, laundry, etc. does not have to assist pt with his adls  Lives With: Spouse Prior Function Level of Independence: Independent with basic ADLs;Independent with gait;Independent with transfers;Requires assistive device for independence;Needs assistance with homemaking  Able to Take Stairs?: Yes Driving: No Vocation: Retired Comments: rollator for all mobility. Independent with BADL Vision/Perception  Vision - Assessment Additional Comments: needs further assessment.   Cognition Overall Cognitive Status: Within Functional Limits for tasks assessed Arousal/Alertness: Awake/alert Attention: Sustained Sustained Attention: Appears intact Memory: Appears intact Awareness: Appears intact Safety/Judgment: Appears intact Sensation Sensation Light Touch: Appears Intact Coordination Gross Motor Movements are Fluid and Coordinated: No Coordination and Movement Description: ataxia LUE; need to assess LE further Motor  Motor Motor: Abnormal postural alignment and control;Hemiplegia;Ataxia     Trunk/Postural Assessment  Cervical Assessment Cervical Assessment:  (forward head) Thoracic Assessment Thoracic Assessment:  (kyphotic) Lumbar Assessment Lumbar Assessment:  (posterior tilt; poor dissociation) Postural Control Postural Control: Deficits on evaluation Trunk Control: impaired - posterior lean Righting Reactions: delayed and inadequate Postural Limitations: kyphotic posture  Balance Balance Balance Assessed: Yes Static Sitting Balance Static Sitting - Level of Assistance: 4: Min assist Dynamic Sitting Balance Dynamic Sitting - Level of  Assistance: 3: Mod assist Static Standing Balance Static Standing - Level of Assistance: 2: Max assist Dynamic Standing Balance Dynamic Standing - Level of Assistance: 1: +2 Total assist Extremity Assessment   see OT eval for UE assessment.   RLE Assessment RLE Assessment:  (grossly WFL; decreased muscular endurance) LLE Assessment LLE Assessment:  (grossly 3- to 3-/5)   See Function Navigator for Current Functional Status.   Refer to Care Plan for Long  Term Goals  Recommendations for other services: None  Discharge Criteria: Patient will be discharged from PT if patient refuses treatment 3 consecutive times without medical reason, if treatment goals not met, if there is a change in medical status, if patient makes no progress towards goals or if patient is discharged from hospital.  The above assessment, treatment plan, treatment alternatives and goals were discussed and mutually agreed upon: by patient  Juanna Cao, PT, DPT  08/03/2016, 12:06 PM

## 2016-08-04 ENCOUNTER — Inpatient Hospital Stay (HOSPITAL_COMMUNITY): Payer: Medicare Other | Admitting: Occupational Therapy

## 2016-08-04 ENCOUNTER — Inpatient Hospital Stay (HOSPITAL_COMMUNITY): Payer: Medicare Other | Admitting: Physical Therapy

## 2016-08-04 ENCOUNTER — Inpatient Hospital Stay (HOSPITAL_COMMUNITY): Payer: Medicare Other

## 2016-08-04 MED ORDER — SENNOSIDES-DOCUSATE SODIUM 8.6-50 MG PO TABS
2.0000 | ORAL_TABLET | Freq: Two times a day (BID) | ORAL | Status: DC
  Administered 2016-08-04 – 2016-08-16 (×25): 2 via ORAL
  Filled 2016-08-04 (×26): qty 2

## 2016-08-04 MED ORDER — MUSCLE RUB 10-15 % EX CREA
TOPICAL_CREAM | Freq: Three times a day (TID) | CUTANEOUS | Status: DC
  Administered 2016-08-04 – 2016-08-06 (×5): via TOPICAL
  Administered 2016-08-06: 1 via TOPICAL
  Administered 2016-08-06 – 2016-08-07 (×4): via TOPICAL
  Administered 2016-08-07: 1 via TOPICAL
  Administered 2016-08-07 – 2016-08-09 (×6): via TOPICAL
  Filled 2016-08-04: qty 85

## 2016-08-04 MED ORDER — SORBITOL 70 % SOLN
45.0000 mL | Freq: Every day | Status: DC | PRN
  Administered 2016-08-04 – 2016-08-16 (×4): 45 mL via ORAL
  Filled 2016-08-04 (×4): qty 60

## 2016-08-04 MED ORDER — TRAMADOL-ACETAMINOPHEN 37.5-325 MG PO TABS
1.0000 | ORAL_TABLET | ORAL | Status: DC | PRN
  Administered 2016-08-04: 2 via ORAL
  Administered 2016-08-04: 1 via ORAL
  Administered 2016-08-04 – 2016-08-05 (×3): 2 via ORAL
  Filled 2016-08-04: qty 1
  Filled 2016-08-04 (×4): qty 2

## 2016-08-04 NOTE — Progress Notes (Signed)
82 year old male with history fo HTN, prostate cancer, mild AS/AI, CAF--no Xarelto due to allergy; who was admitted on 07/28/16 with left sided weakness and mild left facial weakness with mild dysarthria.  BP elevated in ED and CT head done showing acute right thalamic hemorrhage with mild localized edema. Dr. Erlinda Hong felt that hemorrhage was due to hypertensive emergency and to consider eliquis in the future once ICH resolves. 2D echo with EF 55-60% with grade 1 diastolic dysfunction and moderate aortic stenosis. Carotid dopplers without significant stenosis  Subjective/Complaints: C/o LLE pain, 4 yr hx of muscle strain after a fall at home.  Did not seek medical care Cannot specify location of pain, but it interferes with sleep No back pain ROS- neg CP, SOB, N/V/D Objective: Vital Signs: Blood pressure (!) 149/77, pulse 96, temperature 97.6 F (36.4 C), temperature source Oral, resp. rate 16, SpO2 97 %. No results found. Results for orders placed or performed during the hospital encounter of 08/02/16 (from the past 72 hour(s))  Creatinine, serum     Status: Abnormal   Collection Time: 08/02/16  4:03 PM  Result Value Ref Range   Creatinine, Ser 1.33 (H) 0.61 - 1.24 mg/dL   GFR calc non Af Amer 44 (L) >60 mL/min   GFR calc Af Amer 51 (L) >60 mL/min    Comment: (NOTE) The eGFR has been calculated using the CKD EPI equation. This calculation has not been validated in all clinical situations. eGFR's persistently <60 mL/min signify possible Chronic Kidney Disease.   CBC with Differential/Platelet     Status: None   Collection Time: 08/03/16  5:04 AM  Result Value Ref Range   WBC 7.2 4.0 - 10.5 K/uL   RBC 4.44 4.22 - 5.81 MIL/uL   Hemoglobin 13.9 13.0 - 17.0 g/dL   HCT 42.2 39.0 - 52.0 %   MCV 95.0 78.0 - 100.0 fL   MCH 31.3 26.0 - 34.0 pg   MCHC 32.9 30.0 - 36.0 g/dL   RDW 14.4 11.5 - 15.5 %   Platelets 167 150 - 400 K/uL   Neutrophils Relative % 71 %   Neutro Abs 5.1 1.7 - 7.7 K/uL    Lymphocytes Relative 19 %   Lymphs Abs 1.3 0.7 - 4.0 K/uL   Monocytes Relative 8 %   Monocytes Absolute 0.6 0.1 - 1.0 K/uL   Eosinophils Relative 2 %   Eosinophils Absolute 0.1 0.0 - 0.7 K/uL   Basophils Relative 0 %   Basophils Absolute 0.0 0.0 - 0.1 K/uL  Comprehensive metabolic panel     Status: Abnormal   Collection Time: 08/03/16  5:04 AM  Result Value Ref Range   Sodium 141 135 - 145 mmol/L   Potassium 3.9 3.5 - 5.1 mmol/L   Chloride 109 101 - 111 mmol/L   CO2 23 22 - 32 mmol/L   Glucose, Bld 122 (H) 65 - 99 mg/dL   BUN 30 (H) 6 - 20 mg/dL   Creatinine, Ser 1.20 0.61 - 1.24 mg/dL   Calcium 9.3 8.9 - 10.3 mg/dL   Total Protein 5.8 (L) 6.5 - 8.1 g/dL   Albumin 3.3 (L) 3.5 - 5.0 g/dL   AST 48 (H) 15 - 41 U/L   ALT 53 17 - 63 U/L   Alkaline Phosphatase 59 38 - 126 U/L   Total Bilirubin 1.4 (H) 0.3 - 1.2 mg/dL   GFR calc non Af Amer 50 (L) >60 mL/min   GFR calc Af Amer 58 (L) >60  mL/min    Comment: (NOTE) The eGFR has been calculated using the CKD EPI equation. This calculation has not been validated in all clinical situations. eGFR's persistently <60 mL/min signify possible Chronic Kidney Disease.    Anion gap 9 5 - 15      General: No acute distress Mood and affect are appropriate Heart: Regular rate and rhythm no rubs murmurs or extra sounds, - JVD Lungs: Clear to auscultation, breathing unlabored, no rales or wheezes Abdomen: Positive bowel sounds, soft nontender to palpation, nondistended Extremities: No clubbing, cyanosis, or edema Skin: No evidence of breakdown, no evidence of rash Neurologic: Cranial nerves II through XII intact, motor strength is 5/5 in R deltoid, bicep, tricep, grip, hip flexor, knee extensors, ankle dorsiflexor and plantar flexor, 4/5 on left  Sensory exam normal sensation to light touch  bilateral upper and lower extremities Cerebellar exam normal finger to nose to finger as well as heel to shin in bilateral  upperextremities Musculoskeletal: Pain with left hip abd , int/ext rotation, limited ROM oth hips, no pain to palp over knee, no effusion, no pain with ROM, Pedal edema, but no ankle or foot pain with palp or ROM, neg Homans, neg SLR   Assessment/Plan: 1. 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 Physiatrist is providing close team supervision and 24 hour management of active medical problems listed below. Physiatrist and rehab team continue to assess barriers to discharge/monitor patient progress toward functional and medical goals. FIM: Function - Bathing Position: Wheelchair/chair at sink Body parts bathed by patient: Right arm, Left arm, Chest, Abdomen, Front perineal area, Right upper leg, Left upper leg Body parts bathed by helper: Buttocks, Right lower leg, Left lower leg, Back Assist Level: 2 helpers  Function- Upper Body Dressing/Undressing What is the patient wearing?: Pull over shirt/dress Pull over shirt/dress - Perfomed by patient: Thread/unthread right sleeve, Thread/unthread left sleeve, Put head through opening Pull over shirt/dress - Perfomed by helper: Pull shirt over trunk Assist Level: Touching or steadying assistance(Pt > 75%) Function - Lower Body Dressing/Undressing What is the patient wearing?: Pants, Non-skid slipper socks Position: Wheelchair/chair at sink Pants- Performed by helper: Thread/unthread right pants leg, Thread/unthread left pants leg, Pull pants up/down Non-skid slipper socks- Performed by helper: Don/doff right sock, Don/doff left sock Assist for footwear: Maximal assist Assist for lower body dressing: 2 Helpers     Function - Air cabin crew transfer assistive device: Bedside commode Assist level to toilet: Maximal assist (Pt 25 - 49%/lift and lower) Assist level to bedside commode (at bedside): Maximal assist (Pt 25 -  49%/lift and lower) Assist level from bedside commode (at bedside): Maximal assist (Pt 25 - 49%/lift and lower)  Function - Chair/bed transfer Chair/bed transfer method: Other Chair/bed transfer assist level: 2 helpers Chair/bed transfer assistive device: Mechanical lift Mechanical lift: Stedy Chair/bed transfer details: Manual facilitation for weight shifting  Function - Locomotion: Wheelchair Will patient use wheelchair at discharge?: Yes Type: Manual Assist Level: Dependent (Pt equals 0%) (due to time constraints) Function - Locomotion: Ambulation Assistive device: Parallel bars Max distance: 1' Assist level: Maximal assist (Pt 25 - 49%) Walk 10 feet activity did not occur: Safety/medical concerns Walk 50 feet with 2 turns activity did not occur: Safety/medical concerns Walk 150 feet activity did not occur: Safety/medical concerns Walk 10 feet on uneven surfaces activity did not occur: Safety/medical concerns  Function - Comprehension Comprehension: Auditory Comprehension assist level:  Follows complex conversation/direction with extra time/assistive device  Function - Expression Expression: Verbal Expression assist level: Expresses complex ideas: With extra time/assistive device  Function - Social Interaction Social Interaction assist level: Interacts appropriately 90% of the time - Needs monitoring or encouragement for participation or interaction.  Function - Problem Solving Problem solving assist level: Solves complex 90% of the time/cues < 10% of the time  Function - Memory Memory assist level: Recognizes or recalls 75 - 89% of the time/requires cueing 10 - 24% of the time Patient normally able to recall (first 3 days only): Current season, Staff names and faces, That he or she is in a hospital  Medical Problem List and Plan: 1.  Weakness, gait instability secondary to right internal capsule/thalamic hemorrhage. Left hemiataxia, Team conference today please see  physician documentation under team conference tab, met with team face-to-face to discuss problems,progress, and goals. Formulized individual treatment plan based on medical history, underlying problem and comorbidities. 2.  DVT Prophylaxis/Anticoagulation: Pharmaceutical: Lovenox, eventual plan for Eliquis 3. Pain Management: PRN tylenol 4. Mood: LCSW to follow for evaluation and support.  5. Neuropsych: This patient is capable of making decisions on his own behalf. 6. Skin/Wound Care: Routine pressure relief measures.              Will consider santly to left elbow wound.  7. Fluids/Electrolytes/Nutrition: Encourage fluid intake and offer supplements between meals. Monitor I/O. Check lytes in am.  8. XHF:SFSELTR:-VUYEBXID ON Norvasc, Lisinopril and Lasix daily. SBP goal<160.  Vitals:   08/03/16 1330 08/04/16 0437  BP: 101/68 (!) 149/77  Pulse: 88 96  Resp: 17 16  Temp: 98.4 F (36.9 C) 97.6 F (36.4 C)    9. HWY:SHUOHFG HR bid--has been controlled.  10. Pre-renal azotemia: Encourage po fluids. Add supplements between meals. Elevated BUN with nl creat   11. Chronic insomnia: sleeps in a recliner at nights. Pt states this is due to nasal congestion will try ocean nasal spray 12. Hyperlipidemia: Pravastatin added this admission.  13.  Left hip pain suspect OA check xray, start ultracet  LOS (Days) 2 A FACE TO FACE EVALUATION WAS PERFORMED  Narda Fundora E 08/04/2016, 8:31 AM

## 2016-08-04 NOTE — Patient Care Conference (Signed)
Inpatient RehabilitationTeam Conference and Plan of Care Update Date: 08/04/2016   Time: 11:00 AM    Patient Name: Thomas Burnett      Medical Record Number: [id number]  Date of Birth: June 04, 1920 Sex: Male         Room/Bed: 4W24C/4W24C-01 Payor Info: Payor: MEDICARE / Plan: MEDICARE PART A AND B / Product Type: *No Product type* /    Admitting Diagnosis: CVA  Admit Date/Time:  08/02/2016  3:33 PM Admission Comments: No comment available   Primary Diagnosis:  <principal problem not specified> Principal Problem: <principal problem not specified>  Patient Active Problem List   Diagnosis Date Noted  . Hypertensive emergency 08/02/2016  . Chronic a-fib ([place]) 08/02/2016  . Hyperlipidemia 08/02/2016  . Obesity 08/02/2016  . Prostate cancer ([place]) 08/02/2016  . Right bundle branch block 08/02/2016  . Nontraumatic thalamic hemorrhage ([place]) 08/02/2016  . Gait disturbance, post-stroke   . Left hemiparesis ([place])   . Open wound   . Benign essential HTN   . Chronic atrial fibrillation ([place])   . Prerenal azotemia   . Primary insomnia   . Mixed hyperlipidemia   . ICH (intracerebral hemorrhage) ([place]) - small R thalamic d/t HTN 07/28/2016    Expected Discharge Date: Expected Discharge Date: 08/23/16  Team Members Present: Physician leading conference: Dr. Alysia Penna Social Worker Present: Ovidio Kin, LCSW Nurse Present: Heather Roberts, RN PT Present: Dwyane Dee, PT OT Present: Simonne Come, OT SLP Present: Windell Moulding, SLP PPS Coordinator present : Daiva Nakayama, RN, CRRN     Current Status/Progress Goal Weekly Team Focus  Medical   Left hip pain, OA, 2 person assist,   improve pain control  med management of LE pain   Bowel/Bladder   continent of bowel and bladder  Pt to remain continent of bowel and bladder.   Maintain and monitor continence   Swallow/Nutrition/ Hydration             ADL's   +2 for any transfers or LB self-care tasks  min assist overall, mod  assist toileting  sit > stand, LUE NMR, transfers, ADL retraining   Mobility   max>total overall  min for transfers, supervision w/c, mod on stairs  d/c planning (ramp?), transfers, gait, activity tolerance   Communication             Safety/Cognition/ Behavioral Observations            Pain   Tylenol 650mg  q 6hrs prn for headache  <3  Monitor for nonverbal cues of pain to head as well as LUE/LLE    Skin   Skin tear to R elbow, gauze to area. Abrasion to R shin, OTA  No additonal skin breakdown  Assess skin q shift      *See Care Plan and progress notes for long and short-term goals.  Barriers to Discharge: verbose, distractable, wife has physical limitations    Possible Resolutions to Barriers:  Cont rehab, pt will need hired caregiver at home    Discharge Planning/Teaching Needs:  Wife and pt want him to go home, but wife limited to supervision level. Has hired assist will see if can increase her hours. See how pt progresses.      Team Discussion:  Pt having pain in his hips kept awake last night. X-ray today-showed arthritis in both hips. MD to prescribe pain meds for this. Goals min-mod level of assistance. Distractable in therapies and likes to talk. Has hired caregiver but will see if can manage  at projected levels.   Revisions to Treatment Plan:  Pain interfering with therapy participation   Continued Need for Acute Rehabilitation Level of Care: The patient requires daily medical management by a physician with specialized training in physical medicine and rehabilitation for the following conditions: Daily direction of a multidisciplinary physical rehabilitation program to ensure safe treatment while eliciting the highest outcome that is of practical value to the patient.: Yes Daily medical management of patient stability for increased activity during participation in an intensive rehabilitation regime.: Yes Daily analysis of laboratory values and/or radiology reports with  any subsequent need for medication adjustment of medical intervention for : Other;Neurological problems  [name] 08/04/2016, 3:28 PM

## 2016-08-04 NOTE — Plan of Care (Signed)
Problem: RH PAIN MANAGEMENT Goal: RH STG PAIN MANAGED AT OR BELOW PT'S PAIN GOAL Less than 3,on 1 to 10 scale.  Outcome: Progressing Patient states his pain goal is 0

## 2016-08-04 NOTE — Progress Notes (Signed)
Occupational Therapy Session Note  Patient Details  Name: Thomas Burnett MRN: [id number] Date of Birth: 03/09/1920  Today's Date: 08/04/2016 OT Individual Time: 1130-1207 and 1300-1400 OT Individual Time Calculation (min): 37 min and 60 min  and Today's Date: 08/04/2016 OT Missed Time: 23 Minutes Missed Time Reason: Patient fatigue     Short Term Goals: Week 1:  OT Short Term Goal 1 (Week 1): Pt will complete LB dressing with max assist of one caregiver OT Short Term Goal 2 (Week 1): Pt will complete bathing with mod assist of one caregiver OT Short Term Goal 3 (Week 1): Pt will complete squat pivot transfer to toilet with max assist of one caregiver OT Short Term Goal 4 (Week 1): Pt will complet sit > stand with max assist of one caregiver to decrease burden of care with self-care tasks  Skilled Therapeutic Interventions/Progress Updates:    1) Treatment session with focus on increased participation in functional mobility and recovery.  Pt received in bed with wife and hired caregiver present visiting pt.  Pt initially scheduled for 0730 but sound asleep with difficulty arousing and requesting to sleep.  Therefore pt still in bed upon arrival.  Completed bed mobility with max encouragement for pt to attempt to move legs in bed as pt requesting therapist move them "slowly" for him.  Pt with ability to advance legs to EOB with use of RLE to assist LLE.  Max assist to come to sitting at EOB due to kyphosis and tendency to push backwards.  Utilized Stedy for transfer bed > recliner.  Pt able to pull up into Stedy with mod assist initially fading to min assist with subsequent transfers. Upon sitting in recliner pt reports urge to have BM.  Transferred recliner to [facility] with use of Stedy and only min assist for weight shifting and then pt able to pull up using Stedy bar.  Therapist assisted in pulling down pants prior to toileting.  Left on toilet with NT aware and pt with call bell in hand.  2)  Treatment session with focus on dynamic sitting balance, trunk rotation, and functional use of LUE.  Pt received in recliner reporting no pain but perseverating on need for pain medication to participate in therapy sessions due to fear of impending pain.  Performed transfer recliner > w/c with use of Stedy with mod assist from lower surface.  Engaged in dynamic sitting balance at edge of therapy mat with focus on trunk control with reaching outside BOS.  Challenged pt to reach across midline with LUE to obtain ring and then place it on vertical dowel on Lt.  Pt demonstrating increased ataxia when reaching outside of body.  Noted motor impersistence with reaching activity.  Pt resistant to reaching down towards floor reporting extreme fear of falling, therefore moved target to a low stool to still promote weight shift and challenge.  Engaged in therapeutic pet therapy with use of LUE to pet therapy dog with min cues for upright sitting balance. Maintained upright sitting balance with min tactile cues while awaiting arrival of PT.  Notified RN of requests for pain medications, also discussed whether pain meds need to be scheduled as pt continues to perseverate on pain.  Therapy Documentation Precautions:  Precautions Precautions: Fall Precaution Comments: Lt hemiparesis and ataxia Restrictions Weight Bearing Restrictions: No General: General OT Amount of Missed Time: 23 Minutes Vital Signs:  Pain: Pain Assessment Pain Assessment: Faces Faces Pain Scale: Hurts whole lot Pain Type: Acute pain Pain  Location: Leg Pain Orientation: Left Pain Intervention(s): Medication (See eMAR)  See Function Navigator for Current Functional Status.   Therapy/Group: Individual Therapy  Simonne Come 08/04/2016, 12:28 PM

## 2016-08-04 NOTE — Progress Notes (Signed)
Social Work Patient ID: Thomas Burnett, male   DOB: 10/29/1919, 82 y.o.   MRN: [id number]  Met with pt, wife and caregiver to discuss team conference goals min/mod level and target discharge 11/20. Pt will do his best, wife voiced can see if can increase caregiver's hours, but the level he will be if reaches the goals is a lot of care. She plans to be here and observe him in therapies, but can not Assist with his care due to her own health issues. Will work on a safe discharge plan and see how pt progresses while here.

## 2016-08-04 NOTE — Progress Notes (Signed)
Physical Therapy Session Note  Patient Details  Name: Thomas Burnett MRN: [id number] Date of Birth: Feb 03, 1920  Today's Date: 08/04/2016 PT Individual Time: 2904-7533 PT Individual Time Calculation (min): 75 min    Short Term Goals: Week 1:  PT Short Term Goal 1 (Week 1): Pt will be able to perform functional transfers with max assist PT Short Term Goal 2 (Week 1): Pt will be able to initiate gait with max +2 assist PT Short Term Goal 3 (Week 1): Pt will be able to propel w/c in controlled environment with min assist x 100'  Skilled Therapeutic Interventions/Progress Updates:    Handoff from OT in therapy gym.  Pt c/o cramping in L thigh, RN in during session to medicate.  Session focus on attention, transfers, w/c positioning, and standing tolerance.  Squat/pivot with +2 assist from mat>w/c.  Pt requiring increased time and max assist to position hips all the way back in the w/c.  Sit<>stand x2 from w/c with therapist positioned in arm chair in front of pt and x1 with EVA walker.  Pt requires max assist for sit<>stand and mod verbal cues for upright posture and midline orientation.  Pt unable to take steps 2/2 pain in LLE.  PT applied half lap tray for LUE positioning in w/c and provided pt education regarding positioning of hips for maximized sitting tolerance.  Pt verbalized understanding.  W/C propulsion x50' with R hemi-technique and overall mod assist for avoiding obstacles.  Pt returned to room at end of session and positioned upright in w/c with call bell in reach and needs met.   Therapy Documentation Precautions:  Precautions Precautions: Fall Precaution Comments: Lt hemiparesis and ataxia Restrictions Weight Bearing Restrictions: No   See Function Navigator for Current Functional Status.   Therapy/Group: Individual Therapy  Earnest Conroy Penven-Crew 08/04/2016, 4:26 PM

## 2016-08-04 NOTE — Progress Notes (Signed)
Social Work Thomas Hashimoto, LCSW Social Worker Signed   Patient Care Conference Date of Service: 08/04/2016  3:28 PM      Hide copied text Hover for attribution information Inpatient RehabilitationTeam Conference and Plan of Care Update Date: 08/04/2016   Time: 11:00 AM      Patient Name: Thomas Burnett      Medical Record Number: [id number]  Date of Birth: [birth year] Sex: Male         Room/Bed: 4W24C/4W24C-01 Payor Info: Payor: MEDICARE / Plan: MEDICARE PART A AND B / Product Type: *No Product type* /     Admitting Diagnosis: CVA  Admit Date/Time:  08/02/2016  3:33 PM Admission Comments: No comment available    Primary Diagnosis:  <principal problem not specified> Principal Problem: <principal problem not specified>       Patient Active Problem List    Diagnosis Date Noted  . Hypertensive emergency 08/02/2016  . Chronic a-fib ([place]) 08/02/2016  . Hyperlipidemia 08/02/2016  . Obesity 08/02/2016  . Prostate cancer ([place]) 08/02/2016  . Right bundle branch block 08/02/2016  . Nontraumatic thalamic hemorrhage ([place]) 08/02/2016  . Gait disturbance, post-stroke    . Left hemiparesis ([place])    . Open wound    . Benign essential HTN    . Chronic atrial fibrillation ([place])    . Prerenal azotemia    . Primary insomnia    . Mixed hyperlipidemia    . ICH (intracerebral hemorrhage) ([place]) - small R thalamic d/t HTN 07/28/2016      Expected Discharge Date: Expected Discharge Date: 08/23/16   Team Members Present: Physician leading conference: Dr. Alysia Penna Social Worker Present: Ovidio Kin, LCSW Nurse Present: Heather Roberts, RN PT Present: Dwyane Dee, PT OT Present: Simonne Come, OT SLP Present: Windell Moulding, SLP PPS Coordinator present : Daiva Nakayama, RN, CRRN       Current Status/Progress Goal Weekly Team Focus  Medical   Left hip pain, OA, 2 person assist,   improve pain control  med management of LE pain   Bowel/Bladder   continent of bowel and bladder  Pt to  remain continent of bowel and bladder.   Maintain and monitor continence   Swallow/Nutrition/ Hydration             ADL's   +2 for any transfers or LB self-care tasks  min assist overall, mod assist toileting  sit > stand, LUE NMR, transfers, ADL retraining   Mobility   max>total overall  min for transfers, supervision w/c, mod on stairs  d/c planning (ramp?), transfers, gait, activity tolerance   Communication             Safety/Cognition/ Behavioral Observations           Pain   Tylenol 650mg  q 6hrs prn for headache  <3  Monitor for nonverbal cues of pain to head as well as LUE/LLE    Skin   Skin tear to R elbow, gauze to area. Abrasion to R shin, OTA  No additonal skin breakdown  Assess skin q shift       *See Care Plan and progress notes for long and short-term goals.   Barriers to Discharge: verbose, distractable, wife has physical limitations   Possible Resolutions to Barriers:  Cont rehab, pt will need hired caregiver at home   Discharge Planning/Teaching Needs:  Wife and pt want him to go home, but wife limited to supervision level. Has hired assist will see if can increase her hours. See  how pt progresses.      Team Discussion:  Pt having pain in his hips kept awake last night. X-ray today-showed arthritis in both hips. MD to prescribe pain meds for this. Goals min-mod level of assistance. Distractable in therapies and likes to talk. Has hired caregiver but will see if can manage at projected levels.   Revisions to Treatment Plan:  Pain interfering with therapy participation    Continued Need for Acute Rehabilitation Level of Care: The patient requires daily medical management by a physician with specialized training in physical medicine and rehabilitation for the following conditions: Daily direction of a multidisciplinary physical rehabilitation program to ensure safe treatment while eliciting the highest outcome that is of practical value to the patient.: Yes Daily medical  management of patient stability for increased activity during participation in an intensive rehabilitation regime.: Yes Daily analysis of laboratory values and/or radiology reports with any subsequent need for medication adjustment of medical intervention for : Other;Neurological problems   Thomas Burnett 08/04/2016, 3:28 PM       Patient ID: Thomas Burnett, male   DOB: November 20, 1919, 82 y.o.   MRN: [id number]

## 2016-08-05 ENCOUNTER — Inpatient Hospital Stay (HOSPITAL_COMMUNITY): Payer: Medicare Other | Admitting: Physical Therapy

## 2016-08-05 ENCOUNTER — Inpatient Hospital Stay (HOSPITAL_COMMUNITY): Payer: Medicare Other | Admitting: *Deleted

## 2016-08-05 ENCOUNTER — Inpatient Hospital Stay (HOSPITAL_COMMUNITY): Payer: Medicare Other

## 2016-08-05 MED ORDER — TRAMADOL HCL 50 MG PO TABS
50.0000 mg | ORAL_TABLET | Freq: Four times a day (QID) | ORAL | Status: DC | PRN
  Administered 2016-08-06 – 2016-08-18 (×26): 50 mg via ORAL
  Filled 2016-08-05 (×28): qty 1

## 2016-08-05 MED ORDER — ACETAMINOPHEN-CODEINE #3 300-30 MG PO TABS
1.0000 | ORAL_TABLET | Freq: Every evening | ORAL | Status: DC | PRN
Start: 2016-08-05 — End: 2016-08-11
  Administered 2016-08-05 – 2016-08-06 (×2): 1 via ORAL
  Administered 2016-08-08 – 2016-08-10 (×4): 2 via ORAL
  Filled 2016-08-05 (×2): qty 2
  Filled 2016-08-05: qty 1
  Filled 2016-08-05 (×4): qty 2

## 2016-08-05 NOTE — Progress Notes (Signed)
Speech Language Pathology Note  Patient Details  Name: Thomas Burnett MRN: [id number] Date of Birth: May 15, 1920 Today's Date: 08/05/2016  New orders received today after SLP evaluation was completed yesterday; rec no additional ST follow up.  Spoke with PA who reported orders were entered in duplicate.  No additional ST changes indicated at this time as pt with no change in function.    Kyrah Schiro, Selinda Orion 08/05/2016, 2:59 PM

## 2016-08-05 NOTE — IPOC Note (Signed)
Overall Plan of Care [facility]) Patient Details Name: Thomas Burnett MRN: [id number] DOB: 1920/01/12  Admitting Diagnosis: CVA  Hospital Problems: Active Problems:   Nontraumatic thalamic hemorrhage ([place])   Gait disturbance, post-stroke   Left hemiparesis (HCC)   Open wound   Benign essential HTN   Chronic atrial fibrillation (HCC)   Prerenal azotemia   Primary insomnia   Mixed hyperlipidemia     Functional Problem List: Nursing Bladder, Bowel, Endurance, Motor, Safety, Skin Integrity  PT Balance, Endurance, Motor, Pain, Safety, Skin Integrity, Sensory  OT Balance, Endurance, Motor, Pain, Perception, Safety, Skin Integrity  SLP    TR         Basic ADL's: OT Eating, Grooming, Bathing, Dressing, Toileting     Advanced  ADL's: OT       Transfers: PT Bed Mobility, Bed to Chair, Car, Manufacturing systems engineer, Metallurgist: PT Emergency planning/management officer, Ambulation, Stairs     Additional Impairments: OT Fuctional Use of Upper Extremity  SLP        TR      Anticipated Outcomes Item Anticipated Outcome  Self Feeding Supervision/setup  Swallowing      Basic self-care  Min assist  Toileting  Min assist   Bathroom Transfers Min assist  Bowel/Bladder  Continent to bowel and bladder with min. assist.  Transfers  min assist overall  Locomotion  supervision w/c mobility; mod assist gait  Communication     Cognition     Pain  Less than 3,on 1 to 10 scale.  Safety/Judgment  Free from falls during his stay in rehab.   Therapy Plan: PT Intensity: Minimum of 1-2 x/day ,45 to 90 minutes PT Frequency: 5 out of 7 days PT Duration Estimated Length of Stay: 14-21 days OT Intensity: Minimum of 1-2 x/day, 45 to 90 minutes OT Frequency: 5 out of 7 days OT Duration/Estimated Length of Stay: 18-22 days         Team Interventions: Nursing Interventions Patient/Family Education, Skin Care/Wound Management, Disease Management/Prevention, Discharge Planning  PT  interventions Balance/vestibular training, Ambulation/gait training, Discharge planning, Disease management/prevention, DME/adaptive equipment instruction, Functional mobility training, Neuromuscular re-education, Pain management, Patient/family education, Psychosocial support, Skin care/wound management, Splinting/orthotics, Stair training, Therapeutic Activities, Therapeutic Exercise, UE/LE Strength taining/ROM, UE/LE Coordination activities, Wheelchair propulsion/positioning  OT Interventions Balance/vestibular training, Discharge planning, Disease mangement/prevention, DME/adaptive equipment instruction, Functional mobility training, Neuromuscular re-education, Pain management, Patient/family education, Psychosocial support, Self Care/advanced ADL retraining, Skin care/wound managment, Therapeutic Activities, Therapeutic Exercise, UE/LE Strength taining/ROM, UE/LE Coordination activities, Wheelchair propulsion/positioning  SLP Interventions    TR Interventions    SW/CM Interventions Discharge Planning, Psychosocial Support, Patient/Family Education    Team Discharge Planning: Destination: PT-Home ,OT- Home , SLP-  Projected Follow-up: PT-Home health PT, 24 hour supervision/assistance, OT-  Home health OT, 24 hour supervision/assistance, SLP-None Projected Equipment Needs: PT-To be determined (has RW and some kind of w/c (transport chair?)), OT- 3 in 1 bedside comode, Tub/shower bench, SLP-  Equipment Details: PT- , OT-  Patient/family involved in discharge planning: PT- Patient,  OT-Patient, SLP-Patient  MD ELOS: 18-21d Medical Rehab Prognosis:  Good Assessment: 82 year old male with history fo HTN, prostate cancer, mild AS/AI, CAF--no Xarelto due to allergy; who was admitted on 07/28/16 with left sided weakness and mild left facial weakness with mild dysarthria. BP elevated in ED and CT head done showing acute right thalamic hemorrhage with mild localized edema. Dr. Erlinda Hong felt that hemorrhage was  due to hypertensive emergency and  to consider eliquis in the future once ICH resolves. 2D echo with EF 55-60% with grade 1 diastolic dysfunction and moderate aortic stenosis. Carotid dopplers without significant stenosis    Now requiring 24/7 Rehab RN,MD, as well as CIR level PT, OT and SLP.  Treatment team will focus on ADLs and mobility with goals set at [facility] See Team Conference Notes for weekly updates to the plan of care

## 2016-08-05 NOTE — Discharge Instructions (Signed)
Inpatient Rehab Discharge Instructions  Kirke Shaggy Discharge date and time:    Activities/Precautions/ Functional Status: Activity: no lifting, driving, or strenuous exercise till cleared by MD.  Diet: cardiac diet Wound Care: none needed   Functional status:  ___ No restrictions     ___ Walk up steps independently ___ 24/7 supervision/assistance   ___ Walk up steps with assistance ___ Intermittent supervision/assistance  ___ Bathe/dress independently ___ Walk with walker     ___ Bathe/dress with assistance ___ Walk Independently    ___ Shower independently ___ Walk with assistance    ___ Shower with assistance ___ No alcohol     ___ Return to work/school ________  Special Instructions:    STROKE/TIA DISCHARGE INSTRUCTIONS SMOKING Cigarette smoking nearly doubles your risk of having a stroke & is the single most alterable risk factor  If you smoke or have smoked in the last 12 months, you are advised to quit smoking for your health.  Most of the excess cardiovascular risk related to smoking disappears within a year of stopping.  Ask you doctor about anti-smoking medications  [place] Quit Line: 1-800-QUIT NOW  Free Smoking Cessation Classes [phone]  CHOLESTEROL Know your levels; limit fat & cholesterol in your diet  Lipid Panel     Component Value Date/Time   CHOL 220 (H) 07/29/2016 1012   TRIG 85 07/30/2016 0507   HDL 61 07/29/2016 1012   CHOLHDL 3.6 07/29/2016 1012   VLDL 13 07/29/2016 1012   LDLCALC 146 (H) 07/29/2016 1012      Many patients benefit from treatment even if their cholesterol is at goal.  Goal: Total Cholesterol (CHOL) less than 160  Goal:  Triglycerides (TRIG) less than 150  Goal:  HDL greater than 40  Goal:  LDL (LDLCALC) less than 100   BLOOD PRESSURE American Stroke Association blood pressure target is less that 120/80 mm/Hg  Your discharge blood pressure is:  BP: (!) 146/89  Monitor your blood pressure  Limit your salt and  alcohol intake  Many individuals will require more than one medication for high blood pressure  DIABETES (A1c is a blood sugar average for last 3 months) Goal HGBA1c is under 7% (HBGA1c is blood sugar average for last 3 months)  Diabetes: No known diagnosis of diabetes    Lab Results  Component Value Date   HGBA1C 5.7 (H) 07/30/2016     Your HGBA1c can be lowered with medications, healthy diet, and exercise.  Check your blood sugar as directed by your physician  Call your physician if you experience unexplained or low blood sugars.  PHYSICAL ACTIVITY/REHABILITATION Goal is 30 minutes at least 4 days per week  Activity: No driving.  Therapies: See above Return to work: N/A  Activity decreases your risk of heart attack and stroke and makes your heart stronger.  It helps control your weight and blood pressure; helps you relax and can improve your mood.  Participate in a regular exercise program.  Talk with your doctor about the best form of exercise for you (dancing, walking, swimming, cycling).  DIET/WEIGHT Goal is to maintain a healthy weight  Your discharge diet is: Diet regular Room service appropriate? Yes; Fluid consistency: Thin  liquids Your height is:   5'6" Your current weight is: Weight: 81.2 kg (179 lb 0.2 oz) Your Body Mass Index (BMI) is:   29  Following the type of diet specifically designed for you will help prevent another stroke.  Your goal weight is:  155 lbs  Your goal Body Mass Index (BMI) is 19-24.  Healthy food habits can help reduce 3 risk factors for stroke:  High cholesterol, hypertension, and excess weight.  RESOURCES Stroke/Support Group:  Call [phone]   STROKE EDUCATION PROVIDED/REVIEWED AND GIVEN TO PATIENT Stroke warning signs and symptoms How to activate emergency medical system (call 911). Medications prescribed at discharge. Need for follow-up after discharge. Personal risk factors for stroke. Pneumonia vaccine given:  Flu vaccine  given:  My questions have been answered, the writing is legible, and I understand these instructions.  I will adhere to these goals & educational materials that have been provided to me after my discharge from the hospital.     My questions have been answered and I understand these instructions. I will adhere to these goals and the provided educational materials after my discharge from the hospital.  Patient/Caregiver Signature _______________________________ Date __________  Clinician Signature _______________________________________ Date __________  Please bring this form and your medication list with you to all your follow-up doctor's appointments.

## 2016-08-05 NOTE — Progress Notes (Signed)
Physical Therapy Session Note  Patient Details  Name: Thomas Burnett MRN: [id number] Date of Birth: [birth year]  Today's Date: 08/05/2016 PT Individual Time: 9798-9211 PT Individual Time Calculation (min): 70 min    Short Term Goals: Week 1:  PT Short Term Goal 1 (Week 1): Pt will be able to perform functional transfers with max assist PT Short Term Goal 2 (Week 1): Pt will be able to initiate gait with max +2 assist PT Short Term Goal 3 (Week 1): Pt will be able to propel w/c in controlled environment with min assist x 100'  Skilled Therapeutic Interventions/Progress Updates:    Pt resting in recliner on arrival, no c/o pain and agreeable to therapy session.  +2 to stand from low recliner into STEDY, supervision to stand from elevated STEDY when transferring to w/c.  Sit<>stand with Sara+ 2 trials and gait with Sara+ 2x10' with extended rest break in between.  Mod assist during gait to facilitate weight shift R<>L and min verbal cues for increased step length and sequencing.  Pt somewhat limited in sessions by verbosity and agitation when redirected.  Pt returned to room at end of session and transferred back to recliner with STEDY and +1 assist.  Pt left with call bell in reach and needs met.   Therapy Documentation Precautions:  Precautions Precautions: Fall Precaution Comments: Lt hemiparesis and ataxia Restrictions Weight Bearing Restrictions: No   See Function Navigator for Current Functional Status.   Therapy/Group: Individual Therapy  Earnest Conroy Penven-Crew 08/05/2016, 4:31 PM

## 2016-08-05 NOTE — Progress Notes (Signed)
Physical Therapy Session Note  Patient Details  Name: Thomas Burnett MRN: [id number] Date of Birth: 1919/12/05  Today's Date: 08/05/2016 PT Individual Time: 1000-1108 PT Individual Time Calculation (min): 68 min    Short Term Goals: Week 1:  PT Short Term Goal 1 (Week 1): Pt will be able to perform functional transfers with max assist PT Short Term Goal 2 (Week 1): Pt will be able to initiate gait with max +2 assist PT Short Term Goal 3 (Week 1): Pt will be able to propel w/c in controlled environment with min assist x 100'  Skilled Therapeutic Interventions/Progress Updates:    Pt resting in recliner on arrival, no c/o pain and rest but states "that L leg will hurt on it's own real bad," agreeable to therapy session.  Session focus on sit<>stand transfers, squat/pivot transfers, sitting balance, weight shifting, and forced use of LLE for pre-gait activity.  Sit<>stand x2 at rail in hallway with max assist and facilitation for L knee extension, mirror for visual feedback and pt performed L<>R weight shifts with therapist blocking L knee and providing mod cues for upright posture.  Pt able to take 1-2 steps forward with total assist for posture, L knee control, and placement of RUE on rail.  Squat/pivot w/c<>therapy mat to R side each time with +2 assist.  Pt able to assist with lift but with decreased ability to follow directions, maintain forward weight shift, and sequence transfer on each trial requiring total multimodal cues to complete.  Sitting balance seated edge of mat focus on forward/L weight shift with 2# weight on L wrist for improved proprioception during reaching task.  Attempted to stand from edge of mat but pt states, "nope. I'm done, we're going to get back in the w/c and roll me back to my room, I'm not doing any more!"  Pt returned to w/c in same manner as above and taken back to room.  Left upright in w/c with call bell in reach and needs met. Pt's private caregiver present in  room when PT exited.     Therapy Documentation Precautions:  Precautions Precautions: Fall Precaution Comments: Lt hemiparesis and ataxia Restrictions Weight Bearing Restrictions: No General: PT Amount of Missed Time (min): 7 Minutes PT Missed Treatment Reason: Patient fatigue;Pain    See Function Navigator for Current Functional Status.   Therapy/Group: Individual Therapy  Earnest Conroy Penven-Crew 08/05/2016, 12:39 PM

## 2016-08-05 NOTE — Plan of Care (Signed)
Pt's plan of care adjusted to 15/7 after speaking with care team and discussed with MD in team conference as pt currently unable to tolerate current therapy schedule with OT, PT, and SLP.    Dwyane Dee, PT, DPT 08/05/16 4:35 PM

## 2016-08-05 NOTE — Progress Notes (Signed)
Occupational Therapy Session Note  Patient Details  Name: REINALDO FETSKO MRN: [id number] Date of Birth: January 10, 1920  Today's Date: 08/05/2016 OT Individual Time: [id number] OT Individual Time Calculation (min): 75 min     Short Term Goals: Week 1:  OT Short Term Goal 1 (Week 1): Pt will complete LB dressing with max assist of one caregiver OT Short Term Goal 2 (Week 1): Pt will complete bathing with mod assist of one caregiver OT Short Term Goal 3 (Week 1): Pt will complete squat pivot transfer to toilet with max assist of one caregiver OT Short Term Goal 4 (Week 1): Pt will complet sit > stand with max assist of one caregiver to decrease burden of care with self-care tasks  Skilled Therapeutic Interventions/Progress Updates:    Treatment session with focus on sit <> stand, standing balance, and functional use of LUE during self-care tasks.  Pt willing to engage in bathing and dressing at sink side.  Pt requires increased time to complete functional tasks as pt tends to be quite verbose as well as easily distracted, requiring encouragement for redirection.  Mod assist +2 for sit > stand from low recliner with pt pushing up from recliner with one UE while other hand on sink for leverage.  Pt demonstrating good UE strength as able to pull self into standing and pushed up to scoot back in recliner.  Therapist blocked pt's Lt knee in standing due to decreased stability.  Required assist and cues to attempt tasks prior to requesting assist.  Left upright in recliner with pt's personal hired caregiver present to visit.  Therapy Documentation Precautions:  Precautions Precautions: Fall Precaution Comments: Lt hemiparesis and ataxia Restrictions Weight Bearing Restrictions: No Pain: Pain Assessment Pain Assessment: 0-10 Pain Score: 4  Pain Type: Acute pain Pain Location: Buttocks Pain Orientation: Left Pain Radiating Towards: hip Pain Descriptors / Indicators: Aching;Discomfort Pain  Frequency: Intermittent Pain Onset: On-going Pain Intervention(s): Medication (See eMAR);Emotional support;Repositioned Multiple Pain Sites: No  See Function Navigator for Current Functional Status.   Therapy/Group: Individual Therapy  Simonne Come 08/05/2016, 10:24 AM

## 2016-08-05 NOTE — Progress Notes (Signed)
Subjective/Complaints: CC Left hip/thigh pain and constipation ROS- neg CP, SOB, N/V/D, no abd pain Objective: Vital Signs: Blood pressure (!) 146/89, pulse 63, temperature 97.8 F (36.6 C), temperature source Oral, resp. rate 16, weight 81.2 kg (179 lb 0.2 oz), SpO2 96 %. Dg Hips Bilat With Pelvis 2v  Result Date: 08/04/2016 CLINICAL DATA:  Hip pain that radiates into left thigh. EXAM: DG HIP (WITH OR WITHOUT PELVIS) 2V BILAT COMPARISON:  CT 01/06/2000. FINDINGS: Degenerative changes lumbar spine and both hips. No acute bony abnormality identified. No evidence of fracture. Coarse pelvic calcification noted consistent with fibroid. Calcified pelvic densities consistent phleboliths noted. Aortoiliac atherosclerotic vascular calcification. Left nephrolithiasis. IMPRESSION: Degenerative changes lumbar spine and both hips. Diffuse osteopenia. No acute bony abnormality identified. 2. Probable calcified fibroid.  Left nephrolithiasis. 3.  Aortoiliac atherosclerotic vascular disease . Electronically Signed   By: Marcello Moores  Register   On: 08/04/2016 10:09   Results for orders placed or performed during the hospital encounter of 08/02/16 (from the past 72 hour(s))  Creatinine, serum     Status: Abnormal   Collection Time: 08/02/16  4:03 PM  Result Value Ref Range   Creatinine, Ser 1.33 (H) 0.61 - 1.24 mg/dL   GFR calc non Af Amer 44 (L) >60 mL/min   GFR calc Af Amer 51 (L) >60 mL/min    Comment: (NOTE) The eGFR has been calculated using the CKD EPI equation. This calculation has not been validated in all clinical situations. eGFR's persistently <60 mL/min signify possible Chronic Kidney Disease.   CBC with Differential/Platelet     Status: None   Collection Time: 08/03/16  5:04 AM  Result Value Ref Range   WBC 7.2 4.0 - 10.5 K/uL   RBC 4.44 4.22 - 5.81 MIL/uL   Hemoglobin 13.9 13.0 - 17.0 g/dL   HCT 42.2 39.0 - 52.0 %   MCV 95.0 78.0 - 100.0 fL   MCH 31.3 26.0 - 34.0 pg   MCHC 32.9 30.0 -  36.0 g/dL   RDW 14.4 11.5 - 15.5 %   Platelets 167 150 - 400 K/uL   Neutrophils Relative % 71 %   Neutro Abs 5.1 1.7 - 7.7 K/uL   Lymphocytes Relative 19 %   Lymphs Abs 1.3 0.7 - 4.0 K/uL   Monocytes Relative 8 %   Monocytes Absolute 0.6 0.1 - 1.0 K/uL   Eosinophils Relative 2 %   Eosinophils Absolute 0.1 0.0 - 0.7 K/uL   Basophils Relative 0 %   Basophils Absolute 0.0 0.0 - 0.1 K/uL  Comprehensive metabolic panel     Status: Abnormal   Collection Time: 08/03/16  5:04 AM  Result Value Ref Range   Sodium 141 135 - 145 mmol/L   Potassium 3.9 3.5 - 5.1 mmol/L   Chloride 109 101 - 111 mmol/L   CO2 23 22 - 32 mmol/L   Glucose, Bld 122 (H) 65 - 99 mg/dL   BUN 30 (H) 6 - 20 mg/dL   Creatinine, Ser 1.20 0.61 - 1.24 mg/dL   Calcium 9.3 8.9 - 10.3 mg/dL   Total Protein 5.8 (L) 6.5 - 8.1 g/dL   Albumin 3.3 (L) 3.5 - 5.0 g/dL   AST 48 (H) 15 - 41 U/L   ALT 53 17 - 63 U/L   Alkaline Phosphatase 59 38 - 126 U/L   Total Bilirubin 1.4 (H) 0.3 - 1.2 mg/dL   GFR calc non Af Amer 50 (L) >60 mL/min   GFR calc Af Amer 58 (  L) >60 mL/min    Comment: (NOTE) The eGFR has been calculated using the CKD EPI equation. This calculation has not been validated in all clinical situations. eGFR's persistently <60 mL/min signify possible Chronic Kidney Disease.    Anion gap 9 5 - 15      General: No acute distress Mood and affect are appropriate Heart: Regular rate and rhythm no rubs murmurs or extra sounds, - JVD Lungs: Clear to auscultation, breathing unlabored, no rales or wheezes Abdomen: Positive bowel sounds, soft nontender to palpation, nondistended Extremities: No clubbing, cyanosis, or edema Skin: No evidence of breakdown, no evidence of rash Neurologic: Cranial nerves II through XII intact, motor strength is 5/5 in R deltoid, bicep, tricep, grip, hip flexor, knee extensors, ankle dorsiflexor and plantar flexor, 4/5 on left  Sensory exam normal sensation to light touch  bilateral upper and  lower extremities Cerebellar exam normal finger to nose to finger as well as heel to shin in bilateral upperextremities Musculoskeletal: Pain with left hip abd , int/ext rotation, limited ROM oth hips, no pain to palp over knee, no effusion, no pain with ROM, Pedal edema, but no ankle or foot pain with palp or ROM, neg Homans, neg SLR   Assessment/Plan: 1. 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 Physiatrist is providing close team supervision and 24 hour management of active medical problems listed below. Physiatrist and rehab team continue to assess barriers to discharge/monitor patient progress toward functional and medical goals. FIM: Function - Bathing Position: Wheelchair/chair at sink Body parts bathed by patient: Right arm, Left arm, Chest, Abdomen, Front perineal area, Right upper leg, Left upper leg Body parts bathed by helper: Buttocks, Right lower leg, Left lower leg, Back Assist Level: 2 helpers  Function- Upper Body Dressing/Undressing What is the patient wearing?: Pull over shirt/dress Pull over shirt/dress - Perfomed by patient: Thread/unthread right sleeve, Thread/unthread left sleeve, Put head through opening Pull over shirt/dress - Perfomed by helper: Pull shirt over trunk Assist Level: Touching or steadying assistance(Pt > 75%) Function - Lower Body Dressing/Undressing What is the patient wearing?: Pants, Non-skid slipper socks Position: Wheelchair/chair at sink Pants- Performed by helper: Thread/unthread right pants leg, Thread/unthread left pants leg, Pull pants up/down Non-skid slipper socks- Performed by helper: Don/doff right sock, Don/doff left sock Assist for footwear: Maximal assist Assist for lower body dressing: 2 Helpers  Function - Toileting Toileting steps completed by helper: Adjust clothing prior to toileting, Performs perineal hygiene,  Adjust clothing after toileting Toileting Assistive Devices: Grab bar or rail  Function - Air cabin crew transfer assistive device: Bedside commode, Mechanical lift Mechanical lift: Stedy Assist level to toilet: Maximal assist (Pt 25 - 49%/lift and lower) Assist level to bedside commode (at bedside): 2 helpers Assist level from bedside commode (at bedside): 2 helpers  Function - Chair/bed transfer Chair/bed transfer method: Squat pivot Chair/bed transfer assist level: 2 helpers Chair/bed transfer assistive device: Mechanical lift Mechanical lift: Stedy Chair/bed transfer details: Manual facilitation for weight shifting, Manual facilitation for placement, Verbal cues for safe use of DME/AE, Verbal cues for technique, Verbal cues for sequencing  Function - Locomotion: Wheelchair Will patient use wheelchair at discharge?: Yes Type: Manual Max wheelchair distance: 50 Assist Level: Moderate assistance (Pt 50 - 74%) Assist Level: Moderate assistance (Pt 50 - 74%) Assist Level: Dependent (Pt equals 0%) Turns around,maneuvers to table,bed, and toilet,negotiates 3% grade,maneuvers on rugs and over doorsills: No  Function - Locomotion: Ambulation Assistive device: Parallel bars Max distance: 1' Assist level: Maximal assist (Pt 25 - 49%) Walk 10 feet activity did not occur: Safety/medical concerns Walk 50 feet with 2 turns activity did not occur: Safety/medical concerns Walk 150 feet activity did not occur: Safety/medical concerns Walk 10 feet on uneven surfaces activity did not occur: Safety/medical concerns  Function - Comprehension Comprehension: Auditory Comprehension assist level: Follows complex conversation/direction with extra time/assistive device  Function - Expression Expression: Verbal Expression assist level: Expresses complex ideas: With extra time/assistive device  Function - Social Interaction Social Interaction assist level: Interacts appropriately 90% of the  time - Needs monitoring or encouragement for participation or interaction.  Function - Problem Solving Problem solving assist level: Solves complex 90% of the time/cues < 10% of the time  Function - Memory Memory assist level: Recognizes or recalls 75 - 89% of the time/requires cueing 10 - 24% of the time Patient normally able to recall (first 3 days only): Current season, Staff names and faces, That he or she is in a hospital  Medical Problem List and Plan: 1.  Weakness, gait instability secondary to right internal capsule/thalamic hemorrhage. Left hemiataxia, cont CIR PT, OT    2.  DVT Prophylaxis/Anticoagulation: Pharmaceutical: Lovenox, eventual plan for Eliquis 3. Pain Management: PRN tylenol 4. Mood: LCSW to follow for evaluation and support.  5. Neuropsych: This patient is capable of making decisions on his own behalf. 6. Skin/Wound Care: Routine pressure relief measures.              Will consider santly to left elbow wound.  7. Fluids/Electrolytes/Nutrition: Encourage fluid intake and offer supplements between meals. Monitor I/O. Check lytes in am.  8. JEH:[id number]:-YOVZCHYI ON Norvasc, Lisinopril and Lasix daily. SBP goal<160.  Vitals:   08/04/16 1732 08/05/16 0435  BP: 134/82 (!) 146/89  Pulse: 95 63  Resp: 16 16  Temp: 97.6 F (36.4 C) 97.8 F (36.6 C)    9. FOY:DXAJOIN HR bid--has been controlled.  10. Pre-renal azotemia: Encourage po fluids. Add supplements between meals. Elevated BUN with nl creat   11. Chronic insomnia: sleeps in a recliner at nights. Pt states this is due to nasal congestion will try ocean nasal spray 12. Hyperlipidemia: Pravastatin added this admission.  13.  Left hip pain severe  OA on xray, start tramadol during the day and Tylenol with codeine at noc  LOS (Days) 3 A FACE TO FACE EVALUATION WAS PERFORMED  , E 08/05/2016, 8:12 AM

## 2016-08-06 ENCOUNTER — Inpatient Hospital Stay (HOSPITAL_COMMUNITY): Payer: Medicare Other | Admitting: Physical Therapy

## 2016-08-06 ENCOUNTER — Inpatient Hospital Stay (HOSPITAL_COMMUNITY): Payer: Medicare Other | Admitting: Occupational Therapy

## 2016-08-06 NOTE — Progress Notes (Signed)
Physical Therapy Note  Patient Details  Name: Thomas Burnett MRN: [id number] Date of Birth: 11/15/1919 Today's Date: 08/06/2016   Pt seen for scheduled therapy session at 9767, pt c/o pain in L hip and fatigue from poor night's sleep and busy day.  Declining therapy at this time.  Pt left positioned to comfort in recliner with call bell in reach and needs met.    Roselynn Whitacre E Penven-Crew 08/06/2016, 4:11 PM

## 2016-08-06 NOTE — Progress Notes (Signed)
Physical Therapy Note  Patient Details  Name: Thomas Burnett MRN: [id number] Date of Birth: 1920-07-28 Today's Date: 08/06/2016    Time: 730-745 15 minutes  1:1 No c/o pain. Pt states he is too tired to participate in PT session due to being up and on the toilet already this morning. Pt states " I already stood up too many times".  PT educated pt on importance of mobility for healing and for moving bowels, pt continues to refuse PT at this time. Pt missed 15 minutes of session.   DONAWERTH,KAREN 08/06/2016, 7:54 AM

## 2016-08-06 NOTE — Progress Notes (Signed)
Subjective/Complaints: Hip pain improved on medications, states that he enjoys praying with others ROS- neg CP, SOB, N/V/D, no abd pain Objective: Vital Signs: Blood pressure (!) 134/94, pulse 88, temperature 97 F (36.1 C), temperature source Oral, resp. rate 20, height 5\' 6"  (1.676 m), weight 81.2 kg (179 lb 0.2 oz), SpO2 96 %. Dg Hips Bilat With Pelvis 2v  Result Date: 08/04/2016 CLINICAL DATA:  Hip pain that radiates into left thigh. EXAM: DG HIP (WITH OR WITHOUT PELVIS) 2V BILAT COMPARISON:  CT 01/06/2000. FINDINGS: Degenerative changes lumbar spine and both hips. No acute bony abnormality identified. No evidence of fracture. Coarse pelvic calcification noted consistent with fibroid. Calcified pelvic densities consistent phleboliths noted. Aortoiliac atherosclerotic vascular calcification. Left nephrolithiasis. IMPRESSION: Degenerative changes lumbar spine and both hips. Diffuse osteopenia. No acute bony abnormality identified. 2. Probable calcified fibroid.  Left nephrolithiasis. 3.  Aortoiliac atherosclerotic vascular disease . Electronically Signed   By: Marcello Moores  Register   On: 08/04/2016 10:09   No results found for this or any previous visit (from the past 72 hour(s)).    General: No acute distress Mood and affect are appropriate Heart: Regular rate and rhythm no rubs murmurs or extra sounds, - JVD Lungs: Clear to auscultation, breathing unlabored, no rales or wheezes Abdomen: Positive bowel sounds, soft nontender to palpation, nondistended Extremities: No clubbing, cyanosis, or edema Skin: No evidence of breakdown, no evidence of rash Neurologic: Cranial nerves II through XII intact, motor strength is 5/5 in R deltoid, bicep, tricep, grip, hip flexor, knee extensors, ankle dorsiflexor and plantar flexor, 4/5 on left  Sensory exam normal sensation to light touch  bilateral upper and lower extremities Cerebellar exam normal finger to nose to finger as well as heel to shin in  bilateral upperextremities Musculoskeletal: Pain with left hip abd , int/ext rotation, limited ROM oth hips, no pain to palp over knee, no effusion, no pain with ROM, Pedal edema, but no ankle or foot pain with palp or ROM, neg Homans, neg SLR   Assessment/Plan: 1. 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 Physiatrist is providing close team supervision and 24 hour management of active medical problems listed below. Physiatrist and rehab team continue to assess barriers to discharge/monitor patient progress toward functional and medical goals. FIM: Function - Bathing Position: Wheelchair/chair at sink Body parts bathed by patient: Right arm, Left arm, Chest, Abdomen, Front perineal area, Right upper leg, Left upper leg Body parts bathed by helper: Buttocks, Right lower leg, Left lower leg, Back Assist Level: 2 helpers  Function- Upper Body Dressing/Undressing What is the patient wearing?: Pull over shirt/dress Pull over shirt/dress - Perfomed by patient: Thread/unthread right sleeve, Thread/unthread left sleeve, Put head through opening Pull over shirt/dress - Perfomed by helper: Pull shirt over trunk Assist Level: Touching or steadying assistance(Pt > 75%) Function - Lower Body Dressing/Undressing What is the patient wearing?: Pants, Non-skid slipper socks Position: Wheelchair/chair at sink Pants- Performed by helper: Thread/unthread right pants leg, Thread/unthread left pants leg, Pull pants up/down Non-skid slipper socks- Performed by helper: Don/doff right sock, Don/doff left sock Assist for footwear: Maximal assist Assist for lower body dressing: 2 Helpers  Function - Toileting Toileting steps completed by helper: Adjust clothing prior to toileting, Performs perineal hygiene, Adjust clothing after toileting Toileting Assistive Devices: Grab bar or rail  Function -  Air cabin crew transfer assistive device: Bedside commode, Mechanical lift Mechanical lift: [employer]  Assist level to toilet: Maximal assist (Pt 25 - 49%/lift and lower) Assist level to bedside commode (at bedside): 2 helpers Assist level from bedside commode (at bedside): 2 helpers  Function - Chair/bed transfer Chair/bed transfer method: Squat pivot, Other Chair/bed transfer assist level: 2 helpers (+1 with STEDy, +2 for squat/pivot) Chair/bed transfer assistive device: Mechanical lift Mechanical lift: Stedy Chair/bed transfer details: Manual facilitation for weight shifting, Manual facilitation for placement, Verbal cues for safe use of DME/AE, Verbal cues for technique, Verbal cues for sequencing  Function - Locomotion: Wheelchair Will patient use wheelchair at discharge?: Yes Type: Manual Max wheelchair distance: 50 Assist Level: Moderate assistance (Pt 50 - 74%) Assist Level: Moderate assistance (Pt 50 - 74%) Assist Level: Dependent (Pt equals 0%) Turns around,maneuvers to table,bed, and toilet,negotiates 3% grade,maneuvers on rugs and over doorsills: No Function - Locomotion: Ambulation Assistive device: Clarise Cruz plus Max distance: 10+10 Assist level: 2 helpers Walk 10 feet activity did not occur: Safety/medical concerns Assist level: 2 helpers Walk 50 feet with 2 turns activity did not occur: Safety/medical concerns Walk 150 feet activity did not occur: Safety/medical concerns Walk 10 feet on uneven surfaces activity did not occur: Safety/medical concerns  Function - Comprehension Comprehension: Auditory Comprehension assist level: Follows complex conversation/direction with extra time/assistive device  Function - Expression Expression: Verbal Expression assist level: Expresses complex ideas: With extra time/assistive device  Function - Social Interaction Social Interaction assist level: Interacts appropriately 90% of the time - Needs monitoring or encouragement for  participation or interaction.  Function - Problem Solving Problem solving assist level: Solves complex 90% of the time/cues < 10% of the time  Function - Memory Memory assist level: Recognizes or recalls 75 - 89% of the time/requires cueing 10 - 24% of the time Patient normally able to recall (first 3 days only): Current season, Staff names and faces, That he or she is in a hospital, Location of own room  Medical Problem List and Plan: 1.  Weakness, gait instability secondary to right internal capsule/thalamic hemorrhage. Left hemiataxia, cont CIR PT, OT    2.  DVT Prophylaxis/Anticoagulation: Pharmaceutical: Lovenox, eventual plan for Eliquis 3. Pain Management: PRN tylenol, added tramadol and T#3, aspercreme 4. Mood: LCSW to follow for evaluation and support. Ask Chaplain to visit 5. Neuropsych: This patient is capable of making decisions on his own behalf. 6. Skin/Wound Care: Routine pressure relief measures.              Will consider santly to left elbow wound.  7. Fluids/Electrolytes/Nutrition: Encourage fluid intake and offer supplements between meals. Monitor I/O. Check lytes in am.  8. [id number] ON Norvasc, Lisinopril and Lasix daily. SBP goal<160.  Vitals:   08/06/16 0514 08/06/16 0808  BP: (!) 153/97 (!) 134/94  Pulse: 88   Resp: 20   Temp: 97 F (36.1 C)     9. [id number] HR bid--has been controlled.  10. Pre-renal azotemia: Encourage po fluids. Add supplements between meals. Elevated BUN with nl creat   11. Chronic insomnia: sleeps in a recliner at nights. Pt states this is due to nasal congestion will try ocean nasal spray 12. Hyperlipidemia: Pravastatin added this admission.  13.  Left hip pain severe  OA on xray, start tramadol during the day and Tylenol with codeine at noc  LOS (Days) 4 A FACE TO FACE EVALUATION WAS PERFORMED  [place]ucl[place] Cassetta E 08/06/2016, 8:28 AM

## 2016-08-06 NOTE — Progress Notes (Signed)
Physical Therapy Session Note  Patient Details  Name: Thomas Burnett MRN: [id number] Date of Birth: 1920-03-03  Today's Date: 08/06/2016 PT Individual Time: 1000-1100 PT Individual Time Calculation (min): 60 min    Short Term Goals: Week 1:  PT Short Term Goal 1 (Week 1): Pt will be able to perform functional transfers with max assist PT Short Term Goal 2 (Week 1): Pt will be able to initiate gait with max +2 assist PT Short Term Goal 3 (Week 1): Pt will be able to propel w/c in controlled environment with min assist x 100'  Skilled Therapeutic Interventions/Progress Updates:    Pt resting in recliner on arrival, c/o LLE "giving me a fit" and declining therapy that involves standing/walking/leaving the room.  Agreeable to standing in STEDY to don pants, total assist to thread LEs and to pull up but pt able to pull to stand in STEDY with min assist from recliner and maintain standing in stedy x3 minutes before c/o LLE pain.  Attempted minisquats in stedy but pt unable to perform 2/2 pain.  Pt returned to recliner, moist heat applied to L hip and manual therapy/distraction to LLE 10 reps of 10-15 second holds.  Pt reports improvement in pain with heat/manual therapy.  PT provided ongoing education regarding pt's attention during therapy as he continues to be distracted by his talking.  Pt verbalized understanding.  Met with wife outside room to discuss pt's progress and goals of therapy.  Wife states they already have a ramp that pt has previously refused to use, that he was using RW prior to CVA, and that he was already in the habit of pulling up to stand.  Pt left in recliner with call bell in reach and needs met at end of session.    Therapy Documentation Precautions:  Precautions Precautions: Fall Precaution Comments: Lt hemiparesis and ataxia Restrictions Weight Bearing Restrictions: No   See Function Navigator for Current Functional Status.   Therapy/Group: Individual  Therapy  Dionte Blaustein E Penven-Crew 08/06/2016, 11:44 AM

## 2016-08-06 NOTE — Progress Notes (Addendum)
Occupational Therapy Session Note  Patient Details  Name: Thomas Burnett MRN: [id number] Date of Birth: 04-04-1920  Today's Date: 08/06/2016 OT Individual Time: 0850-1000 and 1300-1330 (30 min makeup time) OT Individual Time Calculation (min): 70 min and 30 min    Short Term Goals: Week 1:  OT Short Term Goal 1 (Week 1): Pt will complete LB dressing with max assist of one caregiver OT Short Term Goal 2 (Week 1): Pt will complete bathing with mod assist of one caregiver OT Short Term Goal 3 (Week 1): Pt will complete squat pivot transfer to toilet with max assist of one caregiver OT Short Term Goal 4 (Week 1): Pt will complet sit > stand with max assist of one caregiver to decrease burden of care with self-care tasks  Skilled Therapeutic Interventions/Progress Updates:    1) Treatment session with focus on functional use of LUE with reaching and motor control.  Upon arrival pt received in recliner reporting too fatigued to engage in any standing or strenuous activity but wanting to work on his [name].  Engaged in visual-cognitive task with replicating design on picture card with pegs in resistive board.  Pt demonstrated ability to follow pattern without assist, but needing increased time and cues to visually attend to [name] to increase coordination.  Incorporated reaching across midline for gross motor control as well as fine motor with placing pegs into holes close together.  Pt required increased time due to impaired sensation and proprioception and difficulty maintaining grasp.  Engaged in sit >stand with Stedy for pressure relief as pt had been sitting for extended period of time.  Min assist sit > stand while pulling up on Stedy bar.  Pt removed pegs from pegboard while in partial standing in [name] with use of LUE, continuing to demonstrate ataxia and decreased motor control.  Left seated in recliner with hired caregiver present awaiting PT.  2) Pt seen for make up time due to prior  missed minutes.  Engaged in [name] in sitting with focus on strength and motor control.  Provided pt with stress ball and handout with focus on finger adduction, flexion, extension, pinch/3 jaw chuck, and thumb opposition.  Pt required mod multimodal cues to successfully complete each exercise.  Wife present at end of session and discussed focus on increased motor control and coordination as well as encouraged her to assist him in completing exercises during down time.  Therapy Documentation Precautions:  Precautions Precautions: Fall Precaution Comments: Lt hemiparesis and ataxia Restrictions Weight Bearing Restrictions: No General:   Vital Signs:  Pain: Pain Assessment Pain Score: 4   See Function Navigator for Current Functional Status.   Therapy/Group: Individual Therapy  Simonne Come 08/06/2016, 12:10 PM

## 2016-08-07 ENCOUNTER — Inpatient Hospital Stay (HOSPITAL_COMMUNITY): Payer: Medicare Other | Admitting: Physical Therapy

## 2016-08-07 ENCOUNTER — Inpatient Hospital Stay (HOSPITAL_COMMUNITY): Payer: Medicare Other | Admitting: Occupational Therapy

## 2016-08-07 NOTE — Progress Notes (Signed)
Occupational Therapy Session Note  Patient Details  Name: Thomas Burnett MRN: [id number] Date of Birth: 1919/10/30  Today's Date: 08/07/2016 OT Individual Time: [id number] OT Individual Time Calculation (min): 15 min  and Today's Date: 08/07/2016 OT Missed Time: 60 Minutes Missed Time Reason: Pain     Short Term Goals: Week 1:  OT Short Term Goal 1 (Week 1): Pt will complete LB dressing with max assist of one caregiver OT Short Term Goal 2 (Week 1): Pt will complete bathing with mod assist of one caregiver OT Short Term Goal 3 (Week 1): Pt will complete squat pivot transfer to toilet with max assist of one caregiver OT Short Term Goal 4 (Week 1): Pt will complet sit > stand with max assist of one caregiver to decrease burden of care with self-care tasks  Skilled Therapeutic Interventions/Progress Updates:    Pt with c/o pain in Lt calf and reporting not wanting to engage in any activity until speaking with MD.  Applied moist heat to Lt calf as pt reporting greatest pain in this area.  Notified RN of complaints and pt reporting not wanting to do any activity until speaking with MD.  Will return as schedule allows.   Returned 1 hour later to which pt stating that he is still in pain and has spoken to the doctor.  Pt stating "I am not doing anything until my pain is gone."  Encouraged participation and educated on purpose of OT and therapies to which pt continues to refuse.  Therapy Documentation Precautions:  Precautions Precautions: Fall Precaution Comments: Lt hemiparesis and ataxia Restrictions Weight Bearing Restrictions: No General: General OT Amount of Missed Time: 60 Minutes Vital Signs:  Pain: Pain Assessment Pain Assessment: Faces Faces Pain Scale: Hurts whole lot Pain Type: Chronic pain Pain Location: Leg Pain Orientation: Left;Lower Pain Frequency: Constant Pain Onset:  (pt states his left calf is hurting)  See Function Navigator for Current Functional  Status.   Therapy/Group: Individual Therapy  Simonne Come 08/07/2016, 10:07 AM

## 2016-08-07 NOTE — Progress Notes (Signed)
Patient ID: Thomas Burnett, male   DOB: 01/09/1920, 82 y.o.   MRN: [id number]   08/07/16.  82 year old patient admitted for CIR with Weakness, gait instability secondary to right internal capsule/thalamic hemorrhage.  Subjective/Complaints: Hip pain improved on medications, states that he continues to have some left calf pain ROS- neg CP, SOB, N/V/D, no abd pain  Past Medical History:  Diagnosis Date  . Hypertension   . Mild aortic insufficiency   . Mild aortic sclerosis ([place])   . Obesity   . Osteopenia   . Prostate cancer ([place])    STAGE 2  . Right bundle branch block (RBBB)   . Vitamin D deficiency    BP Readings from Last 3 Encounters:  08/07/16 (!) 123/92  08/02/16 137/70   Objective: Vital Signs: Blood pressure (!) 123/92, pulse 83, temperature 98.6 F (37 C), temperature source Oral, resp. rate 18, height 5\' 6"  (1.676 m), weight 179 lb 0.2 oz (81.2 kg), SpO2 96 %. No results found. No results found for this or any previous visit (from the past 72 hour(s)).    General: No acute distress Mood and affect are appropriate Heart: Irregular rate and rhythm no rubs murmurs or extra sounds, - JVD Lungs: Clear to auscultation, breathing unlabored, no rales or wheezes Abdomen: Positive bowel sounds, soft nontender to palpation, nondistended Extremities: No clubbing, cyanosis, or edema; heating pad applied to the left lower leg Skin: No evidence of breakdown, no evidence of rash Neurologic: Cranial nerves II through XII intact, motor strength is 5/5 in R deltoid, bicep, tricep, grip, hip flexor, knee extensors, ankle dorsiflexor and plantar flexor, 4/5 on left   Musculoskeletal: Pain with left hip abd , int/ext rotation, limited ROM      Medical Problem List and Plan: 1.  Weakness, gait instability secondary to right internal capsule/thalamic hemorrhage. Left hemiataxia, cont CIR PT, OT     2.  DVT Prophylaxis/Anticoagulation: Pharmaceutical: Lovenox, eventual plan for  Eliquis 3. Pain Management: PRN tylenol, added tramadol and T#3, aspercreme  4. [id number] ON Norvasc, Lisinopril and Lasix daily. SBP goal<160.  Vitals:   08/06/16 1423 08/07/16 0447  BP: (!) 90/59 (!) 123/92  Pulse: (!) 104 83  Resp: 18 18  Temp: 97.9 F (36.6 C) 98.6 F (37 C)    5. [id number] HR bid--has been controlled.   6.  Left hip pain severe  OA on xray, start tramadol during the day and Tylenol with codeine at noc  LOS (Days) 5 A FACE TO FACE EVALUATION WAS PERFORMED  Nyoka Cowden 08/07/2016, 1:07 PM

## 2016-08-07 NOTE — Progress Notes (Signed)
Physical Therapy Session Note  Patient Details  Name: Thomas Burnett MRN: [id number] Date of Birth: 04/02/1920  Today's Date: 08/07/2016 PT Individual Time: 1400-1526 PT Individual Time Calculation (min): 86 min    Short Term Goals: Week 1:  PT Short Term Goal 1 (Week 1): Pt will be able to perform functional transfers with max assist PT Short Term Goal 2 (Week 1): Pt will be able to initiate gait with max +2 assist PT Short Term Goal 3 (Week 1): Pt will be able to propel w/c in controlled environment with min assist x 100'  Skilled Therapeutic Interventions/Progress Updates:    Pt resting in recliner on arrival, states his LLE pain is improved from this morning, agreeable to therapy session.    Sit>stand from recliner into stedy with min assist when pt pulls up, but pt requires mod assist to control descent into w/c.  Squat/pivot transfer w/c>therapy mat with max +1 assist to the R side and to scoot back onto mat.  Squat/pivot back to chair at end of session requiring max assist +1 to achieve squat position but +2 to pivot due to pt fatigue.   Pt engaged in static and dynamic sitting balance activities with no, single, and bilateral UE support focus on trunk control and engaging core.  Pt able to sit statically without UE support x75 seconds with supervision and min verbal cues to bring trunk forward.  Dynamic sitting balance focus on abdominal activation to come forward from reclined position for reaching activity.  Pt requires initial min assist fade to close supervision for balance.  Dynamic sitting balance focus on L trunk righting reaction/strengthening with R lateral lean and returning to midline with initial min assist, fade to supervision, and progress to R lateral lean>posterior lean>midline to engage whole core sequentially.    Sit<>stand x2 trials from slightly elevated therapy mat with verbal cues to push from mat and min assist to rise but mod assist for LUE placement and to  control descent when sitting.  Pt able to tolerate standing x1 minute each trial with verbal cues for L quad activation.  Pt unable to shift weight to LLE without buckling in order to widen stance.     Pt requesting to toilet when returning to room.  Max assist with use of STEDY to get to BSC.  Pt required total assist for clothing management and hygiene.  Pt returned to recliner at end of session and left with call bell in reach and needs met.   Therapy Documentation Precautions:  Precautions Precautions: Fall Precaution Comments: Lt hemiparesis and ataxia Restrictions Weight Bearing Restrictions: No   See Function Navigator for Current Functional Status.   Therapy/Group: Individual Therapy   E Penven-Crew 08/07/2016, 4:23 PM  

## 2016-08-08 ENCOUNTER — Inpatient Hospital Stay (HOSPITAL_COMMUNITY): Payer: Medicare Other | Admitting: Physical Therapy

## 2016-08-08 ENCOUNTER — Inpatient Hospital Stay (HOSPITAL_COMMUNITY): Payer: Medicare Other

## 2016-08-08 NOTE — Progress Notes (Signed)
Physical Therapy Session Note  Patient Details  Name: Thomas Burnett MRN: [id number] Date of Birth: 10/18/1919  Today's Date: 08/08/2016 PT Individual Time: 1413-1500 PT Individual Time Calculation (min): 47 min    Short Term Goals: Week 1:  PT Short Term Goal 1 (Week 1): Pt will be able to perform functional transfers with max assist PT Short Term Goal 2 (Week 1): Pt will be able to initiate gait with max +2 assist PT Short Term Goal 3 (Week 1): Pt will be able to propel w/c in controlled environment with min assist x 100'  Skilled Therapeutic Interventions/Progress Updates:    Pt received in recliner & noting significant fatigue. Pt requesting to skip therapy session to allow him to rest but pt later agreeable to hand exercises while sitting in chair. Pt denied c/o pain. Pt retrieved beads from soft yellow therapy putty focusing on LUE NMR and fine motor control; pt with significant LUE ataxia limiting his ability to place beads in container. Pt required significantly extra time to find all beads. Pt reported need to urinate & required assistance for retrieval & placement of urinal with small, continent void. Pt agreeable to attempt standing & required +2 to transfer sit<>stand from recliner with therapist blocking L knee and facilitating upright posture. Pt with single UE support and tolerated standing ~60 seconds for increased weight bearing through LLE. Pt noted L bursitis pain with some activity during session. At end of session pt left sitting in recliner in handoff to OT.  Pt requires constant redirection during session as pt attempts to engage in excessive conversation.   Therapy Documentation Precautions:  Precautions Precautions: Fall Precaution Comments: Lt hemiparesis and ataxia Restrictions Weight Bearing Restrictions: No   General: PT Amount of Missed Time (min): 13 Minutes PT Missed Treatment Reason: Other (Comment) (therapist arrived late due to previous session  requiring unexpected extra time)   See Function Navigator for Current Functional Status.   Therapy/Group: Individual Therapy  Waunita Schooner 08/08/2016, 5:36 PM

## 2016-08-08 NOTE — Progress Notes (Signed)
Occupational Therapy Session Note  Patient Details  Name: Thomas Burnett MRN: [id number] Date of Birth: 01/12/1920  Today's Date: 08/08/2016 OT Individual Time: 0900-1000 OT Individual Time Calculation (min): 60 min   Short Term Goals: Week 1:  OT Short Term Goal 1 (Week 1): Thomas Burnett will complete LB dressing with max assist of one caregiver OT Short Term Goal 2 (Week 1): Thomas Burnett will complete bathing with mod assist of one caregiver OT Short Term Goal 3 (Week 1): Thomas Burnett will complete squat pivot transfer to toilet with max assist of one caregiver OT Short Term Goal 4 (Week 1): Thomas Burnett will complet sit > stand with max assist of one caregiver to decrease burden of care with self-care tasks  Skilled Therapeutic Interventions/Progress Updates:  ADL-retraining with focus on re-ed on goals and methods of treatment, improved static standing balance, sit<>stand using Stedy lift, toileting and improved active participation in self-care.   Thomas Burnett received seated in recliner with cushion placed for comfort.   With max redirection and extra time, Thomas Burnett was receptive for planned squat-pivot transfer training from recliner to bed and bed to w/c however during setup Thomas Burnett reported immediate need to void BM.   Treatment planned altered to return to use of Stedy to assist to [facility]   Thomas Burnett able to rise from recliner with manual facilitation for hand placement and moderate assist, +2 help, using Stedy lift.  Thomas Burnett sustained static standing balance and supported standing with only contact guard during transport using Stedy.   Thomas Burnett required extra time to void large BM but participated in seated and assisted bathing/dressing while seated on BSC to expedite BADL as Thomas Burnett was delayed with progression of BM.    Thomas Burnett was able to bathe chest, periarea and upper legs and wash his face unassisted after setup.   Thomas Burnett required re-ed on adapted dressing skills but donned his shirt with mod assist.   Following BM, Thomas Burnett required total assist for hygiene and clothing management  to return to his recliner at end of session using Stedy lift.   Therapy Documentation Precautions:  Precautions Precautions: Fall Precaution Comments: Lt hemiparesis and ataxia Restrictions Weight Bearing Restrictions: No   Pain: Pain Assessment Pain Assessment: 0-10 Pain Score: 5  Pain Type: Acute pain Pain Location: Shoulder Pain Orientation: Left;Upper Pain Descriptors / Indicators: Jabbing Pain Intervention(s): Repositioned   See Function Navigator for Current Functional Status.   Therapy/Group: Individual Therapy   Second session: Time: [id number] Time Calculation (min):  30 min  Pain Assessment: No/denies pain  Skilled Therapeutic Interventions: ADL-retraining with focus on improved [place] of left hand during assisted dressing and seated toileting using urinal.   Thomas Burnett received seated in recliner from physical therapist.  Thomas Burnett requested assist with clothing and completed upper body dressing to don button up pajama top.   Thomas Burnett required mod assist to reposition in recliner to enhance performance with dressing and correct right lateral and anterior lean.  With extra time, Thomas Burnett donned left sleeve independently but required assist to bring shirt around his back and lace right arm.   Thomas Burnett then buttoned one button before reporting need to toilet using urinal.   Thomas Burnett required assist to hold urinal initially to prevent spill but retained urinal and completed toileting, approx 125 ml.   Thomas Burnett required setup for placement of peri cleaner and supplies for hygiene with no spill evident during toileting.   Thomas Burnett assist with setup at end of session within reach of his right hand; all needs placed within reach as  requested.  See FIM for current functional status  Therapy/Group: Individual Therapy  Hatsuko Bizzarro 08/08/2016, 12:52 PM

## 2016-08-08 NOTE — Progress Notes (Signed)
   Patient ID: Thomas Burnett, male   DOB: 1920-08-10, 82 y.o.   MRN: [id number]   08/08/16.  82 year old patient admitted for CIR with Weakness, gait instability secondary to right internal capsule/thalamic hemorrhage.  Subjective/Complaints:  Doing much better this morning.  His left leg pain has a largely  Resolved. "Lonely". ROS- neg CP, SOB, N/V/D, no abd pain  Past Medical History:  Diagnosis Date  . Hypertension   . Mild aortic insufficiency   . Mild aortic sclerosis ([place])   . Obesity   . Osteopenia   . Prostate cancer ([place])    STAGE 2  . Right bundle branch block (RBBB)   . Vitamin D deficiency    BP Readings from Last 3 Encounters:  08/08/16 108/79  08/02/16 137/70   Objective: Vital Signs: Blood pressure 108/79, pulse 85, temperature 98.3 F (36.8 C), temperature source Oral, resp. rate 16, height 5\' 6"  (1.676 m), weight 179 lb 0.2 oz (81.2 kg), SpO2 98 %. No results found. No results found for this or any previous visit (from the past 72 hour(s)).    General: No acute distress Mood and affect are appropriate Heart: Irregular rate and rhythm no rubs murmurs or extra sounds, - JVD Lungs: Clear to auscultation, breathing unlabored, no rales or wheezes Abdomen: Positive bowel sounds, soft nontender to palpation, nondistended Extremities: No clubbing, cyanosis, or edema; heating pad applied to the left lower leg Skin: No evidence of breakdown, no evidence of rash Neurologic: Cranial nerves II through XII intact, motor strength is 5/5 in R deltoid, bicep, tricep, grip, hip flexor, knee extensors, ankle dorsiflexor and plantar flexor, 4/5 on left   Musculoskeletal: No swelling or pain to palpation involving the left leg     Medical Problem List and Plan: 1.  Weakness, gait instability secondary to right internal capsule/thalamic hemorrhage. Left hemiataxia, cont CIR PT, OT     2.  DVT Prophylaxis/Anticoagulation: Pharmaceutical: Lovenox, eventual plan for  Eliquis 3. Pain Management: PRN tylenol, added tramadol and T#3, aspercreme  4. [id number] ON Norvasc, Lisinopril and Lasix daily. SBP goal<160.  Vitals:   08/07/16 1412 08/08/16 0500  BP: 126/68 108/79  Pulse: 84 85  Resp: 17 16  Temp: 98.6 F (37 C) 98.3 F (36.8 C)    5. [id number] HR bid--has been controlled.   6.  Left hip pain severe  OA on xray, start tramadol during the day and Tylenol with codeine at noc.  Presently nicely improved  LOS (Days) 6 A FACE TO FACE EVALUATION WAS PERFORMED  Nyoka Cowden 08/08/2016, 11:36 AM

## 2016-08-09 ENCOUNTER — Inpatient Hospital Stay (HOSPITAL_COMMUNITY): Payer: Medicare Other | Admitting: Occupational Therapy

## 2016-08-09 ENCOUNTER — Inpatient Hospital Stay (HOSPITAL_COMMUNITY): Payer: Medicare Other | Admitting: Physical Therapy

## 2016-08-09 LAB — BASIC METABOLIC PANEL
ANION GAP: 10 (ref 5–15)
BUN: 26 mg/dL — ABNORMAL HIGH (ref 6–20)
CHLORIDE: 102 mmol/L (ref 101–111)
CO2: 26 mmol/L (ref 22–32)
Calcium: 9.3 mg/dL (ref 8.9–10.3)
Creatinine, Ser: 1.54 mg/dL — ABNORMAL HIGH (ref 0.61–1.24)
GFR calc non Af Amer: 37 mL/min — ABNORMAL LOW (ref >60)
GFR, EST AFRICAN AMERICAN: 43 mL/min — AB (ref >60)
Glucose, Bld: 121 mg/dL — ABNORMAL HIGH (ref 65–99)
POTASSIUM: 4.3 mmol/L (ref 3.5–5.1)
Sodium: 138 mmol/L (ref 135–145)

## 2016-08-09 MED ORDER — MUSCLE RUB 10-15 % EX CREA
TOPICAL_CREAM | CUTANEOUS | Status: DC | PRN
  Administered 2016-08-11 – 2016-08-12 (×4): via TOPICAL
  Administered 2016-08-15: 1 via TOPICAL
  Administered 2016-08-16: 03:00:00 via TOPICAL
  Filled 2016-08-09: qty 85

## 2016-08-09 NOTE — Progress Notes (Signed)
Subjective/Complaints: Requesting analgesic cream prn C/o of being awakened for blood draw ROS- neg CP, SOB, N/V/D, no abd pain Objective: Vital Signs: Blood pressure (!) 138/94, pulse (!) 102, temperature 97.6 F (36.4 C), temperature source Oral, resp. rate 16, height 5' 6"  (1.676 m), weight 81.2 kg (179 lb 0.2 oz), SpO2 100 %. No results found. Results for orders placed or performed during the hospital encounter of 08/02/16 (from the past 72 hour(s))  Basic metabolic panel     Status: Abnormal   Collection Time: 08/09/16  5:25 AM  Result Value Ref Range   Sodium 138 135 - 145 mmol/L   Potassium 4.3 3.5 - 5.1 mmol/L   Chloride 102 101 - 111 mmol/L   CO2 26 22 - 32 mmol/L   Glucose, Bld 121 (H) 65 - 99 mg/dL   BUN 26 (H) 6 - 20 mg/dL   Creatinine, Ser 1.54 (H) 0.61 - 1.24 mg/dL   Calcium 9.3 8.9 - 10.3 mg/dL   GFR calc non Af Amer 37 (L) >60 mL/min   GFR calc Af Amer 43 (L) >60 mL/min    Comment: (NOTE) The eGFR has been calculated using the CKD EPI equation. This calculation has not been validated in all clinical situations. eGFR's persistently <60 mL/min signify possible Chronic Kidney Disease.    Anion gap 10 5 - 15      General: No acute distress Mood and affect are appropriate Heart: Regular rate and rhythm no rubs murmurs or extra sounds, - JVD Lungs: Clear to auscultation, breathing unlabored, no rales or wheezes Abdomen: Positive bowel sounds, soft nontender to palpation, nondistended Extremities: No clubbing, cyanosis, or edema Skin: No evidence of breakdown, no evidence of rash Neurologic: Cranial nerves II through XII intact, motor strength is 5/5 in R deltoid, bicep, tricep, grip, hip flexor, knee extensors, ankle dorsiflexor and plantar flexor, 4/5 on left  Sensory exam normal sensation to light touch  bilateral upper and lower extremities Cerebellar exam normal finger to nose to finger as well as heel to shin in bilateral upperextremities Musculoskeletal:  Pain with left hip abd , int/ext rotation, limited ROM oth hips, no pain to palp over knee, no effusion, no pain with ROM, Pedal edema, but no ankle or foot pain with palp or ROM, neg Homans, neg SLR   Assessment/Plan: 1. 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 Physiatrist is providing close team supervision and 24 hour management of active medical problems listed below. Physiatrist and rehab team continue to assess barriers to discharge/monitor patient progress toward functional and medical goals. FIM: Function - Bathing Position: Other (comment) (while sitting on BSC) Body parts bathed by patient: Chest, Abdomen, Front perineal area, Right upper leg, Right lower leg Body parts bathed by helper: Right arm, Left arm, Buttocks, Left lower leg, Left upper leg, Back Assist Level:  (Max assist)  Function- Upper Body Dressing/Undressing What is the patient wearing?: Pull over shirt/dress Pull over shirt/dress - Perfomed by patient: Thread/unthread right sleeve, Thread/unthread left sleeve Pull over shirt/dress - Perfomed by helper: Put head through opening, Pull shirt over trunk Assist Level:  (Mod assist) Function - Lower Body Dressing/Undressing What is the patient wearing?: Pants, Non-skid slipper socks Position:  (BSC using Stedy) Pants- Performed by patient: Pull pants up/down Pants- Performed by helper: Thread/unthread right pants leg, Thread/unthread left pants leg, Pull pants up/down, Fasten/unfasten pants Non-skid slipper socks- Performed by helper: Don/doff  right sock, Don/doff left sock Assist for footwear: Maximal assist Assist for lower body dressing:  (Max assist)  Function - Toileting Toileting steps completed by helper: Adjust clothing prior to toileting, Performs perineal hygiene, Adjust clothing after toileting Toileting Assistive Devices:  (stedy) Assist  level:  (Total assist)  Function - Toilet Transfers Toilet transfer assistive device: Bedside commode, Mechanical lift Mechanical lift: Stedy Assist level to toilet: Maximal assist (Pt 25 - 49%/lift and lower) Assist level from toilet: Maximal assist (Pt 25 - 49%/lift and lower) Assist level to bedside commode (at bedside): 2 helpers Assist level from bedside commode (at bedside): 2 helpers  Function - Chair/bed transfer Chair/bed transfer method: Squat pivot Chair/bed transfer assist level: 2 helpers Chair/bed transfer assistive device: Armrests Mechanical lift: Stedy Chair/bed transfer details: Manual facilitation for weight shifting, Manual facilitation for placement, Verbal cues for safe use of DME/AE, Verbal cues for technique, Verbal cues for sequencing  Function - Locomotion: Wheelchair Will patient use wheelchair at discharge?: Yes Type: Manual Max wheelchair distance: 50 Assist Level: Moderate assistance (Pt 50 - 74%) Assist Level: Moderate assistance (Pt 50 - 74%) Assist Level: Dependent (Pt equals 0%) Turns around,maneuvers to table,bed, and toilet,negotiates 3% grade,maneuvers on rugs and over doorsills: No Function - Locomotion: Ambulation Ambulation activity did not occur: Safety/medical concerns (per report) Assistive device: Clarise Cruz plus Max distance: 10+10 Assist level: 2 helpers Walk 10 feet activity did not occur: Safety/medical concerns Assist level: 2 helpers Walk 50 feet with 2 turns activity did not occur: Safety/medical concerns Walk 150 feet activity did not occur: Safety/medical concerns Walk 10 feet on uneven surfaces activity did not occur: Safety/medical concerns  Function - Comprehension Comprehension: Auditory Comprehension assist level: Follows complex conversation/direction with extra time/assistive device  Function - Expression Expression: Verbal Expression assist level: Expresses complex ideas: With extra time/assistive device  Function -  Social Interaction Social Interaction assist level: Interacts appropriately 90% of the time - Needs monitoring or encouragement for participation or interaction.  Function - Problem Solving Problem solving assist level: Solves basic 90% of the time/requires cueing < 10% of the time  Function - Memory Memory assist level: Recognizes or recalls 75 - 89% of the time/requires cueing 10 - 24% of the time Patient normally able to recall (first 3 days only): Current season, That he or she is in a hospital, Staff names and faces  Medical Problem List and Plan: 1.  Weakness, gait instability secondary to right internal capsule/thalamic hemorrhage. Left hemiataxia, cont CIR PT, OT    2.  DVT Prophylaxis/Anticoagulation: Pharmaceutical: Lovenox, eventual plan for Eliquis 3. Pain Management: PRN tylenol, added tramadol and T#3, aspercreme 4. Mood: LCSW to follow for evaluation and support. Ask Chaplain to visit 5. Neuropsych: This patient is capable of making decisions on his own behalf. 6. Skin/Wound Care: Routine pressure relief measures.              Will consider santly to left elbow wound.  7. Fluids/Electrolytes/Nutrition: Encourage fluid intake and offer supplements between meals. Monitor I/O. Check lytes in am.  8. ZOX:[id number]:-[id number] ON Norvasc, Lisinopril and Lasix daily. SBP goal<160.  Vitals:   08/09/16 0432 08/09/16 0807  BP: 126/82 (!) 138/94  Pulse: 76 (!) 102  Resp: 16   Temp: 97.6 F (36.4 C)     9. NWG:[id number] HR bid--has been controlled.  10. Pre-renal azotemia: Encourage po fluids. Add supplements between meals. Elevated BUN and creat  Stop prinivil and lasix , recheck BMET later in week  11.  Chronic insomnia: sleeps in a recliner at nights. Pt states this is due to nasal congestion will try ocean nasal spray 12. Hyperlipidemia: Pravastatin added this admission.  13.  Left hip pain severe  OA on xray, start tramadol during the day and Tylenol with codeine at noc, prn muscle  rub  LOS (Days) 7 A FACE TO FACE EVALUATION WAS PERFORMED  Thomas Burnett 08/09/2016, 8:17 AM

## 2016-08-09 NOTE — Plan of Care (Signed)
Problem: RH Stairs Goal: LTG Patient will ambulate up and down stairs w/assist (PT) LTG: Patient will ambulate up and down # of stairs with assistance (PT)  Outcome: Not Applicable Date Met: 22/57/50 Pt will not negotiate steps at d/c, ramp being installed

## 2016-08-09 NOTE — Plan of Care (Signed)
Problem: RH PAIN MANAGEMENT Goal: RH STG PAIN MANAGED AT OR BELOW PT'S PAIN GOAL Less than 3,on 1 to 10 scale.  Outcome: Not Progressing Pt rates pain greater than 3

## 2016-08-09 NOTE — Progress Notes (Signed)
Occupational Therapy Session Note  Patient Details  Name: Thomas Burnett MRN: [id number] Date of Birth: 20-Feb-1920  Today's Date: 08/09/2016 OT Individual Time: [id number] OT Individual Time Calculation (min): 75 min     Short Term Goals: Week 1:  OT Short Term Goal 1 (Week 1): Pt will complete LB dressing with max assist of one caregiver OT Short Term Goal 2 (Week 1): Pt will complete bathing with mod assist of one caregiver OT Short Term Goal 3 (Week 1): Pt will complete squat pivot transfer to toilet with max assist of one caregiver OT Short Term Goal 4 (Week 1): Pt will complet sit > stand with max assist of one caregiver to decrease burden of care with self-care tasks  Skilled Therapeutic Interventions/Progress Updates:    Treatment session with focus on functional use of LUE with reaching and motor control.  Pt received in recliner upon arrival reporting too fatigue to engage in any standing and attempting to refuse therapy.  Able to encourage pt to participate in trunk control and LUE NMR.  Pt reports pain in buttocks, educated on need for movement and pressure relief as pt very sedentary.  Engaged in sit > stand with use of Stedy, with pt pulling up into standing with increased time for hand placement and tactile cues from therapist for anterior weight shift.  Maintained partial stand in [place] for 3-4 mins then returned to sitting.  Pt with decreased trunk control, often leaning towards Rt requiring multimodal cues and physical assist to correct.  Challenged LUE motor control with pt reaching around cups to pick up checkers for gross and fine motor control.  Pt often purposefully moving cups out of the way, despite cues to leave them for added challenge.  Pt dropped approx 50% of checkers when attempting to pick them up and move them to target with [name].  Provided cues to visually attend to LUE to increase sensation, proprioception, and motor control.  Left upright in recliner with all  needs in reach.  Therapy Documentation Precautions:  Precautions Precautions: Fall Precaution Comments: Lt hemiparesis and ataxia Restrictions Weight Bearing Restrictions: No Pain:  Pt with c/o pain in Lt hip/thigh area.  Applied analgesic cream and repositioned.  See Function Navigator for Current Functional Status.   Therapy/Group: Individual Therapy  Simonne Come 08/09/2016, 12:11 PM

## 2016-08-09 NOTE — Progress Notes (Signed)
Physical Therapy Weekly Progress Note  Patient Details  Name: Thomas Burnett MRN: [id number] Date of Birth: 1920/05/26  Beginning of progress report period: August 03, 2016 End of progress report period: August 09, 2016  Today's Date: 08/09/2016 PT Individual Time: 1105-1200 PT Individual Time Calculation (min): 55 min    Patient has met 1 of 3 short term goals.  Pt has been limited in therapy participation and progress over the last week 2/2 ongoing severe OA pain in LLE as well as pt's tendency to be excessively verbose with difficulty being re-directed.  Pt continues to require +2 assist for all mobility and transfers, though is able to transfer with use of STEDY lift with +1 assist.  Therapist has initiated discussion with pt and pt's family regarding likely level of care required at d/c as well as initiating discussions regarding need for ramp, w/c, and possible lift equipment at home.    Patient not progressing toward long term goals.  See goal revision..  Plan of care revisions: d/c stair goal, downgraded w/c goals, added attention goal.  PT Short Term Goals Week 1:  PT Short Term Goal 1 (Week 1): Pt will be able to perform functional transfers with max assist PT Short Term Goal 1 - Progress (Week 1): Not met PT Short Term Goal 2 (Week 1): Pt will be able to initiate gait with max +2 assist PT Short Term Goal 2 - Progress (Week 1): Met PT Short Term Goal 3 (Week 1): Pt will be able to propel w/c in controlled environment with min assist x 100' PT Short Term Goal 3 - Progress (Week 1): Not met Week 2:  PT Short Term Goal 1 (Week 2): Pt will transfer with +1 assist using LRAD PT Short Term Goal 2 (Week 2): Pt will propel w/c 50' using R hemi-technique PT Short Term Goal 3 (Week 2): Pt will demonstrate alternating attention x5 minutes with min cues   Skilled Therapeutic Interventions/Progress Updates:    Pt resting in recliner on arrival, c/o ongoing pain in LLE, agreeable to  therapy session.  Therapist donned pt's shorts for time management, total assist for clothing management, mod assist for sit<>stand from recliner into stedy.  Stedy used for transfer from recliner>w/c.  Sit<>stand x2 from w/c with +2 assist to achieve forward weight shift and for lifting assist.  Therapist engaged pt in discussion regarding d/c equipment recommendations and concerns for wife/Home Instead to provide level of care pt will likely need at d/c.  Pt reports that he has enough money to buy anything he needs including lift equipment and aids.  Also discussed with wife once returned to room and made initial recommendations for installation of ramp, use of w/c for mobility, and potential need for use of lift equipment for transfers.  Pt's wife did not say much in regards to these recommendations.  Pt continues to limit progress in sessions with verbosity and becomes somewhat agitated with attempts by staff to redirect him.  Pt returned to room at end of session and left positioned upright in w/c with call bell in reach and needs met.   Therapy Documentation Precautions:  Precautions Precautions: Fall Precaution Comments: Lt hemiparesis and ataxia Restrictions Weight Bearing Restrictions: No   See Function Navigator for Current Functional Status.  Therapy/Group: Individual Therapy  Suzzette Gasparro E Penven-Crew 08/09/2016, 12:08 PM

## 2016-08-09 NOTE — Progress Notes (Signed)
Social Work Patient ID: Thomas Burnett, male   DOB: [birth year], 82 y.o.   MRN: [id number]  Met with wife to discuss her concerns regarding husband's care at discharge. She feels he will need to go to a NH then home.  She is also pursuing Page. She realizes this may take some time but will begin now. She is also discussing with his children. Aware she and family will need To talk with pt and this worker will also. She and family to talk with pt about going to another facility at discharge.

## 2016-08-09 NOTE — Progress Notes (Signed)
Occupational Therapy Session Note  Patient Details  Name: Thomas Burnett MRN: [id number] Date of Birth: September 30, 1920  Today's Date: 08/09/2016 OT Individual Time: 1300-1345 OT Individual Time Calculation (min): 45 min     Short Term Goals: Week 1:  OT Short Term Goal 1 (Week 1): Pt will complete LB dressing with max assist of one caregiver OT Short Term Goal 2 (Week 1): Pt will complete bathing with mod assist of one caregiver OT Short Term Goal 3 (Week 1): Pt will complete squat pivot transfer to toilet with max assist of one caregiver OT Short Term Goal 4 (Week 1): Pt will complet sit > stand with max assist of one caregiver to decrease burden of care with self-care tasks  Skilled Therapeutic Interventions/Progress Updates:    Treatment session with focus on trunk control and sit <> stand.  Pt received in w/c finishing lunch.  Pt reports feeling better than AM session and willing to participate in treatment session.  Performed sit <> stand x3 from w/c in therapy gym with focus on anterior weight shift.  Therapist placed in front of pt in arm chair to facilitate anterior weight shift.  Pt placed hands on arm rests of therapist's chair for weight shift with min tactile cues from therapist to facilitate weight shift.  Pt able to lift up into standing with min assist from therapist and UE support on arm chair, requiring mod assist to facilitate trunk elongation and upright standing.  Pt required prolonged seated rest breaks after each sit <> stand.  Returned to room and transferred back to recliner with use of Stedy.  Left with all needs in reach and attempting to use urinal.  Therapy Documentation Precautions:  Precautions Precautions: Fall Precaution Comments: Lt hemiparesis and ataxia Restrictions Weight Bearing Restrictions: No General:   Vital Signs: Therapy Vitals Temp: 98.7 F (37.1 C) Temp Source: Oral Pulse Rate: (!) 101 Resp: 16 BP: 120/61 Patient Position (if  appropriate): Sitting Oxygen Therapy SpO2: 96 % O2 Device: Not Delivered Pain:  Pt with intermittent c/o pain in Lt hip/thigh area.  RN aware.  See Function Navigator for Current Functional Status.   Therapy/Group: Individual Therapy  Simonne Come 08/09/2016, 3:09 PM

## 2016-08-10 ENCOUNTER — Inpatient Hospital Stay (HOSPITAL_COMMUNITY): Payer: Medicare Other | Admitting: Occupational Therapy

## 2016-08-10 ENCOUNTER — Inpatient Hospital Stay (HOSPITAL_COMMUNITY): Payer: Medicare Other | Admitting: Physical Therapy

## 2016-08-10 LAB — URINALYSIS, ROUTINE W REFLEX MICROSCOPIC
BILIRUBIN URINE: NEGATIVE
Glucose, UA: NEGATIVE mg/dL
Hgb urine dipstick: NEGATIVE
KETONES UR: NEGATIVE mg/dL
LEUKOCYTES UA: NEGATIVE
NITRITE: NEGATIVE
PH: 5 (ref 5.0–8.0)
Protein, ur: NEGATIVE mg/dL
Specific Gravity, Urine: 1.016 (ref 1.005–1.030)

## 2016-08-10 LAB — CBC
HEMATOCRIT: 39 % (ref 39.0–52.0)
HEMOGLOBIN: 12.9 g/dL — AB (ref 13.0–17.0)
MCH: 31.5 pg (ref 26.0–34.0)
MCHC: 33.1 g/dL (ref 30.0–36.0)
MCV: 95.4 fL (ref 78.0–100.0)
Platelets: 167 10*3/uL (ref 150–400)
RBC: 4.09 MIL/uL — ABNORMAL LOW (ref 4.22–5.81)
RDW: 14.3 % (ref 11.5–15.5)
WBC: 7.3 10*3/uL (ref 4.0–10.5)

## 2016-08-10 NOTE — Progress Notes (Signed)
Tearful at beginning of shift due to' uncontrollable pain'.  All 'scheduled' and 'prn' medication was already given by night nurse. Pt repositioned, and toileted.  Decrease memory deficit noted, as pt continues to forget how recent staff was in the room, and repeatly called staff for the same request. Pt apologetic for frequency requests. "I am a 'sad sack', and I don't like being alone. Nurse encouraged  pt to sit with staff at the nursing station. Pt declined'. However, indicated that he may do so in the future.  Emotional and spiritual support provided, as chaplin was into see pt. In the afternoon.

## 2016-08-10 NOTE — Progress Notes (Signed)
Physical Therapy Session Note  Patient Details  Name: Thomas Burnett MRN: [id number] Date of Birth: [birth year]  Today's Date: 08/10/2016 PT Individual Time: 1120-1204 PT Individual Time Calculation (min): 44 min    Short Term Goals: Week 2:  PT Short Term Goal 1 (Week 2): Pt will transfer with +1 assist using LRAD PT Short Term Goal 2 (Week 2): Pt will propel w/c 50' using R hemi-technique PT Short Term Goal 3 (Week 2): Pt will demonstrate alternating attention x5 minutes with min cues  Skilled Therapeutic Interventions/Progress Updates:    Pt received in recliner with wife present for session. Pt noted 9/10 LLE pain & initially declining participation in therapy. Therapist applied lotion to pt's LLE calf 2/2 pt request & RN notified of pt's c/o pain. Educated pt on importance and benefits of participation in therapy & pt agreeable. Throughout entire session pt requires max cuing to stay on task as he continues to attempt to engage in excessive conversation. Pt required max assist to complete forward weight shift in recliner & then completed sit<>stand with max assist and use of stedy device. Pt requires assistance for BLE foot placement and max cuing for sequencing. While sitting on stedy pt voided and condom catheter not properly positioned and pt with accident on floor. Pt required max assist to doff/don clothing (undershirt, top, and shorts) with pt completing ~3 sit<>stand transfers from stedy seat. Pt transferred to w/c and required max assist to control descent into chair, as well as max assist & cuing for anterior weight shifting to scoot buttocks back in seat. Pt encouraged to transfer to w/c daily to participate in therapies outside of his room & pt voiced understanding. At end of session pt left sitting in w/c in room with QRB in place & wife present to supervise. Instructed pt & wife to call for assistance if needed.   Therapy Documentation Precautions:  Precautions Precautions:  Fall Precaution Comments: Lt hemiparesis and ataxia Restrictions Weight Bearing Restrictions: No   See Function Navigator for Current Functional Status.   Therapy/Group: Individual Therapy  Waunita Schooner 08/10/2016, 12:45 PM

## 2016-08-10 NOTE — Progress Notes (Signed)
Occupational Therapy Session Note  Patient Details  Name: Thomas Burnett MRN: [id number] Date of Birth: 09-15-1920  Today's Date: 08/10/2016 OT Individual Time: 1030-1100 OT Individual Time Calculation (min): 30 min   Short Term Goals: Week 2:  OT Short Term Goal 1 (Week 2): Pt will complete LB dressing with max assist of one caregiver at sit > stand level OT Short Term Goal 2 (Week 2): Pt will complete bathing with mod assist of one caregiver at sit > stand level OT Short Term Goal 3 (Week 2): Pt will complete squat pivot transfer to toilet with max assist of one caregiver OT Short Term Goal 4 (Week 2): Pt will utilize LUE as gross assist during self-care tasks with <10% cues  Skilled Therapeutic Interventions/Progress Updates:    1:1 OT session focused on functional use of L Ue. Pt seated in recliner and declined functional transfers 2/2 pain and stated " I just got comfortable." Pt agreeable to participate in L UE strength/coordination exercises. Pt completed shoulder flexion x10, elbow flexion x10, coordination thumb to nose with 50% accuracy, 10 x thera-ball squeezes with cues. Pt labile and verbose during session stating he was lonely. OT provided emotional support, pt appreciative. Pt left seated in recliner at end of session with needs met.   Therapy Documentation Precautions:  Precautions Precautions: Fall Precaution Comments: Lt hemiparesis and ataxia Restrictions Weight Bearing Restrictions: No Pain: Pain Assessment Pain Assessment: 0-10 Pain Score: 5  Pain Type: Chronic pain Pain Location: Hip Pain Orientation: Left Pain Descriptors / Indicators: Aching Pain Onset: On-going Pain Intervention(s): Repositioned;Rest  See Function Navigator for Current Functional Status.   Therapy/Group: Individual Therapy  Valma Cava 08/10/2016, 4:21 PM

## 2016-08-10 NOTE — Progress Notes (Signed)
Occupational Therapy Weekly Progress Note  Patient Details  Name: Thomas Burnett MRN: [id number] Date of Birth: Sep 07, 1920  Beginning of progress report period: August 03, 2016 End of progress report period: August 10, 2016  Today's Date: 08/10/2016 OT Individual Time: 5053-9767 OT Individual Time Calculation (min): 70 min    Patient has met 1 of 4 short term goals.  Pt is making slow progress towards goals, due to decreased participation in therapy sessions.  Pt continues to c/o pain in LLE impacting his willingness to engage in sit <> stand or transfer training.  Pt is self-limiting secondary to his tendency to be excessively verbose with difficulty being re-directed.  Pt continues to require +2 assist without use of lift equipment or use of Stedy for sit > stand and transfers with only min-mod assist for lifting depending on height of surface.  Discussions have begun regarding amount of physical assistance pt will likely requires as well as potential need for additional rehabilitation post d/c.   Patient continues to demonstrate the following deficits: Lt hemiplegia, decreased activity tolerance, chronic pain, LUE ataxia, decreased sitting and standing balance and therefore will continue to benefit from skilled OT intervention to enhance overall performance with BADL and Reduce care partner burden.  Patient not progressing toward long term goals.  See goal revision..  Plan of care revisions: downgraded standing, LB dressing, and transfers to mod assist.  OT Short Term Goals Week 1:  OT Short Term Goal 1 (Week 1): Pt will complete LB dressing with max assist of one caregiver OT Short Term Goal 1 - Progress (Week 1): Progressing toward goal OT Short Term Goal 2 (Week 1): Pt will complete bathing with mod assist of one caregiver OT Short Term Goal 2 - Progress (Week 1): Progressing toward goal OT Short Term Goal 3 (Week 1): Pt will complete squat pivot transfer to toilet with max assist  of one caregiver OT Short Term Goal 3 - Progress (Week 1): Progressing toward goal OT Short Term Goal 4 (Week 1): Pt will complet sit > stand with max assist of one caregiver to decrease burden of care with self-care tasks OT Short Term Goal 4 - Progress (Week 1): Met Week 2:  OT Short Term Goal 1 (Week 2): Pt will complete LB dressing with max assist of one caregiver at sit > stand level OT Short Term Goal 2 (Week 2): Pt will complete bathing with mod assist of one caregiver at sit > stand level OT Short Term Goal 3 (Week 2): Pt will complete squat pivot transfer to toilet with max assist of one caregiver OT Short Term Goal 4 (Week 2): Pt will utilize LUE as gross assist during self-care tasks with <10% cues   Skilled Therapeutic Interventions/Progress Updates:    Treatment session with focus on trunk control, anterior weight shift, and LUE functional use during self-care tasks. Pt received in w/c and willing to engage in only UB bathing and dressing this session.  Pt utilized LUE without cues during UB bathing, with improved gross motor control when washing body only dropping cloth x1.  Engaged in [place] of button up shirt in lap to utilize LUE into buttoning as well as increase independence with donning shirt via pull over technique.  Pt required increased time with buttoning due to ataxia and impaired sensation.  Educated on hemi-dressing technique with pt able to thread both sleeves and pull shirt over head, requiring assist to pull shirt over trunk.  Utilized sink side for pt  to pull forward with to allow therapist to wash back and pull shirt down over trunk.  Left upright in w/c with quick release belt in place and all needs in reach.  Therapy Documentation Precautions: Precautions Precautions: Fall Precaution Comments: Lt hemiparesis and ataxia Restrictions Weight Bearing Restrictions: No General:   Vital Signs: Therapy Vitals Temp: 97.8 F (36.6 C) Temp Source: Oral Pulse Rate:  87 Resp: 18 BP: 120/67 Patient Position (if appropriate): Lying Oxygen Therapy SpO2: 98 % O2 Device: Not Delivered Pain: Pain Assessment Pain Score: Asleep Faces Pain Scale: No hurt  See Function Navigator for Current Functional Status.   Therapy/Group: Individual Therapy  Simonne Come 08/10/2016, 7:30 AM

## 2016-08-10 NOTE — Progress Notes (Signed)
Responded to spiritual Care Consult to pray with patient.  Prayer was done and patient request f/u visit tomorrow afternoon. Wife at bedside. Both were thankful for visit.  I will pass on to unit chaplain for f/u. Provided listening, emotional and spiritual support.   08/10/16 1600  Clinical Encounter Type  Visited With Patient;Family;Patient and family together;Health care provider  Visit Type Initial;Spiritual support  Referral From Nurse  Spiritual Encounters  Spiritual Needs Prayer;Emotional  Stress Factors  Patient Stress Factors None identified  Family Stress Factors None identified  Jaclynn Major, [place]

## 2016-08-10 NOTE — Progress Notes (Signed)
Subjective/Complaints: Slept well discussed meds He used condom cath as well last noc ROS- neg CP, SOB, N/V/D, no abd pain Objective: Vital Signs: Blood pressure 120/67, pulse 87, temperature 97.8 F (36.6 C), temperature source Oral, resp. rate 18, height _0  (1.676 m), weight 87.8 kg (193 lb 9 oz), SpO2 98 %. No results found. Results for orders placed or performed during the hospital encounter of 08/02/16 (from the past 72 hour(s))  Basic metabolic panel     Status: Abnormal   Collection Time: 08/09/16  5:25 AM  Result Value Ref Range   Sodium 138 135 - 145 mmol/L   Potassium 4.3 3.5 - 5.1 mmol/L   Chloride 102 101 - 111 mmol/L   CO2 26 22 - 32 mmol/L   Glucose, Bld 121 (H) 65 - 99 mg/dL   BUN 26 (H) 6 - 20 mg/dL   Creatinine, Ser 1.54 (H) 0.61 - 1.24 mg/dL   Calcium 9.3 8.9 - 10.3 mg/dL   GFR calc non Af Amer 37 (L) >60 mL/min   GFR calc Af Amer 43 (L) >60 mL/min    Comment: (NOTE) The eGFR has been calculated using the CKD EPI equation. This calculation has not been validated in all clinical situations. eGFR's persistently <60 mL/min signify possible Chronic Kidney Disease.    Anion gap 10 5 - 15  CBC     Status: Abnormal   Collection Time: 08/10/16  5:32 AM  Result Value Ref Range   WBC 7.3 4.0 - 10.5 K/uL   RBC 4.09 (L) 4.22 - 5.81 MIL/uL   Hemoglobin 12.9 (L) 13.0 - 17.0 g/dL   HCT 39.0 39.0 - 52.0 %   MCV 95.4 78.0 - 100.0 fL   MCH 31.5 26.0 - 34.0 pg   MCHC 33.1 30.0 - 36.0 g/dL   RDW 14.3 11.5 - 15.5 %   Platelets 167 150 - 400 K/uL      General: No acute distress Mood and affect are appropriate Heart: Regular rate and rhythm no rubs murmurs or extra sounds, - JVD Lungs: Clear to auscultation, breathing unlabored, no rales or wheezes Abdomen: Positive bowel sounds, soft nontender to palpation, nondistended Extremities: No clubbing, cyanosis, or edema Skin: No evidence of breakdown, no evidence of rash Neurologic: Cranial nerves II through XII  intact, motor strength is 5/5 in R deltoid, bicep, tricep, grip, hip flexor, knee extensors, ankle dorsiflexor and plantar flexor, 4/5 on left  Sensory exam normal sensation to light touch  bilateral upper and lower extremities Cerebellar exam normal finger to nose to finger as well as heel to shin in bilateral upperextremities Musculoskeletal: Pain with left hip abd , int/ext rotation, limited ROM oth hips, no pain to palp over knee, no effusion, no pain with ROM, Pedal edema, but no ankle or foot pain with palp or ROM, neg Homans, neg SLR   Assessment/Plan: 1. 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 Physiatrist is providing close team supervision and 24 hour management of active medical problems listed below. Physiatrist and rehab team continue to assess barriers to discharge/monitor patient progress toward functional and medical goals. FIM: Function - Bathing Position: Other (comment) (while sitting on BSC) Body parts bathed by patient: Chest, Abdomen, Front perineal area, Right upper leg, Right lower leg Body parts bathed by helper: Right arm, Left arm, Buttocks, Left lower leg, Left upper leg, Back Assist Level:  (Max assist)  Function-  Upper Body Dressing/Undressing What is the patient wearing?: Pull over shirt/dress Pull over shirt/dress - Perfomed by patient: Thread/unthread right sleeve, Thread/unthread left sleeve Pull over shirt/dress - Perfomed by helper: Put head through opening, Pull shirt over trunk Assist Level:  (Mod assist) Function - Lower Body Dressing/Undressing What is the patient wearing?: Pants Position: Other (comment) (recliner using stedy) Pants- Performed by patient: Pull pants up/down Pants- Performed by helper: Thread/unthread right pants leg, Thread/unthread left pants leg, Pull pants up/down, Fasten/unfasten pants Non-skid slipper socks-  Performed by helper: Don/doff right sock, Don/doff left sock Assist for footwear: Maximal assist Assist for lower body dressing: 2 Helpers  Function - Toileting Toileting steps completed by helper: Adjust clothing prior to toileting, Performs perineal hygiene, Adjust clothing after toileting Toileting Assistive Devices:  (stedy) Assist level:  (Total assist)  Function - Air cabin crew transfer assistive device: Bedside commode Mechanical lift: Stedy Assist level to toilet: No Help, No cues Assist level from toilet: Maximal assist (Pt 25 - 49%/lift and lower) Assist level to bedside commode (at bedside): 2 helpers Assist level from bedside commode (at bedside): 2 helpers  Function - Chair/bed transfer Chair/bed transfer method: Other Chair/bed transfer assist level: 2 helpers Chair/bed transfer assistive device: Armrests Mechanical lift: Stedy Chair/bed transfer details: Manual facilitation for weight shifting, Manual facilitation for placement, Verbal cues for safe use of DME/AE, Verbal cues for technique, Verbal cues for sequencing  Function - Locomotion: Wheelchair Will patient use wheelchair at discharge?: Yes Type: Manual Max wheelchair distance: 50 Assist Level: Moderate assistance (Pt 50 - 74%) Assist Level: Moderate assistance (Pt 50 - 74%) Assist Level: Dependent (Pt equals 0%) Turns around,maneuvers to table,bed, and toilet,negotiates 3% grade,maneuvers on rugs and over doorsills: No Function - Locomotion: Ambulation Ambulation activity did not occur: Safety/medical concerns (per report) Assistive device: Clarise Cruz plus Max distance: 10+10 Assist level: 2 helpers Walk 10 feet activity did not occur: Safety/medical concerns Assist level: 2 helpers Walk 50 feet with 2 turns activity did not occur: Safety/medical concerns Walk 150 feet activity did not occur: Safety/medical concerns Walk 10 feet on uneven surfaces activity did not occur: Safety/medical  concerns  Function - Comprehension Comprehension: Auditory Comprehension assist level: Understands complex 90% of the time/cues 10% of the time  Function - Expression Expression: Verbal Expression assist level: Expresses complex ideas: With extra time/assistive device  Function - Social Interaction Social Interaction assist level: Interacts appropriately 90% of the time - Needs monitoring or encouragement for participation or interaction.  Function - Problem Solving Problem solving assist level: Solves basic 90% of the time/requires cueing < 10% of the time  Function - Memory Memory assist level: Recognizes or recalls 75 - 89% of the time/requires cueing 10 - 24% of the time Patient normally able to recall (first 3 days only): Current season, That he or she is in a hospital, Staff names and faces  Medical Problem List and Plan: 1.  Weakness, gait instability secondary to right internal capsule/thalamic hemorrhage. Left hemiataxia, cont CIR PT, OT  Team conf in am  2.  DVT Prophylaxis/Anticoagulation: Pharmaceutical: Lovenox, eventual plan for Eliquis 3. Pain Management: PRN tylenol, On tramadol and T#3, aspercreme 4. Mood: LCSW to follow for evaluation and support. Ask Chaplain to visit 5. Neuropsych: This patient is capable of making decisions on his own behalf. 6. Skin/Wound Care: Routine pressure relief measures.              Will consider santly to left elbow wound.  7. Fluids/Electrolytes/Nutrition: Advertising copywriter  fluid intake and offer supplements between meals. Monitor I/O. Check lytes in am.  8. DYN:XGZFPOI:-PPGFQMKJ ON Norvasc, Lisinopril and Lasix daily. SBP goal<160.  Vitals:   08/09/16 1401 08/10/16 0435  BP: 120/61 120/67  Pulse: (!) 101 87  Resp: 16 18  Temp: 98.7 F (37.1 C) 97.8 F (36.6 C)    9. IZX:YOFVWAQ HR bid--has been controlled.  10. Pre-renal azotemia: Encourage po fluids. Add supplements between meals. Elevated BUN and creat  Stop prinivil and lasix ,  recheck BMET later in week  11. Chronic insomnia: sleeps in a recliner at nights. Pt states this is due to nasal congestion will try ocean nasal spray 12. Hyperlipidemia: Pravastatin added this admission.  13.  Left hip pain severe  OA on xray, start tramadol during the day and Tylenol with codeine at noc, prn muscle rub  LOS (Days) 8 A FACE TO FACE EVALUATION WAS PERFORMED  KIRSTEINS,ANDREW E 08/10/2016, 8:07 AM

## 2016-08-10 NOTE — Progress Notes (Signed)
Recreational Therapy Session Note  Patient Details  Name: Thomas Burnett MRN: [id number] Date of Birth: Feb 01, 1920 Today's Date: 08/10/2016   Met with pt x2 & pt with little interest in TR services.  Pt is very sociable and enjoys sharing life experiences.  Will continue to monitor through team. Hadlyn Amero 08/10/2016, 3:28 PM

## 2016-08-10 NOTE — Progress Notes (Signed)
Physical Therapy Session Note  Patient Details  Name: Thomas Burnett MRN: [id number] Date of Birth: 05/16/1920  Today's Date: 08/10/2016 PT Individual Time: 1430-1500 PT Individual Time Calculation (min): 30 min    Short Term Goals: Week 2:  PT Short Term Goal 1 (Week 2): Pt will transfer with +1 assist using LRAD PT Short Term Goal 2 (Week 2): Pt will propel w/c 50' using R hemi-technique PT Short Term Goal 3 (Week 2): Pt will demonstrate alternating attention x5 minutes with min cues  Skilled Therapeutic Interventions/Progress Updates:    Pt resting in w/c on arrival, no c/o pain at rest and agreeable to therapy session.  PT instructed pt in BLE therex 2x10 reps LAQ and seated hip flexion for strength and NMR of LLE.  Sit<>stand from w/c with pt pulling up with STEDY, min assist to facilitate forward weight shift.  Pt able to tolerate standing in STEDY x3 minutes.  Stand>sit in recliner with steady assist and min cues for forward weight shift. Pt left in recliner with call bell in reach and needs met.   Therapy Documentation Precautions:  Precautions Precautions: Fall Precaution Comments: Lt hemiparesis and ataxia Restrictions Weight Bearing Restrictions: No   See Function Navigator for Current Functional Status.   Therapy/Group: Individual Therapy   E Penven-Crew 08/10/2016, 3:02 PM  

## 2016-08-10 NOTE — Plan of Care (Signed)
Problem: RH Balance Goal: LTG Patient will maintain dynamic standing with ADLs (OT) LTG:  Patient will maintain dynamic standing balance with assist during activities of daily living (OT)   Downgraded due to slow progress and pain impeding participation  Problem: RH Bathing Goal: LTG Patient will bathe with assist, cues/equipment (OT) LTG: Patient will bathe specified number of body parts with assist with/without cues using equipment (position)  (OT)  Downgraded due to slow progress and pain impeding participation  Problem: RH Dressing Goal: LTG Patient will perform lower body dressing w/assist (OT) LTG: Patient will perform lower body dressing with assist, with/without cues in positioning using equipment (OT)  Downgraded due to slow progress and pain impeding participation  Problem: RH Toilet Transfers Goal: LTG Patient will perform toilet transfers w/assist (OT) LTG: Patient will perform toilet transfers with assist, with/without cues using equipment (OT)  Downgraded due to slow progress and pain impeding participation  Problem: RH Tub/Shower Transfers Goal: LTG Patient will perform tub/shower transfers w/assist (OT) LTG: Patient will perform tub/shower transfers with assist, with/without cues using equipment (OT)  Downgraded due to slow progress and pain impeding participation

## 2016-08-11 ENCOUNTER — Inpatient Hospital Stay (HOSPITAL_COMMUNITY): Payer: Medicare Other | Admitting: Occupational Therapy

## 2016-08-11 ENCOUNTER — Inpatient Hospital Stay (HOSPITAL_COMMUNITY): Payer: Medicare Other

## 2016-08-11 ENCOUNTER — Inpatient Hospital Stay (HOSPITAL_COMMUNITY): Payer: Medicare Other | Admitting: Physical Therapy

## 2016-08-11 LAB — URINE CULTURE: CULTURE: NO GROWTH

## 2016-08-11 MED ORDER — COLLAGENASE 250 UNIT/GM EX OINT
TOPICAL_OINTMENT | Freq: Every day | CUTANEOUS | Status: DC
  Administered 2016-08-11 – 2016-08-17 (×7): via TOPICAL
  Filled 2016-08-11: qty 30

## 2016-08-11 MED ORDER — OXYCODONE HCL ER 10 MG PO T12A
10.0000 mg | EXTENDED_RELEASE_TABLET | Freq: Every day | ORAL | Status: DC
  Administered 2016-08-11: 10 mg via ORAL
  Filled 2016-08-11: qty 1

## 2016-08-11 NOTE — Progress Notes (Addendum)
 Subjective/Complaints: C/o buttocks pain last noc, also with Left hip pain, pain meds do not last long enough RN noted a small skin area on buttocks ROS- neg CP, SOB, N/V/D, no abd pain Objective: Vital Signs: Blood pressure (!) 159/63, pulse 90, temperature 98.4 F (36.9 C), temperature source Oral, resp. rate 18, height 5' 6" (1.676 m), weight 87.8 kg (193 lb 9 oz), SpO2 99 %. No results found. Results for orders placed or performed during the hospital encounter of 08/02/16 (from the past 72 hour(s))  Basic metabolic panel     Status: Abnormal   Collection Time: 08/09/16  5:25 AM  Result Value Ref Range   Sodium 138 135 - 145 mmol/L   Potassium 4.3 3.5 - 5.1 mmol/L   Chloride 102 101 - 111 mmol/L   CO2 26 22 - 32 mmol/L   Glucose, Bld 121 (H) 65 - 99 mg/dL   BUN 26 (H) 6 - 20 mg/dL   Creatinine, Ser 1.54 (H) 0.61 - 1.24 mg/dL   Calcium 9.3 8.9 - 10.3 mg/dL   GFR calc non Af Amer 37 (L) >60 mL/min   GFR calc Af Amer 43 (L) >60 mL/min    Comment: (NOTE) The eGFR has been calculated using the CKD EPI equation. This calculation has not been validated in all clinical situations. eGFR's persistently <60 mL/min signify possible Chronic Kidney Disease.    Anion gap 10 5 - 15  CBC     Status: Abnormal   Collection Time: 08/10/16  5:32 AM  Result Value Ref Range   WBC 7.3 4.0 - 10.5 K/uL   RBC 4.09 (L) 4.22 - 5.81 MIL/uL   Hemoglobin 12.9 (L) 13.0 - 17.0 g/dL   HCT 39.0 39.0 - 52.0 %   MCV 95.4 78.0 - 100.0 fL   MCH 31.5 26.0 - 34.0 pg   MCHC 33.1 30.0 - 36.0 g/dL   RDW 14.3 11.5 - 15.5 %   Platelets 167 150 - 400 K/uL  Urinalysis, Routine w reflex microscopic (not at [facility])     Status: None   Collection Time: 08/10/16  4:43 PM  Result Value Ref Range   Color, Urine YELLOW YELLOW   APPearance CLEAR CLEAR   Specific Gravity, Urine 1.016 1.005 - 1.030   pH 5.0 5.0 - 8.0   Glucose, UA NEGATIVE NEGATIVE mg/dL   Hgb urine dipstick NEGATIVE NEGATIVE   Bilirubin Urine NEGATIVE  NEGATIVE   Ketones, ur NEGATIVE NEGATIVE mg/dL   Protein, ur NEGATIVE NEGATIVE mg/dL   Nitrite NEGATIVE NEGATIVE   Leukocytes, UA NEGATIVE NEGATIVE    Comment: MICROSCOPIC NOT DONE ON URINES WITH NEGATIVE PROTEIN, BLOOD, LEUKOCYTES, NITRITE, OR GLUCOSE <1000 mg/dL.      General: No acute distress Mood and affect are appropriate Heart: Regular rate and rhythm no rubs murmurs or extra sounds, - JVD Lungs: Clear to auscultation, breathing unlabored, no rales or wheezes Abdomen: Positive bowel sounds, soft nontender to palpation, nondistended Extremities: No clubbing, cyanosis, Left pedal edema  Neurologic: Cranial nerves II through XII intact, motor strength is 5/5 in R deltoid, bicep, tricep, grip, hip flexor, knee extensors, ankle dorsiflexor and plantar flexor, 4/5 on left  Sensory exam normal sensation to light touch  bilateral upper and lower extremities Cerebellar exam normal finger to nose to finger as well as heel to shin in bilateral upperextremities Musculoskeletal: Pain with left hip abd , int/ext rotation, limited ROM oth hips, no pain to palp over knee, no effusion, no pain with ROM,   Pedal edema, but no ankle or foot pain with palp or ROM, neg Homans, neg SLR   Assessment/Plan: 1. 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 Physiatrist is providing close team supervision and 24 hour management of active medical problems listed below. Physiatrist and rehab team continue to assess barriers to discharge/monitor patient progress toward functional and medical goals. FIM: Function - Bathing Position: Wheelchair/chair at sink Body parts bathed by patient: Right arm, Left arm, Chest, Abdomen, Front perineal area (declined LB bathing) Body parts bathed by helper: Back Bathing not applicable: Buttocks, Right upper leg, Left upper leg, Right lower leg, Left lower leg Assist  Level:  (Max assist)  Function- Upper Body Dressing/Undressing What is the patient wearing?: Pull over shirt/dress Pull over shirt/dress - Perfomed by patient: Thread/unthread right sleeve, Thread/unthread left sleeve, Put head through opening Pull over shirt/dress - Perfomed by helper: Pull shirt over trunk Assist Level:  (Min assist) Function - Lower Body Dressing/Undressing What is the patient wearing?: Pants Position: Other (comment) (recliner using stedy) Pants- Performed by patient: Pull pants up/down Pants- Performed by helper: Thread/unthread right pants leg, Thread/unthread left pants leg, Pull pants up/down, Fasten/unfasten pants Non-skid slipper socks- Performed by helper: Don/doff right sock, Don/doff left sock Assist for footwear: Maximal assist Assist for lower body dressing: 2 Helpers  Function - Toileting Toileting steps completed by patient: Adjust clothing prior to toileting, Performs perineal hygiene, Adjust clothing after toileting (for use of urinal) Toileting steps completed by helper: Adjust clothing prior to toileting, Performs perineal hygiene, Adjust clothing after toileting Toileting Assistive Devices:  (stedy) Assist level:  (Total assist)  Function - Air cabin crew transfer assistive device: Bedside commode Mechanical lift: Stedy Assist level to toilet: No Help, No cues Assist level from toilet: Maximal assist (Pt 25 - 49%/lift and lower) Assist level to bedside commode (at bedside): 2 helpers Assist level from bedside commode (at bedside): 2 helpers  Function - Chair/bed transfer Chair/bed transfer method: Other Chair/bed transfer assist level: dependent (Pt equals 0%) (min assist for sit<>stand from STEDY) Chair/bed transfer assistive device: Armrests Mechanical lift: Stedy Chair/bed transfer details: Manual facilitation for weight shifting, Manual facilitation for placement, Verbal cues for safe use of DME/AE, Verbal cues for technique,  Verbal cues for sequencing  Function - Locomotion: Wheelchair Will patient use wheelchair at discharge?: Yes Type: Manual Max wheelchair distance: 50 Assist Level: Moderate assistance (Pt 50 - 74%) Assist Level: Moderate assistance (Pt 50 - 74%) Assist Level: Dependent (Pt equals 0%) Turns around,maneuvers to table,bed, and toilet,negotiates 3% grade,maneuvers on rugs and over doorsills: No Function - Locomotion: Ambulation Ambulation activity did not occur: Safety/medical concerns (per report) Assistive device: Clarise Cruz plus Max distance: 10+10 Assist level: 2 helpers Walk 10 feet activity did not occur: Safety/medical concerns Assist level: 2 helpers Walk 50 feet with 2 turns activity did not occur: Safety/medical concerns Walk 150 feet activity did not occur: Safety/medical concerns Walk 10 feet on uneven surfaces activity did not occur: Safety/medical concerns  Function - Comprehension Comprehension: Auditory Comprehension assist level: Understands basic 90% of the time/cues < 10% of the time  Function - Expression Expression: Verbal Expression assist level: Expresses complex ideas: With extra time/assistive device  Function - Social Interaction Social Interaction assist level: Interacts appropriately 90% of the time - Needs monitoring or encouragement for participation or interaction.  Function - Problem Solving Problem solving assist level: Solves basic 90% of the  time/requires cueing < 10% of the time  Function - Memory Memory assist level: Recognizes or recalls 75 - 89% of the time/requires cueing 10 - 24% of the time Patient normally able to recall (first 3 days only): Current season, That he or she is in a hospital, Staff names and faces  Medical Problem List and Plan: 1.  Weakness, gait instability secondary to right internal capsule/thalamic hemorrhage. Left hemiataxia, cont CIR PT, OT  Team conference today please see physician documentation under team conference  tab, met with team face-to-face to discuss problems,progress, and goals. Formulized individual treatment plan based on medical history, underlying problem and comorbidities.                             2.  DVT Prophylaxis/Anticoagulation: Pharmaceutical: Lovenox, eventual plan for Eliquis 3. Pain Management: PRN tylenol, On tramadol , twill trial low dose oxycontin 10mg QHS aspercreme 4. Mood: LCSW to follow for evaluation and support. Ask Chaplain to visit 5. Neuropsych: This patient is capable of making decisions on his own behalf. 6. Skin/Wound Care: Routine pressure relief measures.              Will consider santly to left elbow wound.  7. Fluids/Electrolytes/Nutrition: Encourage fluid intake and offer supplements between meals. Monitor I/O. Check lytes in am.  8. HTN:Monitor:-continue ON Norvasc, Lisinopril and Lasix daily. SBP goal<160.  Vitals:   08/10/16 1506 08/11/16 0415  BP: 129/68 (!) 159/63  Pulse: 88 90  Resp: 18 18  Temp: 98.7 F (37.1 C) 98.4 F (36.9 C)    9. CAF:Monitor HR bid--has been controlled.  10. Pre-renal azotemia: Encourage po fluids. Add supplements between meals. Elevated BUN and creat  Stop prinivil and lasix , recheck BMET later in week  11. Chronic insomnia: sleeps in a recliner at nights. Pt states this is due to nasal congestion will try ocean nasal spray 12. Hyperlipidemia: Pravastatin added this admission.  13.  Left hip pain severe  OA on xray, start tramadol during the day and Tylenol with codeine at noc, prn muscle rub  LOS (Days) 9 A FACE TO FACE EVALUATION WAS PERFORMED  , E 08/11/2016, 9:07 AM   

## 2016-08-11 NOTE — Progress Notes (Signed)
Occupational Therapy Note  Patient Details  Name: Thomas Burnett MRN: [id number] Date of Birth: 05-16-1920  Today's Date: 08/11/2016 OT Individual Time: 1300-1330 OT Individual Time Calculation (min): 30 min  and Today's Date: 08/11/2016 OT Missed Time: 30 Minutes Missed Time Reason: Patient fatigue;Pain    Pt c/o BLE pain (unrated); RN aware and repositioned Individual Therapy  Pt resting in recliner upon arrival with bedside table and lunch tray positioned in front of him.  Pt stated he had a rough night secondary to increased pain throughout the night.  Pt stated that pain meds were not effective.  Pt stated he was extremely tired.  Pt had only eaten one or two bites of lunch and stated he was not hungry but did accept offer of vanilla ice cream. Pt required assistance holding cream while self feeding ice cream.  Pt stated he was too tired to eat after eating 1/4 of cup and declined further food.  Attempted therapeutic activities/tasks with LUE but pt exhibited difficulty keeping his eyes open and engaging with conversation/tasks.  Pt stated he was too tired to continue.  Pt remained in recliner with all needs within reach.   Leotis Shames [facility] 08/11/2016, 1:43 PM

## 2016-08-11 NOTE — Progress Notes (Signed)
Occupational Therapy Session Note  Patient Details  Name: GINA MCGLADE MRN: [id number] Date of Birth: 18-Apr-1920  Today's Date: 08/11/2016 OT Individual Time: 1100-1200 OT Individual Time Calculation (min): 60 min     Short Term Goals:Week 2:  OT Short Term Goal 1 (Week 2): Pt will complete LB dressing with max assist of one caregiver at sit > stand level OT Short Term Goal 2 (Week 2): Pt will complete bathing with mod assist of one caregiver at sit > stand level OT Short Term Goal 3 (Week 2): Pt will complete squat pivot transfer to toilet with max assist of one caregiver OT Short Term Goal 4 (Week 2): Pt will utilize LUE as gross assist during self-care tasks with <10% cues  Skilled Therapeutic Interventions/Progress Updates:    Pt seen for OT session focusing on ADL re-training and functional sitting balance/ endurance. Pt in recliner upon arrival, family present, however, did not stay for session.  Pt perseverating on L LE discomfort from over night, able to be redirected.  With encouragement and re-direction, pt agreeable to tx session. He required min-mod +2 to stand in STEADY. Completed UB bathing and grooming tasks from STEADY, overall min A for balance, occasional supervision with VCs for weight shift. He was able to use B UEs to assist with bathing, dropping washcloth 4 times throughout session.  Voiced need for toileting. STEADY used to transfer to [facility]. +2 assist required for all aspects of toileting. Pt with difficulty obtaining full upright stance.  Attempted to have pt button large button up shirt, however, due to decreased fine motor coordination pt unable to independently button.  Pt left seated in recliner at end of session, all needs in reach and caregiver present.   Therapy Documentation Precautions:  Precautions Precautions: Fall Precaution Comments: Lt hemiparesis and ataxia Restrictions Weight Bearing Restrictions: No See Function Navigator for Current  Functional Status.   Therapy/Group: Individual Therapy  Lewis, Markiya Keefe C 08/11/2016, 7:20 AM

## 2016-08-11 NOTE — Progress Notes (Signed)
Social Work Patient ID: Thomas Burnett, male   DOB: July 18, 1920, 82 y.o.   MRN: [id number]  Agency with wife regarding team conference and pt's progress and since not making much can not justify keeping him until 11/20. Asked if she has discussed going to a NH upon discharge with him. She does not want to be the first one to do this. Wants SW to begin the conversation with him. She is aware of his complaints of wanting a  Private duty RN to cater to his needs due to staff not paying enough attention to him. Wife has hired a Actuary at night to listen to him and do the small things for him. Pt is very needy and draining. Will proach the subject Of NHP and beginning to start the process.

## 2016-08-11 NOTE — Progress Notes (Signed)
This is a return visit as promised. Patient is in good spirits and would welcome any visit at any time.  Chaplain avalible as needed.

## 2016-08-11 NOTE — Patient Care Conference (Signed)
Inpatient RehabilitationTeam Conference and Plan of Care Update Date: 08/11/2016   Time: 10:30 AM    Patient Name: Thomas Burnett      Medical Record Number: [id number]  Date of Birth: 12/29/1919 Sex: Male         Room/Bed: 4W24C/4W24C-01 Payor Info: Payor: MEDICARE / Plan: MEDICARE PART A AND B / Product Type: *No Product type* /    Admitting Diagnosis: CVA  Admit Date/Time:  08/02/2016  3:33 PM Admission Comments: No comment available   Primary Diagnosis:  <principal problem not specified> Principal Problem: <principal problem not specified>  Patient Active Problem List   Diagnosis Date Noted  . Hypertensive emergency 08/02/2016  . Chronic a-fib ([place]) 08/02/2016  . Hyperlipidemia 08/02/2016  . Obesity 08/02/2016  . Prostate cancer ([place]) 08/02/2016  . Right bundle branch block 08/02/2016  . Nontraumatic thalamic hemorrhage ([place]) 08/02/2016  . Gait disturbance, post-stroke   . Left hemiparesis ([place])   . Open wound   . Benign essential HTN   . Chronic atrial fibrillation ([place])   . Prerenal azotemia   . Primary insomnia   . Mixed hyperlipidemia   . ICH (intracerebral hemorrhage) ([facility]) - small R thalamic d/t HTN 07/28/2016    Expected Discharge Date: Expected Discharge Date: 08/23/16  Team Members Present: Physician leading conference: Dr. Alysia Penna Social Worker Present: Ovidio Kin, LCSW Nurse Present: Heather Roberts, RN PT Present: Dwyane Dee, PT OT Present: Clyda Greener, OT SLP Present: Windell Moulding, SLP PPS Coordinator present : Daiva Nakayama, RN, CRRN     Current Status/Progress Goal Weekly Team Focus  Medical   one person assist, starting to plateau, variable motivation  Nocturnal pain control  adjust meds, concern with potential SEa   Bowel/Bladder   continent bowel and bladder min assist with urinal   min assist   continue to monitor assist needed    Swallow/Nutrition/ Hydration             ADL's   +2 for transfers and any functional  standing tasks.   downgraded to mod assist overall  sit > stand, LUE NMR, transfers, ADL retraining, activity tolerance, d/c planning   Mobility   max assist without equipment, min>mod assist for transfers with STEDY  downgraded to mod for transfers and standing balance, d/c car transfer, ambulation goal, mod assist for w/c propulsion for endurance/strengthening  d/c plan to SNF, continue to work on activity tolerance, weight shifts, and transfers    Communication             Safety/Cognition/ Behavioral Observations  n/a         Pain   ultram 50 mg po every 6 hours, tylenol #3 at hs poor control per patient  less than 3  increase or change medication for pain and educate on to reposition   Skin   right elbow skin tear abrasion healing to right shin  no new breakdown  educte patient on management of skin care       *See Care Plan and progress notes for long and short-term goals.  Barriers to Discharge: pt inconsistent    Possible Resolutions to Barriers:  Family planning SNF    Discharge Planning/Teaching Needs:  Wife is feeling pt will be too much care for home from rehab is starting to look at other options. She has been here and seen him in therapies.      Team Discussion:  Team feels pt is progressing slowly and if he participates with them in therapies,  it is dependent upon how he feels and his pain level. He continues to talk and distract himself in therapies. Wife is planning on NHP but has not told pt wants SW to approach the subject with him.  Revisions to Treatment Plan:  Downgraded goals to mod assist level-probable NHP   Continued Need for Acute Rehabilitation Level of Care: The patient requires daily medical management by a physician with specialized training in physical medicine and rehabilitation for the following conditions: Daily analysis of laboratory values and/or radiology reports with any subsequent need for medication adjustment of medical intervention for :  Neurological problems;Other  [name], [name] 08/13/2016, 9:00 AM

## 2016-08-11 NOTE — Progress Notes (Signed)
Physical Therapy Session Note  Patient Details  Name: Thomas Burnett MRN: [id number] Date of Birth: January 07, 1920  Today's Date: 08/11/2016 PT Individual Time: 0902-0928 PT Individual Time Calculation (min): 26 min    Short Term Goals: Week 1:  PT Short Term Goal 1 (Week 1): Pt will be able to perform functional transfers with max assist PT Short Term Goal 1 - Progress (Week 1): Not met PT Short Term Goal 2 (Week 1): Pt will be able to initiate gait with max +2 assist PT Short Term Goal 2 - Progress (Week 1): Met PT Short Term Goal 3 (Week 1): Pt will be able to propel w/c in controlled environment with min assist x 100' PT Short Term Goal 3 - Progress (Week 1): Not met Week 2:  PT Short Term Goal 1 (Week 2): Pt will transfer with +1 assist using LRAD PT Short Term Goal 2 (Week 2): Pt will propel w/c 50' using R hemi-technique PT Short Term Goal 3 (Week 2): Pt will demonstrate alternating attention x5 minutes with min cues  Skilled Therapeutic Interventions/Progress Updates:     Patient received sitting in recliner and reports significant LLE pain 7/10  starting in the Hip and radiating into calf. Due to pain, patient declined any standing activities.  PT instructed patient sitting therex and educated patient on benefit of continued LE movement and exercise.  LAQ RLE x 12, LLE x 8 HS curl RLE x 12, LLE x 8 Hip abduction RLE x 10, LLE x 8  Ankle PF BLE x 15   Patient required min assist for repositioning in seat x 2 due to hip pain  With mod cues for proper anterior weight shift to allow unweighting of Gluteal region and allow posterior reciprocal scooting.   Patient left sitting in recliner with call bell in reach and all needs met.    Therapy Documentation Precautions:  Precautions Precautions: Fall Precaution Comments: Lt hemiparesis and ataxia Restrictions Weight Bearing Restrictions: No  See Function Navigator for Current Functional Status.   Therapy/Group: Individual  Therapy  Lorie Phenix 08/11/2016, 9:34 AM

## 2016-08-12 ENCOUNTER — Ambulatory Visit (HOSPITAL_COMMUNITY): Payer: Medicare Other | Admitting: Physical Therapy

## 2016-08-12 ENCOUNTER — Encounter (HOSPITAL_COMMUNITY): Payer: Medicare Other

## 2016-08-12 ENCOUNTER — Inpatient Hospital Stay (HOSPITAL_COMMUNITY): Payer: Medicare Other | Admitting: Physical Therapy

## 2016-08-12 ENCOUNTER — Inpatient Hospital Stay (HOSPITAL_COMMUNITY): Payer: Medicare Other | Admitting: Occupational Therapy

## 2016-08-12 MED ORDER — OXYCODONE HCL ER 15 MG PO T12A
15.0000 mg | EXTENDED_RELEASE_TABLET | Freq: Two times a day (BID) | ORAL | Status: DC
  Administered 2016-08-12 – 2016-08-15 (×7): 15 mg via ORAL
  Filled 2016-08-12 (×7): qty 1

## 2016-08-12 NOTE — Progress Notes (Signed)
2120 Pt. Medicated with HS meds for pain per request and also given trazadone to assist with sleep. Follow up med effectiveness. Patient asleep in bed. Continue to monitor during night.

## 2016-08-12 NOTE — Consult Note (Signed)
NEUROCOGNITIVE STATUS EXAMINATION - [name]   Mr. Thomas Burnett is a 82 year old man, who was seen for a brief neurocognitive status examination to evaluate his emotional and cognitive functioning in the setting of right acute thalamic hemorrhage with mild edema.    Cognitive Functioning:  Mr. [name] score on a very brief measure of mental status was within normal limits (MMSE-2 brief = 15/16) with one point lost for freely recalling only 2 of 3 previously studied words after a brief delay.  Subjectively, he denied noticing cognitive changes post-stroke.  He was observed during today's session, however, to be repetitive and perseverative in the content of his speech.  It seemed that he did not recall previously discussing certain details.  However, it could very well be that this level of forgetfulness is normal for age.  More comprehensive testing would be required to determine whether he is also having a memory issue beyond normal aging changes.    Emotional Functioning:  Thomas Burnett denied symptoms of depression or anxiety, but as stated above, was perseverative in speaking about certain things (e.g. pain).  He seemed distressed by his pain level and noted worry about the fact that he does not have as much patience as required in this situation.  He stated that he tries to remind himself to have patience and he uses prayer to help himself cope.  He noted that he is 82 years old and feels as though he has led a good life; he reported being unconcerned about death, but did not endorse suicidal ideation.  However, he continues to feel frustrated by the pain and upset that pain medications are administered on a time-limited basis.  We discussed the possibility of involving palliative care to help him better determine pain management needs, particularly in light of his lack of concern for his physical health at this point.  He was agreeable to that plan.     IMPRESSION:  Thomas Burnett cognitive functioning was intact at a basic level, though some memory trouble was observed, which could be normal for age.  If desired, he could complete a comprehensive neuropsychological assessment as an outpatient to determine whether his memory issues are beyond that expected with normal aging, though such testing is certainly not necessary at this point unless he, his family, or his treatment team feel that it would benefit care.  From an emotional standpoint, he denied major symptoms of mood disruption, but his perseveration on pain and pain management suggests anxiety as a reaction to illness at the level of an adjustment disorder.  During today's session, he was encouraged to work toward understanding the restrictions on administration of pain medicine to help him develop patience.  He did not seem to understand or want to implement that strategy to reduce his anxiety or help him develop patience.  Time was also spent in attempting to help him identify other coping skills to use to help him deal with his current stressors, though he provided excuses as to why those strategies would not work in this scenario.  He seemed to appreciate the neuropsychological consult, as a way to combat loneliness, if nothing else.  Further follow-up could be requested should the treatment team feel that it would be beneficial in informing care.    DIAGNOSES:   Thalamic hemorrhage Adjustment disorder with anxiety  Thomas Burnett, Psy.D.  Clinical Neuropsychologist

## 2016-08-12 NOTE — Progress Notes (Signed)
Subjective/Complaints:  ROS- neg CP, SOB, N/V/D, no abd pain Objective: Vital Signs: Blood pressure (!) 170/65, pulse (!) 49, temperature 98.2 F (36.8 C), temperature source Oral, resp. rate 18, height 5\' 6"  (1.676 m), weight 87.8 kg (193 lb 9 oz), SpO2 97 %. No results found. Results for orders placed or performed during the hospital encounter of 08/02/16 (from the past 72 hour(s))  CBC     Status: Abnormal   Collection Time: 08/10/16  5:32 AM  Result Value Ref Range   WBC 7.3 4.0 - 10.5 K/uL   RBC 4.09 (L) 4.22 - 5.81 MIL/uL   Hemoglobin 12.9 (L) 13.0 - 17.0 g/dL   HCT 39.0 39.0 - 52.0 %   MCV 95.4 78.0 - 100.0 fL   MCH 31.5 26.0 - 34.0 pg   MCHC 33.1 30.0 - 36.0 g/dL   RDW 14.3 11.5 - 15.5 %   Platelets 167 150 - 400 K/uL  Urinalysis, Routine w reflex microscopic (not at [facility])     Status: None   Collection Time: 08/10/16  4:43 PM  Result Value Ref Range   Color, Urine YELLOW YELLOW   APPearance CLEAR CLEAR   Specific Gravity, Urine 1.016 1.005 - 1.030   pH 5.0 5.0 - 8.0   Glucose, UA NEGATIVE NEGATIVE mg/dL   Hgb urine dipstick NEGATIVE NEGATIVE   Bilirubin Urine NEGATIVE NEGATIVE   Ketones, ur NEGATIVE NEGATIVE mg/dL   Protein, ur NEGATIVE NEGATIVE mg/dL   Nitrite NEGATIVE NEGATIVE   Leukocytes, UA NEGATIVE NEGATIVE    Comment: MICROSCOPIC NOT DONE ON URINES WITH NEGATIVE PROTEIN, BLOOD, LEUKOCYTES, NITRITE, OR GLUCOSE <1000 mg/dL.  Culture, Urine     Status: None   Collection Time: 08/10/16  4:43 PM  Result Value Ref Range   Specimen Description URINE, RANDOM    Special Requests NONE    Culture NO GROWTH    Report Status 08/11/2016 FINAL       General: No acute distress Mood and affect are appropriate Heart: Regular rate and rhythm no rubs murmurs or extra sounds, - JVD Lungs: Clear to auscultation, breathing unlabored, no rales or wheezes Abdomen: Positive bowel sounds, soft nontender to palpation, nondistended Extremities: No clubbing, cyanosis, Left  pedal edema  Neurologic: Cranial nerves II through XII intact, motor strength is 5/5 in R deltoid, bicep, tricep, grip, hip flexor, knee extensors, ankle dorsiflexor and plantar flexor, 4/5 on left  Sensory exam normal sensation to light touch  bilateral upper and lower extremities Cerebellar exam normal finger to nose to finger as well as heel to shin in bilateral upperextremities Musculoskeletal: Pain with left hip abd , int/ext rotation, limited ROM oth hips, no pain to palp over knee, no effusion, no pain with ROM, Pedal edema, but no ankle or foot pain with palp or ROM, neg Homans, neg SLR   Assessment/Plan: 1. 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 Physiatrist is providing close team supervision and 24 hour management of active medical problems listed below. Physiatrist and rehab team continue to assess barriers to discharge/monitor patient progress toward functional and medical goals. FIM: Function - Bathing Position:  (STEADY at sink) Body parts bathed by patient: Right arm, Left arm, Chest, Abdomen (Declined LB bathing) Body parts bathed by helper: Back Bathing not applicable: Buttocks, Right upper leg, Left upper leg, Right lower leg, Left lower leg Assist Level: 2 helpers  Function- Upper Body Dressing/Undressing What is  the patient wearing?: Pull over shirt/dress, Button up shirt Pull over shirt/dress - Perfomed by patient: Thread/unthread right sleeve, Thread/unthread left sleeve, Put head through opening Pull over shirt/dress - Perfomed by helper: Pull shirt over trunk Button up shirt - Perfomed by helper: Thread/unthread right sleeve, Thread/unthread left sleeve, Pull shirt around back, Button/unbutton shirt Assist Level:  (Min assist) Function - Lower Body Dressing/Undressing What is the patient wearing?: Pants Position: Other (comment) (Recliner) Pants-  Performed by patient: Pull pants up/down Pants- Performed by helper: Thread/unthread right pants leg, Thread/unthread left pants leg, Pull pants up/down, Fasten/unfasten pants Non-skid slipper socks- Performed by helper: Don/doff right sock, Don/doff left sock Assist for footwear: Maximal assist Assist for lower body dressing: 2 Helpers  Function - Toileting Toileting steps completed by patient: Adjust clothing prior to toileting, Performs perineal hygiene, Adjust clothing after toileting (for use of urinal) Toileting steps completed by helper: Adjust clothing prior to toileting, Performs perineal hygiene, Adjust clothing after toileting Toileting Assistive Devices:  (stedy) Assist level: Two helpers  Function - Air cabin crew transfer assistive device: Bedside commode Mechanical lift: Stedy Assist level to toilet: Maximal assist (Pt 25 - 49%/lift and lower) Assist level from toilet: Maximal assist (Pt 25 - 49%/lift and lower) Assist level to bedside commode (at bedside): 2 helpers Assist level from bedside commode (at bedside): 2 helpers  Function - Chair/bed transfer Chair/bed transfer method: Other Chair/bed transfer assist level: dependent (Pt equals 0%) (min assist for sit<>stand from STEDY) Chair/bed transfer assistive device: Armrests Mechanical lift: Stedy Chair/bed transfer details: Manual facilitation for weight shifting, Manual facilitation for placement, Verbal cues for safe use of DME/AE, Verbal cues for technique, Verbal cues for sequencing  Function - Locomotion: Wheelchair Will patient use wheelchair at discharge?: Yes Type: Manual Max wheelchair distance: 50 Assist Level: Moderate assistance (Pt 50 - 74%) Assist Level: Moderate assistance (Pt 50 - 74%) Assist Level: Dependent (Pt equals 0%) Turns around,maneuvers to table,bed, and toilet,negotiates 3% grade,maneuvers on rugs and over doorsills: No Function - Locomotion: Ambulation Ambulation activity did  not occur: Safety/medical concerns (per report) Assistive device: Clarise Cruz plus Max distance: 10+10 Assist level: 2 helpers Walk 10 feet activity did not occur: Safety/medical concerns Assist level: 2 helpers Walk 50 feet with 2 turns activity did not occur: Safety/medical concerns Walk 150 feet activity did not occur: Safety/medical concerns Walk 10 feet on uneven surfaces activity did not occur: Safety/medical concerns  Function - Comprehension Comprehension: Auditory Comprehension assist level: Understands basic 90% of the time/cues < 10% of the time  Function - Expression Expression: Verbal Expression assist level: Expresses basic needs/ideas: With extra time/assistive device  Function - Social Interaction Social Interaction assist level: Interacts appropriately 90% of the time - Needs monitoring or encouragement for participation or interaction.  Function - Problem Solving Problem solving assist level: Solves basic 90% of the time/requires cueing < 10% of the time  Function - Memory Memory assist level: Recognizes or recalls 75 - 89% of the time/requires cueing 10 - 24% of the time Patient normally able to recall (first 3 days only): Current season, That he or she is in a hospital, Staff names and faces  Medical Problem List and Plan: 1.  Weakness, gait instability secondary to right internal capsule/thalamic hemorrhage. Left hemiataxia, cont CIR PT, OT                           2.  DVT Prophylaxis/Anticoagulation: Pharmaceutical: Lovenox, eventual plan for  Eliquis 3. Pain Management: PRN tylenol, On tramadol , twill trial low dose oxycontin 10mg  QHS aspercreme 4. Mood: LCSW to follow for evaluation and support. Ask Chaplain to visit 5. Neuropsych: This patient is capable of making decisions on his own behalf. 6. Skin/Wound Care: Routine pressure relief measures.              Will consider santly to left elbow wound.  7. Fluids/Electrolytes/Nutrition: Encourage fluid intake and  offer supplements between meals. Monitor I/O. Check lytes in am.  8. [id number] ON Norvasc, Lisinopril and Lasix daily. SBP goal<160.  Vitals:   08/11/16 1318 08/12/16 0556  BP: (!) 117/45 (!) 170/65  Pulse: 63 (!) 49  Resp: 18 18  Temp: 98.3 F (36.8 C) 98.2 F (36.8 C)    9. [id number] HR bid--has been controlled.  10. Pre-renal azotemia: Encourage po fluids. Add supplements between meals. Elevated BUN and creat  Stop prinivil and lasix , recheck BMET later in week  11. Chronic insomnia: sleeps in a recliner at nights. Pt states this is due to nasal congestion will try ocean nasal spray 12. Hyperlipidemia: Pravastatin added this admission.  13.  Left hip pain severe  OA on xray, start tramadol during the day and Tylenol with codeine at noc, prn muscle rub  LOS (Days) 10 A FACE TO FACE EVALUATION WAS PERFORMED  Aleka Twitty E 08/12/2016, 8:15 AM

## 2016-08-12 NOTE — Progress Notes (Signed)
Physical Therapy Session Note  Patient Details  Name: Thomas Burnett MRN: [id number] Date of Birth: 06-16-1920  Today's Date: 08/12/2016 PT Individual Time: [id number] PT Individual Time Calculation (min): 38 min    Short Term Goals: Week 2:  PT Short Term Goal 1 (Week 2): Pt will transfer with +1 assist using LRAD PT Short Term Goal 2 (Week 2): Pt will propel w/c 50' using R hemi-technique PT Short Term Goal 3 (Week 2): Pt will demonstrate alternating attention x5 minutes with min cues  Skilled Therapeutic Interventions/Progress Updates:    Pt resting in recliner on arrival, no c/o pain, and agreeable to therapy session.  Session focus on transfers and activity tolerance and NMR.  Pt transitioned from recliner to w/c with stedy and mod assist.  Squat/pivot w/c<>nustep with +2 assist.  Nustep x7 minutes with 4 extremities at level 1 for activity tolerance, reciprocal stepping, and forced use of L side.  Pt returned to room at end of session and transferred to [facility] with stedy.  Handoff to NT.    Therapy Documentation Precautions:  Precautions Precautions: Fall Precaution Comments: Lt hemiparesis and ataxia Restrictions Weight Bearing Restrictions: No   See Function Navigator for Current Functional Status.   Therapy/Group: Individual Therapy  [name] 08/12/2016, 4:00 PM

## 2016-08-12 NOTE — Progress Notes (Signed)
Social Work Patient ID: Thomas Burnett, male   DOB: Aug 06, 1920, 82 y.o.   MRN: [id number]   Met with pt and he feels ready to go to another place and feels it will be better than here. He is still experiencing quite a bit of pain and is aware MD has prescribed him stronger pain meds. Wife in room and feels we need to go with this and begin pursuing next venue.

## 2016-08-12 NOTE — Progress Notes (Signed)
Patient calls out frequently for pain medication. Medication given this am 0741. Muscle  Rub left hip and other areas done. Will give medication as needed to stay on top of his pain charge nurse aware .

## 2016-08-12 NOTE — Progress Notes (Signed)
Physical Therapy Session Note  Patient Details  Name: Thomas Burnett MRN: [id number] Date of Birth: 1920/09/25  Today's Date: 08/12/2016 PT Individual Time: 0900-0920 PT Individual Time Calculation (min): 20 min  and Today's Date: 08/12/2016 PT Missed Time: 40 Minutes Missed Time Reason: Pain    Short Term Goals: Week 2:  PT Short Term Goal 1 (Week 2): Pt will transfer with +1 assist using LRAD PT Short Term Goal 2 (Week 2): Pt will propel w/c 50' using R hemi-technique PT Short Term Goal 3 (Week 2): Pt will demonstrate alternating attention x5 minutes with min cues  Skilled Therapeutic Interventions/Progress Updates:    Pt finishing breakfast tray on PT arrival, perseverating on pain in LLE and declining therapy.  Pt states, "when you wait for 2 and a half hours for relief it's exhausting."  Attempted to redirect patient, providing education on goals of therapy and encouraging participation, but pt continued to decline. Provided pt with apple sauce, per request, and encouraged pt to attempt to open plastic spoon himself, which patient attempted for 1 minute before stating "I'm not going to have to do this at home, you know, so I'm not going to do it here."  Pt continued to decline therapy 2/2 pain and fatigue, so left positioned in recliner with call bell in reach and needs met.   Therapy Documentation Precautions:  Precautions Precautions: Fall Precaution Comments: Lt hemiparesis and ataxia Restrictions Weight Bearing Restrictions: No   See Function Navigator for Current Functional Status.   Therapy/Group: Individual Therapy  Lisandro Meggett E Penven-Crew 08/12/2016, 9:23 AM

## 2016-08-12 NOTE — Progress Notes (Signed)
Occupational Therapy Session Note  Patient Details  Name: Thomas Burnett MRN: [id number] Date of Birth: August 29, 1920  Today's Date: 08/12/2016 OT Individual Time: 1100-1200 OT Individual Time Calculation (min): 60 min     Short Term Goals: Week 1:  OT Short Term Goal 1 (Week 1): Pt will complete LB dressing with max assist of one caregiver OT Short Term Goal 1 - Progress (Week 1): Progressing toward goal OT Short Term Goal 2 (Week 1): Pt will complete bathing with mod assist of one caregiver OT Short Term Goal 2 - Progress (Week 1): Progressing toward goal OT Short Term Goal 3 (Week 1): Pt will complete squat pivot transfer to toilet with max assist of one caregiver OT Short Term Goal 3 - Progress (Week 1): Progressing toward goal OT Short Term Goal 4 (Week 1): Pt will complet sit > stand with max assist of one caregiver to decrease burden of care with self-care tasks OT Short Term Goal 4 - Progress (Week 1): Met Week 2:  OT Short Term Goal 1 (Week 2): Pt will complete LB dressing with max assist of one caregiver at sit > stand level OT Short Term Goal 2 (Week 2): Pt will complete bathing with mod assist of one caregiver at sit > stand level OT Short Term Goal 3 (Week 2): Pt will complete squat pivot transfer to toilet with max assist of one caregiver OT Short Term Goal 4 (Week 2): Pt will utilize LUE as gross assist during self-care tasks with <10% cues  Skilled Therapeutic Interventions/Progress Updates:    1:1 self care retraining at shower level. Pt in the recliner when arrived. Pt transferred from recliner to [facility] in shower via STEDY with mod A for sit to stand with counting cue for timing assistance. Pt enjoyed the shower and incorporated his left UE automatically in shower to assist with bathing; decr amt of cuing. Pt continues to require and request frequent rest breaks. Pt continues to request two people 9one on each side of pt to assist with sit to stands but only mod A of one  person is needed from an elevated height. Pt returned to recliner to dress with more than reasonable amt of itme and encouragement for use of right hand and for forward flexion to assist with LB dressing. LEft in recliner with caregiver and wife in room.   Therapy Documentation Precautions:  Precautions Precautions: Fall Precaution Comments: Lt hemiparesis and ataxia Restrictions Weight Bearing Restrictions: No   Pain: Pain Assessment Pain Assessment: No/denies pain (eating states has no pain at this time) Pain Score: 8  Pain Type: Chronic pain Pain Location: Generalized Pain Orientation:  (all over legs hips etc) Pain Descriptors / Indicators: Aching;Discomfort Pain Frequency: Constant Pain Onset: On-going Patients Stated Pain Goal: 3 Pain Intervention(s): Medication (See eMAR)  See Function Navigator for Current Functional Status.   Therapy/Group: Individual Therapy  Willeen Cass [facility] 08/12/2016, 1:17 PM

## 2016-08-12 NOTE — Progress Notes (Signed)
Physical Therapy Session Note  Patient Details  Name: Thomas Burnett MRN: [id number] Date of Birth: 05/23/1920  Today's Date: 08/12/2016 PT Individual Time: 1330-1400 PT Individual Time Calculation (min): 30 min    Short Term Goals: Week 2:  PT Short Term Goal 1 (Week 2): Pt will transfer with +1 assist using LRAD PT Short Term Goal 2 (Week 2): Pt will propel w/c 50' using R hemi-technique PT Short Term Goal 3 (Week 2): Pt will demonstrate alternating attention x5 minutes with min cues  Skilled Therapeutic Interventions/Progress Updates:   Pt received seated in recliner, denies pain when asked however does later report chronic arthritis throughout LLE. Sit >stand in stedy with modA. Sit <>stand x3 reps from stedy seat with dynamic standing weight shifts; mod cues for trunk/hip extension for upright posture. Returned to sitting due to pt report of discomfort sitting in stedy seat. In recliner, scooting back in seat with modA from therapist for weight shift. All activities require increased time due to pt report of pain and discomfort, needing to "go nice and easy", and pt hyperverbal. Remained seated in recliner with LEs elevated, BUEs on pillows and all needs in reach at end of session.   Therapy Documentation Precautions:  Precautions Precautions: Fall Precaution Comments: Lt hemiparesis and ataxia Restrictions Weight Bearing Restrictions: No   See Function Navigator for Current Functional Status.   Therapy/Group: Individual Therapy  Luberta Mutter 08/12/2016, 1:51 PM

## 2016-08-13 ENCOUNTER — Inpatient Hospital Stay (HOSPITAL_COMMUNITY): Payer: Medicare Other | Admitting: Physical Therapy

## 2016-08-13 ENCOUNTER — Inpatient Hospital Stay (HOSPITAL_COMMUNITY): Payer: Medicare Other | Admitting: Occupational Therapy

## 2016-08-13 LAB — BASIC METABOLIC PANEL
Anion gap: 9 (ref 5–15)
BUN: 31 mg/dL — AB (ref 6–20)
CALCIUM: 8.9 mg/dL (ref 8.9–10.3)
CO2: 23 mmol/L (ref 22–32)
CREATININE: 1.48 mg/dL — AB (ref 0.61–1.24)
Chloride: 105 mmol/L (ref 101–111)
GFR calc Af Amer: 45 mL/min — ABNORMAL LOW (ref >60)
GFR, EST NON AFRICAN AMERICAN: 39 mL/min — AB (ref >60)
GLUCOSE: 112 mg/dL — AB (ref 65–99)
Potassium: 4.1 mmol/L (ref 3.5–5.1)
SODIUM: 137 mmol/L (ref 135–145)

## 2016-08-13 NOTE — Progress Notes (Signed)
Occupational Therapy Session Note  Patient Details  Name: Thomas Burnett MRN: [id number] Date of Birth: 01-25-1920  Today's Date: 08/13/2016 OT Individual Time: 1130-1200 OT Individual Time Calculation (min): 30 min     Short Term Goals: Week 1:  OT Short Term Goal 1 (Week 1): Pt will complete LB dressing with max assist of one caregiver OT Short Term Goal 1 - Progress (Week 1): Progressing toward goal OT Short Term Goal 2 (Week 1): Pt will complete bathing with mod assist of one caregiver OT Short Term Goal 2 - Progress (Week 1): Progressing toward goal OT Short Term Goal 3 (Week 1): Pt will complete squat pivot transfer to toilet with max assist of one caregiver OT Short Term Goal 3 - Progress (Week 1): Progressing toward goal OT Short Term Goal 4 (Week 1): Pt will complet sit > stand with max assist of one caregiver to decrease burden of care with self-care tasks OT Short Term Goal 4 - Progress (Week 1): Met Week 2:  OT Short Term Goal 1 (Week 2): Pt will complete LB dressing with max assist of one caregiver at sit > stand level OT Short Term Goal 2 (Week 2): Pt will complete bathing with mod assist of one caregiver at sit > stand level OT Short Term Goal 3 (Week 2): Pt will complete squat pivot transfer to toilet with max assist of one caregiver OT Short Term Goal 4 (Week 2): Pt will utilize LUE as gross assist during self-care tasks with <10% cues  Skilled Therapeutic Interventions/Progress Updates:    Ppt sitting in recliner upon OT arrival.  Asked ppt to sit upright in chair.  He stated that he was more comfortable in reclined position and asked if he could remain.  He reported his back would ache more if he sat up.  Pt performed isometric contract relax exercises with LUE in all planes in recliner.  He reported he was an avid work out at [employer] person before he had the stroke.  Wife, [name] arrived at the end of session.  Left pt in room with all needs in reach.  Therapy  Documentation Precautions:  Precautions Precautions: Fall Precaution Comments: Lt hemiparesis and ataxia Restrictions Weight Bearing Restrictions: No General:   Vital Signs: Therapy Vitals Temp: 98.2 F (36.8 C) Temp Source: Oral Pulse Rate: (!) 45 Resp: 18 BP: 132/72 Patient Position (if appropriate): Sitting Oxygen Therapy SpO2: 96 % O2 Device: Not Delivered Pain: Pain Assessment Faces Pain Scale: Hurts a little bit Pain Type: Chronic pain Pain Location: Generalized (left side) Pain Orientation: Left Pain Intervention(s): Medication (See eMAR) ADL:   See Function Navigator for Current Functional Status.   Therapy/Group: Individual Therapy  Lisa Roca 08/13/2016, 3:06 PM

## 2016-08-13 NOTE — Progress Notes (Signed)
Physical Therapy Session Note  Patient Details  Name: Thomas Burnett MRN: [id number] Date of Birth: December 25, 1919  Today's Date: 08/13/2016 PT Individual Time: 9563-8756 PT Individual Time Calculation (min): 77 min    Short Term Goals: Week 2:  PT Short Term Goal 1 (Week 2): Pt will transfer with +1 assist using LRAD PT Short Term Goal 2 (Week 2): Pt will propel w/c 50' using R hemi-technique PT Short Term Goal 3 (Week 2): Pt will demonstrate alternating attention x5 minutes with min cues  Skilled Therapeutic Interventions/Progress Updates:   Pt presented in chair agreeable to therapy.  Chair to w/c transfer using Stedy, Pt able to pull self up with encouragement. Attempted stand pivot with RW however pt not agreeable.  Performed stand pivot with modA x 2 to mat.  Verbal cues for glute activation, knee extension and posture. Pt able to achieve upright posture with cues. Sat at [facility] x 12 minutes facing mirror with occasional tactile cues for posture correction.  Pt with occasional increased lean to R or slouch.  Pt performed stand pivot in same manner to return to chair.  Pt required extensive therapeutic break 2/2 "muscular pain" per pt and desire to take breaks. Pt request to use NuStep as previous day. Stand pivot in same manner however pt more receptive to cues for facilitation of transfer. Once on NuStep pt performed minimal range 2/2 c/o hip pain, moreso when hip in flexed position.  Returned to w/c after approx 5 min as unable to tolerate.  Pt performed stand pivot modA x 2 however with improved technique and decreased cues. Pt transferred from w/c to recliner via Stedy and left in room with all needs met.   Therapy Documentation Precautions:  Precautions Precautions: Fall Precaution Comments: Lt hemiparesis and ataxia Restrictions Weight Bearing Restrictions: No General:   Vital Signs: Therapy Vitals Temp: 98.2 F (36.8 C) Temp Source: Oral Pulse Rate: (!) 45 Resp: 18 BP:  132/72 Patient Position (if appropriate): Sitting Oxygen Therapy SpO2: 96 % O2 Device: Not Delivered Pain: Pain Assessment Faces Pain Scale: Hurts a little bit Pain Type: Chronic pain Pain Location: Generalized (left side) Pain Orientation: Left Pain Intervention(s): Medication (See eMAR) Mobility:   Locomotion :    Trunk/Postural Assessment :    Balance:   Exercises:   Other Treatments:     See Function Navigator for Current Functional Status.   Therapy/Group: Individual Therapy  Eaton Folmar  Lashaun Poch, PTA  08/13/2016, 4:21 PM

## 2016-08-13 NOTE — Progress Notes (Signed)
Occupational Therapy Session Note  Patient Details  Name: Thomas Burnett MRN: [id number] Date of Birth: Jun 18, 1920  Today's Date: 08/13/2016 OT Individual Time: 0945-1100 OT Individual Time Calculation (min): 75 min     Short Term Goals:Week 2:  OT Short Term Goal 1 (Week 2): Pt will complete LB dressing with max assist of one caregiver at sit > stand level OT Short Term Goal 2 (Week 2): Pt will complete bathing with mod assist of one caregiver at sit > stand level OT Short Term Goal 3 (Week 2): Pt will complete squat pivot transfer to toilet with max assist of one caregiver OT Short Term Goal 4 (Week 2): Pt will utilize LUE as gross assist during self-care tasks with <10% cues  Skilled Therapeutic Interventions/Progress Updates:    Pt seen for OT ADL bathing/dressing session. Pt in supine upon arrival, agreeable to tx session with encouragement. With encouragement, pt agreeable to shower level bathing. He transferred to EOB with max A and considerably increased time.  STEADY used for transfer to [facility] for showering task. Assist required for LB bathing and buttock hygiene. Pt able to automatically use L UE, however, when not in use he required VCs for attention to placement of UE as he would be sitting on it or have it trapped btwn BSC and wall.  He transferred out of shower and dressed seated in w/c. Attempted to button button up shirt, however, due to L UE incoordination and pt being easily distracted was unable to complete task. LB dressing completed with total A due to time. Pt returned to recliner and left with all needs in reach. Rest breaks required throughout bathing/dressing session due to decreased functional activity tolerance.  Educated throughout session regarding role of OT, and importance of participation and independence with ADLs.   Therapy Documentation Precautions:  Precautions Precautions: Fall Precaution Comments: Lt hemiparesis and ataxia Restrictions Weight  Bearing Restrictions: No  See Function Navigator for Current Functional Status.   Therapy/Group: Individual Therapy  Lewis, Brinlyn Cena C 08/13/2016, 7:08 AM

## 2016-08-13 NOTE — Progress Notes (Signed)
Patient sleeping sound during night. Check on pt. Every 2 hrs. Stated earlier at [facility] not to wake him to give any pain meds during night. Sitter at bedside. Continue to monitor.

## 2016-08-13 NOTE — Progress Notes (Signed)
 Subjective/Complaints: No issues overnite Urine normal  ROS- neg CP, SOB, N/V/D, no abd pain Objective: Vital Signs: Blood pressure (!) 109/52, pulse (!) 55, temperature 97.8 F (36.6 C), temperature source Oral, resp. rate 19, height 5' 6" (1.676 m), weight 87.8 kg (193 lb 9 oz), SpO2 96 %. No results found. Results for orders placed or performed during the hospital encounter of 08/02/16 (from the past 72 hour(s))  Urinalysis, Routine w reflex microscopic (not at [facility])     Status: None   Collection Time: 08/10/16  4:43 PM  Result Value Ref Range   Color, Urine YELLOW YELLOW   APPearance CLEAR CLEAR   Specific Gravity, Urine 1.016 1.005 - 1.030   pH 5.0 5.0 - 8.0   Glucose, UA NEGATIVE NEGATIVE mg/dL   Hgb urine dipstick NEGATIVE NEGATIVE   Bilirubin Urine NEGATIVE NEGATIVE   Ketones, ur NEGATIVE NEGATIVE mg/dL   Protein, ur NEGATIVE NEGATIVE mg/dL   Nitrite NEGATIVE NEGATIVE   Leukocytes, UA NEGATIVE NEGATIVE    Comment: MICROSCOPIC NOT DONE ON URINES WITH NEGATIVE PROTEIN, BLOOD, LEUKOCYTES, NITRITE, OR GLUCOSE <1000 mg/dL.  Culture, Urine     Status: None   Collection Time: 08/10/16  4:43 PM  Result Value Ref Range   Specimen Description URINE, RANDOM    Special Requests NONE    Culture NO GROWTH    Report Status 08/11/2016 FINAL   Basic metabolic panel     Status: Abnormal   Collection Time: 08/12/16 11:41 PM  Result Value Ref Range   Sodium 137 135 - 145 mmol/L   Potassium 4.1 3.5 - 5.1 mmol/L   Chloride 105 101 - 111 mmol/L   CO2 23 22 - 32 mmol/L   Glucose, Bld 112 (H) 65 - 99 mg/dL   BUN 31 (H) 6 - 20 mg/dL   Creatinine, Ser 1.48 (H) 0.61 - 1.24 mg/dL   Calcium 8.9 8.9 - 10.3 mg/dL   GFR calc non Af Amer 39 (L) >60 mL/min   GFR calc Af Amer 45 (L) >60 mL/min    Comment: (NOTE) The eGFR has been calculated using the CKD EPI equation. This calculation has not been validated in all clinical situations. eGFR's persistently <60 mL/min signify possible Chronic  Kidney Disease.    Anion gap 9 5 - 15      General: No acute distress Mood and affect are appropriate Heart: Regular rate and rhythm no rubs murmurs or extra sounds, - JVD Lungs: Clear to auscultation, breathing unlabored, no rales or wheezes Abdomen: Positive bowel sounds, soft nontender to palpation, nondistended Extremities: No clubbing, cyanosis, Left pedal edema  Neurologic: Cranial nerves II through XII intact, motor strength is 5/5 in R deltoid, bicep, tricep, grip, hip flexor, knee extensors, ankle dorsiflexor and plantar flexor, 4/5 on left  Sensory exam normal sensation to light touch  bilateral upper and lower extremities Cerebellar exam normal finger to nose to finger as well as heel to shin in bilateral upperextremities Musculoskeletal: Pain with left hip abd , int/ext rotation, limited ROM oth hips, no pain to palp over knee, no effusion, no pain with ROM, Pedal edema, but no ankle or foot pain with palp or ROM, neg Homans, neg SLR   Assessment/Plan: 1. 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 Physiatrist is providing close team supervision and 24 hour management of active medical problems listed below. Physiatrist and rehab team continue to assess barriers   to discharge/monitor patient progress toward functional and medical goals. FIM: Function - Bathing Position: Shower Body parts bathed by patient: Right arm, Left arm, Chest, Abdomen, Right upper leg, Left upper leg, Front perineal area Body parts bathed by helper: Buttocks, Right lower leg, Left lower leg, Back Bathing not applicable: Buttocks, Right upper leg, Left upper leg, Right lower leg, Left lower leg Assist Level: Touching or steadying assistance(Pt > 75%)  Function- Upper Body Dressing/Undressing What is the patient wearing?: Button up shirt Pull over shirt/dress - Perfomed by patient:  Thread/unthread right sleeve, Thread/unthread left sleeve, Put head through opening Pull over shirt/dress - Perfomed by helper: Pull shirt over trunk Button up shirt - Perfomed by patient: Thread/unthread left sleeve, Pull shirt around back Button up shirt - Perfomed by helper: Thread/unthread right sleeve, Button/unbutton shirt Assist Level: Touching or steadying assistance(Pt > 75%) Function - Lower Body Dressing/Undressing What is the patient wearing?: Pants, Non-skid slipper socks Position: Other (comment) Pants- Performed by patient: Thread/unthread right pants leg Pants- Performed by helper: Thread/unthread left pants leg, Pull pants up/down Non-skid slipper socks- Performed by helper: Don/doff right sock, Don/doff left sock Assist for footwear: Maximal assist Assist for lower body dressing: Touching or steadying assistance (Pt > 75%)  Function - Toileting Toileting activity did not occur: No continent bowel/bladder event Toileting steps completed by patient: Adjust clothing prior to toileting, Performs perineal hygiene, Adjust clothing after toileting (for use of urinal) Toileting steps completed by helper: Adjust clothing prior to toileting, Performs perineal hygiene, Adjust clothing after toileting Toileting Assistive Devices:  (stedy) Assist level: Two helpers  Function - Toilet Transfers Toilet transfer assistive device: Bedside commode Mechanical lift: Stedy Assist level to toilet: Moderate assist (Pt 50 - 74%/lift or lower) Assist level from toilet: Moderate assist (Pt 50 - 74%/lift or lower) Assist level to bedside commode (at bedside): 2 helpers Assist level from bedside commode (at bedside): 2 helpers  Function - Chair/bed transfer Chair/bed transfer method: Squat pivot Chair/bed transfer assist level: 2 helpers Chair/bed transfer assistive device: Armrests Mechanical lift: Stedy Chair/bed transfer details: Manual facilitation for weight shifting, Manual facilitation  for placement, Verbal cues for safe use of DME/AE, Verbal cues for technique, Verbal cues for sequencing  Function - Locomotion: Wheelchair Will patient use wheelchair at discharge?: Yes Type: Manual Max wheelchair distance: 50 Assist Level: Moderate assistance (Pt 50 - 74%) Assist Level: Moderate assistance (Pt 50 - 74%) Assist Level: Dependent (Pt equals 0%) Turns around,maneuvers to table,bed, and toilet,negotiates 3% grade,maneuvers on rugs and over doorsills: No Function - Locomotion: Ambulation Ambulation activity did not occur: Safety/medical concerns (per report) Assistive device: Sara plus Max distance: 10+10 Assist level: 2 helpers Walk 10 feet activity did not occur: Safety/medical concerns Assist level: 2 helpers Walk 50 feet with 2 turns activity did not occur: Safety/medical concerns Walk 150 feet activity did not occur: Safety/medical concerns Walk 10 feet on uneven surfaces activity did not occur: Safety/medical concerns  Function - Comprehension Comprehension: Auditory Comprehension assist level: Understands basic 90% of the time/cues < 10% of the time  Function - Expression Expression: Verbal Expression assist level: Expresses basic needs/ideas: With extra time/assistive device  Function - Social Interaction Social Interaction assist level: Interacts appropriately 90% of the time - Needs monitoring or encouragement for participation or interaction.  Function - Problem Solving Problem solving assist level: Solves basic 90% of the time/requires cueing < 10% of the time  Function - Memory Memory assist level: Recognizes or recalls 50 - 74% of   the time/requires cueing 25 - 49% of the time Patient normally able to recall (first 3 days only): Current season, That he or she is in a hospital, Staff names and faces  Medical Problem List and Plan: 1.  Weakness, gait instability secondary to right internal capsule/thalamic hemorrhage. Left hemiataxia, cont CIR PT, OT                            2.  DVT Prophylaxis/Anticoagulation: Pharmaceutical: Lovenox, eventual plan for Eliquis 3. Pain Management: PRN tylenol, On tramadol , will trial low dose oxycontin 15mg BID aspercreme 4. Mood: LCSW to follow for evaluation and support. Ask Chaplain to visit 5. Neuropsych: This patient is capable of making decisions on his own behalf. 6. Skin/Wound Care: Routine pressure relief measures.              Will consider santly to left elbow wound.  7. Fluids/Electrolytes/Nutrition: Encourage fluid intake and offer supplements between meals. Monitor I/O. Check lytes in am.  8. HTN:Monitor:-continue ON Norvasc, Lisinopril and Lasix daily. SBP goal<160.  Vitals:   08/13/16 0515 08/13/16 0810  BP: (!) 146/106 (!) 109/52  Pulse: 60 (!) 55  Resp: 19   Temp: 97.8 F (36.6 C)     9. CAF:Monitor HR bid--has been controlled.  10. Pre-renal azotemia: Encourage po fluids. Add supplements between meals. Elevated BUN and creat  Stop prinivil and lasix , recheck BMET later in week  11. Chronic insomnia: sleeps in a recliner at nights. Pt states this is due to nasal congestion will try ocean nasal spray 12. Hyperlipidemia: Pravastatin added this admission.  13.  Left hip pain severe  OA on xray, prn tramadol , Oxy CR 15mg BID, monitor sedation LOS (Days) 11 A FACE TO FACE EVALUATION WAS PERFORMED  , E 08/13/2016, 8:19 AM   

## 2016-08-13 NOTE — Progress Notes (Signed)
Social Work [name], LCSW Social Worker Signed   Patient Care Conference Date of Service: 08/11/2016  1:37 PM      Hide copied text Hover for attribution information Inpatient RehabilitationTeam Conference and Plan of Care Update Date: 08/11/2016   Time: 10:30 AM      Patient Name: Thomas Burnett      Medical Record Number: [id number]  Date of Birth: [birth year] Sex: Male         Room/Bed: 4W24C/4W24C-01 Payor Info: Payor: MEDICARE / Plan: MEDICARE PART A AND B / Product Type: *No Product type* /     Admitting Diagnosis: CVA  Admit Date/Time:  08/02/2016  3:33 PM Admission Comments: No comment available    Primary Diagnosis:  <principal problem not specified> Principal Problem: <principal problem not specified>       Patient Active Problem List    Diagnosis Date Noted  . Hypertensive emergency 08/02/2016  . Chronic a-fib ([place]) 08/02/2016  . Hyperlipidemia 08/02/2016  . Obesity 08/02/2016  . Prostate cancer ([place]) 08/02/2016  . Right bundle branch block 08/02/2016  . Nontraumatic thalamic hemorrhage ([place]) 08/02/2016  . Gait disturbance, post-stroke    . Left hemiparesis ([place])    . Open wound    . Benign essential HTN    . Chronic atrial fibrillation ([place])    . Prerenal azotemia    . Primary insomnia    . Mixed hyperlipidemia    . ICH (intracerebral hemorrhage) ([place]) - small R thalamic d/t HTN 07/28/2016      Expected Discharge Date: Expected Discharge Date: 08/23/16   Team Members Present: Physician leading conference: Dr. Alysia Penna Social Worker Present: Ovidio Kin, LCSW Nurse Present: Heather Roberts, RN PT Present: Dwyane Dee, PT OT Present: Clyda Greener, OT SLP Present: Windell Moulding, SLP PPS Coordinator present : Daiva Nakayama, RN, CRRN       Current Status/Progress Goal Weekly Team Focus  Medical   one person assist, starting to plateau, variable motivation  Nocturnal pain control  adjust meds, concern with potential SEa    Bowel/Bladder   continent bowel and bladder min assist with urinal   min assist   continue to monitor assist needed    Swallow/Nutrition/ Hydration             ADL's   +2 for transfers and any functional standing tasks.   downgraded to mod assist overall  sit > stand, LUE NMR, transfers, ADL retraining, activity tolerance, d/c planning   Mobility   max assist without equipment, min>mod assist for transfers with STEDY  downgraded to mod for transfers and standing balance, d/c car transfer, ambulation goal, mod assist for w/c propulsion for endurance/strengthening  d/c plan to SNF, continue to work on activity tolerance, weight shifts, and transfers    Communication             Safety/Cognition/ Behavioral Observations n/a         Pain   ultram 50 mg po every 6 hours, tylenol #3 at hs poor control per patient  less than 3  increase or change medication for pain and educate on to reposition   Skin   right elbow skin tear abrasion healing to right shin  no new breakdown  educte patient on management of skin care        *See Care Plan and progress notes for long and short-term goals.   Barriers to Discharge: pt inconsistent   Possible Resolutions to Barriers:  Family planning SNF  Discharge Planning/Teaching Needs:  Wife is feeling pt will be too much care for home from rehab is starting to look at other options. She has been here and seen him in therapies.      Team Discussion:  Team feels pt is progressing slowly and if he participates with them in therapies, it is dependent upon how he feels and his pain level. He continues to talk and distract himself in therapies. Wife is planning on NHP but has not told pt wants SW to approach the subject with him.  Revisions to Treatment Plan:  Downgraded goals to mod assist level-probable NHP    Continued Need for Acute Rehabilitation Level of Care: The patient requires daily medical management by a physician with specialized training in physical  medicine and rehabilitation for the following conditions: Daily analysis of laboratory values and/or radiology reports with any subsequent need for medication adjustment of medical intervention for : Neurological problems;Other   Ralph Benavidez, [name] 08/13/2016, 9:00 AM      [name], LCSW Social Worker Signed   Patient Care Conference Date of Service: 08/04/2016  3:28 PM      Hide copied text Hover for attribution information Inpatient RehabilitationTeam Conference and Plan of Care Update Date: 08/04/2016   Time: 11:00 AM      Patient Name: Thomas Burnett      Medical Record Number: [id number]  Date of Birth: May 06, 1920 Sex: Male         Room/Bed: 4W24C/4W24C-01 Payor Info: Payor: MEDICARE / Plan: MEDICARE PART A AND B / Product Type: *No Product type* /     Admitting Diagnosis: CVA  Admit Date/Time:  08/02/2016  3:33 PM Admission Comments: No comment available    Primary Diagnosis:  <principal problem not specified> Principal Problem: <principal problem not specified>       Patient Active Problem List    Diagnosis Date Noted  . Hypertensive emergency 08/02/2016  . Chronic a-fib ([place]) 08/02/2016  . Hyperlipidemia 08/02/2016  . Obesity 08/02/2016  . Prostate cancer ([place]) 08/02/2016  . Right bundle branch block 08/02/2016  . Nontraumatic thalamic hemorrhage ([place]) 08/02/2016  . Gait disturbance, post-stroke    . Left hemiparesis ([place])    . Open wound    . Benign essential HTN    . Chronic atrial fibrillation ([place])    . Prerenal azotemia    . Primary insomnia    . Mixed hyperlipidemia    . ICH (intracerebral hemorrhage) ([place]) - small R thalamic d/t HTN 07/28/2016      Expected Discharge Date: Expected Discharge Date: 08/23/16   Team Members Present: Physician leading conference: Dr. Alysia Penna Social Worker Present: Ovidio Kin, LCSW Nurse Present: Heather Roberts, RN PT Present: Dwyane Dee, PT OT Present: Simonne Come, OT SLP Present: Windell Moulding, SLP PPS Coordinator present : Daiva Nakayama, RN, CRRN       Current Status/Progress Goal Weekly Team Focus  Medical   Left hip pain, OA, 2 person assist,   improve pain control  med management of LE pain   Bowel/Bladder   continent of bowel and bladder  Pt to remain continent of bowel and bladder.   Maintain and monitor continence   Swallow/Nutrition/ Hydration             ADL's   +2 for any transfers or LB self-care tasks  min assist overall, mod assist toileting  sit > stand, LUE NMR, transfers, ADL retraining   Mobility   max>total overall  min  for transfers, supervision w/c, mod on stairs  d/c planning (ramp?), transfers, gait, activity tolerance   Communication             Safety/Cognition/ Behavioral Observations           Pain   Tylenol 650mg  q 6hrs prn for headache  <3  Monitor for nonverbal cues of pain to head as well as LUE/LLE    Skin   Skin tear to R elbow, gauze to area. Abrasion to R shin, OTA  No additonal skin breakdown  Assess skin q shift       *See Care Plan and progress notes for long and short-term goals.   Barriers to Discharge: verbose, distractable, wife has physical limitations   Possible Resolutions to Barriers:  Cont rehab, pt will need hired caregiver at home   Discharge Planning/Teaching Needs:  Wife and pt want him to go home, but wife limited to supervision level. Has hired assist will see if can increase her hours. See how pt progresses.      Team Discussion:  Pt having pain in his hips kept awake last night. X-ray today-showed arthritis in both hips. MD to prescribe pain meds for this. Goals min-mod level of assistance. Distractable in therapies and likes to talk. Has hired caregiver but will see if can manage at projected levels.   Revisions to Treatment Plan:  Pain interfering with therapy participation    Continued Need for Acute Rehabilitation Level of Care: The patient requires daily medical management by a physician with specialized  training in physical medicine and rehabilitation for the following conditions: Daily direction of a multidisciplinary physical rehabilitation program to ensure safe treatment while eliciting the highest outcome that is of practical value to the patient.: Yes Daily medical management of patient stability for increased activity during participation in an intensive rehabilitation regime.: Yes Daily analysis of laboratory values and/or radiology reports with any subsequent need for medication adjustment of medical intervention for : Other;Neurological problems   [name] 08/04/2016, 3:28 PM       Patient ID: Kirke Shaggy, male   DOB: 10/13/1919, 82 y.o.   MRN: [id number]

## 2016-08-14 ENCOUNTER — Inpatient Hospital Stay (HOSPITAL_COMMUNITY): Payer: Medicare Other | Admitting: Occupational Therapy

## 2016-08-14 ENCOUNTER — Inpatient Hospital Stay (HOSPITAL_COMMUNITY): Payer: Medicare Other | Admitting: *Deleted

## 2016-08-14 ENCOUNTER — Inpatient Hospital Stay (HOSPITAL_COMMUNITY): Payer: Medicare Other | Admitting: Physical Therapy

## 2016-08-14 NOTE — Progress Notes (Signed)
Subjective/Complaints: Pt slept ok no new issues, participates in some therapy activities, feels pain control is adequate  ROS- neg CP, SOB, N/V/D, no abd pain Objective: Vital Signs: Blood pressure (!) 161/62, pulse (!) 59, temperature 98.2 F (36.8 C), temperature source Oral, resp. rate 18, height 5' 6"  (1.676 m), weight 87.8 kg (193 lb 9 oz), SpO2 96 %. No results found. Results for orders placed or performed during the hospital encounter of 08/02/16 (from the past 72 hour(s))  Basic metabolic panel     Status: Abnormal   Collection Time: 08/12/16 11:41 PM  Result Value Ref Range   Sodium 137 135 - 145 mmol/L   Potassium 4.1 3.5 - 5.1 mmol/L   Chloride 105 101 - 111 mmol/L   CO2 23 22 - 32 mmol/L   Glucose, Bld 112 (H) 65 - 99 mg/dL   BUN 31 (H) 6 - 20 mg/dL   Creatinine, Ser 1.48 (H) 0.61 - 1.24 mg/dL   Calcium 8.9 8.9 - 10.3 mg/dL   GFR calc non Af Amer 39 (L) >60 mL/min   GFR calc Af Amer 45 (L) >60 mL/min    Comment: (NOTE) The eGFR has been calculated using the CKD EPI equation. This calculation has not been validated in all clinical situations. eGFR's persistently <60 mL/min signify possible Chronic Kidney Disease.    Anion gap 9 5 - 15      General: No acute distress Mood and affect are appropriate Heart: Regular rate and rhythm no rubs murmurs or extra sounds, - JVD Lungs: Clear to auscultation, breathing unlabored, no rales or wheezes Abdomen: Positive bowel sounds, soft nontender to palpation, nondistended Extremities: No clubbing, cyanosis, Left pedal edema  Neurologic: Cranial nerves II through XII intact, motor strength is 5/5 in R deltoid, bicep, tricep, grip, hip flexor, knee extensors, ankle dorsiflexor and plantar flexor, 4/5 on left  Sensory exam normal sensation to light touch  bilateral upper and lower extremities Cerebellar exam normal finger to nose to finger as well as heel to shin in bilateral upperextremities Musculoskeletal: Pain with left  hip abd , int/ext rotation, limited ROM oth hips, no pain to palp over knee, no effusion, no pain with ROM, Pedal edema, but no ankle or foot pain with palp or ROM, neg Homans, neg SLR   Assessment/Plan: 1. 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 Physiatrist is providing close team supervision and 24 hour management of active medical problems listed below. Physiatrist and rehab team continue to assess barriers to discharge/monitor patient progress toward functional and medical goals. FIM: Function - Bathing Position: Shower Body parts bathed by patient: Right arm, Left arm, Chest, Abdomen, Right upper leg, Left upper leg, Front perineal area Body parts bathed by helper: Buttocks, Right lower leg, Left lower leg, Back Bathing not applicable: Buttocks, Right upper leg, Left upper leg, Right lower leg, Left lower leg Assist Level: Touching or steadying assistance(Pt > 75%)  Function- Upper Body Dressing/Undressing What is the patient wearing?: Button up shirt Pull over shirt/dress - Perfomed by patient: Thread/unthread right sleeve Pull over shirt/dress - Perfomed by helper: Pull shirt over trunk Button up shirt - Perfomed by patient: Thread/unthread right sleeve Button up shirt - Perfomed by helper: Thread/unthread left sleeve, Pull shirt around back, Button/unbutton shirt Assist Level: Touching or steadying assistance(Pt > 75%) Function - Lower Body Dressing/Undressing What is the patient wearing?: Pants, Non-skid slipper socks Position: Wheelchair/chair  at sink Pants- Performed by patient: Thread/unthread right pants leg Pants- Performed by helper: Thread/unthread right pants leg, Thread/unthread left pants leg, Pull pants up/down Non-skid slipper socks- Performed by helper: Don/doff right sock, Don/doff left sock Assist for footwear: Maximal assist Assist for lower body  dressing: 2 Helpers  Function - Toileting Toileting activity did not occur: No continent bowel/bladder event Toileting steps completed by patient: Adjust clothing prior to toileting, Performs perineal hygiene, Adjust clothing after toileting (for use of urinal) Toileting steps completed by helper: Adjust clothing prior to toileting, Performs perineal hygiene, Adjust clothing after toileting Toileting Assistive Devices:  (stedy) Assist level: Two helpers  Function - Air cabin crew transfer assistive device: Bedside commode Mechanical lift: Stedy Assist level to toilet: Moderate assist (Pt 50 - 74%/lift or lower) Assist level from toilet: Moderate assist (Pt 50 - 74%/lift or lower) Assist level to bedside commode (at bedside): 2 helpers Assist level from bedside commode (at bedside): 2 helpers  Function - Chair/bed transfer Chair/bed transfer method: Squat pivot Chair/bed transfer assist level: 2 helpers Chair/bed transfer assistive device: Armrests Mechanical lift: Stedy Chair/bed transfer details: Manual facilitation for weight shifting, Manual facilitation for placement, Verbal cues for safe use of DME/AE, Verbal cues for technique, Verbal cues for sequencing  Function - Locomotion: Wheelchair Will patient use wheelchair at discharge?: Yes Type: Manual Max wheelchair distance: 50 Assist Level: Moderate assistance (Pt 50 - 74%) Assist Level: Moderate assistance (Pt 50 - 74%) Assist Level: Dependent (Pt equals 0%) Turns around,maneuvers to table,bed, and toilet,negotiates 3% grade,maneuvers on rugs and over doorsills: No Function - Locomotion: Ambulation Ambulation activity did not occur: Safety/medical concerns (per report) Assistive device: Clarise Cruz plus Max distance: 10+10 Assist level: 2 helpers Walk 10 feet activity did not occur: Safety/medical concerns Assist level: 2 helpers Walk 50 feet with 2 turns activity did not occur: Safety/medical concerns Walk 150 feet  activity did not occur: Safety/medical concerns Walk 10 feet on uneven surfaces activity did not occur: Safety/medical concerns  Function - Comprehension Comprehension: Auditory Comprehension assist level: Understands basic 90% of the time/cues < 10% of the time  Function - Expression Expression: Verbal Expression assist level: Expresses basic needs/ideas: With extra time/assistive device  Function - Social Interaction Social Interaction assist level: Interacts appropriately 90% of the time - Needs monitoring or encouragement for participation or interaction.  Function - Problem Solving Problem solving assist level: Solves basic 75 - 89% of the time/requires cueing 10 - 24% of the time  Function - Memory Memory assist level: Recognizes or recalls 90% of the time/requires cueing < 10% of the time Patient normally able to recall (first 3 days only): Current season, That he or she is in a hospital, Staff names and faces  Medical Problem List and Plan: 1.  Weakness, gait instability secondary to right internal capsule/thalamic hemorrhage. Left hemiataxia, cont CIR PT, OT                           2.  DVT Prophylaxis/Anticoagulation: Pharmaceutical: Lovenox, eventual plan for Eliquis 3. Pain Management: PRN tylenol, On tramadol , will trial low dose oxycontin 58m BID aspercreme 4. Mood: LCSW to follow for evaluation and support. Ask Chaplain to visit 5. Neuropsych: This patient is capable of making decisions on his own behalf. 6. Skin/Wound Care: Routine pressure relief measures.              Will consider santly to left elbow wound.  7. Fluids/Electrolytes/Nutrition:  Encourage fluid intake and offer supplements between meals. Monitor I/O. Check lytes in am.  8. OGA:CGBKORJ:-GYLUDAPT ON Norvasc, Lisinopril and Lasix daily. SBP goal<160.  Vitals:   08/13/16 1357 08/14/16 0432  BP: 132/72 (!) 161/62  Pulse: (!) 45 (!) 59  Resp: 18 18  Temp: 98.2 F (36.8 C) 98.2 F (36.8 C)    9.  CKF:WBLTGAI HR bid--has been controlled.  10. Pre-renal azotemia: Encourage po fluids. Add supplements between meals. Elevated BUN and creat  Stop prinivil and lasix , recheck BMET later in week  11. Chronic insomnia: sleeps in a recliner at nights. Pt states this is due to nasal congestion will try ocean nasal spray 12. Hyperlipidemia: Pravastatin added this admission.  13.  Left hip pain severe  OA on xray, prn tramadol , Oxy CR 43m BID, monitor sedation LOS (Days) 12 A FACE TO FACE EVALUATION WAS PERFORMED  Channa Hazelett E 08/14/2016, 8:48 AM

## 2016-08-14 NOTE — Progress Notes (Signed)
Physical Therapy Session Note  Patient Details  Name: Thomas Burnett MRN: [id number] Date of Birth: [birth year]  Today's Date: 08/14/2016 PT Individual Time: 2883-3744 1400-1440 PT Individual Time Calculation (min): 57 min and 40 min   Short Term Goals: Week 2:  PT Short Term Goal 1 (Week 2): Pt will transfer with +1 assist using LRAD PT Short Term Goal 2 (Week 2): Pt will propel w/c 50' using R hemi-technique PT Short Term Goal 3 (Week 2): Pt will demonstrate alternating attention x5 minutes with min cues  Skilled Therapeutic Interventions/Progress Updates:   Pt presented in chair initially refusing therapy.  Wife present, with encouragement pt agreeable to participate in seated therex "to my comfort".  Pt participated in ankle pumps x 15, heel slides within shortened range x 15 bilaterally, hip abd x12, bilaterally, SAQ x12 bilaterally, LAQ x 12 bilaterally, GS, x15 bilaterally, QS x 15 bilaterally.  Pt required moderate cues for completion of activities and would become easily distracted and emotional at times. Pt request "muscle rub ointment", as indicated ms spasm at ITB.  Per nsg ok to apply, applied with STM to ITB with pt indicating improved relief.  Ended session with all current needs met.   Pt continues to c/o fatigue this pm. Pt agreeable to standing trials after encouragement. Pt performed sit to stand with use of Stedy. Performed standing trials x3 in [place] with extended breaks in between bouts. Pt tolerating standing trials for maximum 30 seconds with verbal and tactile cues to increase glute activation, quad activation in LLE and improved posture. When resting in [place] verbal/tactile cues for decreasing lean to R and improving posture. Pt limited in activity 2/2 fatigue.  Pt remained in chair with all current needs met.   Therapy Documentation Precautions:  Precautions Precautions: Fall Precaution Comments: Lt hemiparesis and ataxia Restrictions Weight Bearing Restrictions:  No General:   Vital Signs:  Pain: Pain Assessment Faces Pain Scale: Hurts even more Pain Type: Chronic pain Pain Location: Generalized Pain Orientation: Left Pain Descriptors / Indicators: Aching;Discomfort Pain Intervention(s): Medication (See eMAR) Mobility:   Locomotion :    Trunk/Postural Assessment :    Balance:   Exercises:   Other Treatments:     See Function Navigator for Current Functional Status.   Therapy/Group: Individual Therapy  Davonne Baby  Yittel Emrich, PTA  08/14/2016, 12:32 PM

## 2016-08-14 NOTE — Progress Notes (Signed)
Occupational Therapy Session Note  Patient Details  Name: Thomas Burnett MRN: [id number] Date of Birth: 1920/03/08  Today's Date: 08/14/2016 OT Individual Time: 1250-1325 OT Individual Time Calculation (min): 35 min     Short Term Goals: Week 1:  OT Short Term Goal 1 (Week 1): Pt will complete LB dressing with max assist of one caregiver OT Short Term Goal 1 - Progress (Week 1): Progressing toward goal OT Short Term Goal 2 (Week 1): Pt will complete bathing with mod assist of one caregiver OT Short Term Goal 2 - Progress (Week 1): Progressing toward goal OT Short Term Goal 3 (Week 1): Pt will complete squat pivot transfer to toilet with max assist of one caregiver OT Short Term Goal 3 - Progress (Week 1): Progressing toward goal OT Short Term Goal 4 (Week 1): Pt will complet sit > stand with max assist of one caregiver to decrease burden of care with self-care tasks OT Short Term Goal 4 - Progress (Week 1): Met Week 2:  OT Short Term Goal 1 (Week 2): Pt will complete LB dressing with max assist of one caregiver at sit > stand level OT Short Term Goal 2 (Week 2): Pt will complete bathing with mod assist of one caregiver at sit > stand level OT Short Term Goal 3 (Week 2): Pt will complete squat pivot transfer to toilet with max assist of one caregiver OT Short Term Goal 4 (Week 2): Pt will utilize LUE as gross assist during self-care tasks with <10% cues  Skilled Therapeutic Interventions/Progress Updates:    Addressed strength, AROm, pressure relief, sit to stand.  Pt sitting in recliner in slouched position.  He stated he needed assistance to get in the chair better.  Provided stedy to position pt in standing and relieve pressure.  Pt was able to pull up with stedy with BUE and mod assist.  Performed grooming at sink. Used LUE with max assist for coordination/ stabilization.   Used 3n1 with urine continence and increased time.  Did sit to stand for peri care and applied cream to  buttocks.  Transferred pt to recliner with stedy and left with all needs in place.   Therapy Documentation Precautions:  Precautions Precautions: Fall Precaution Comments: Lt hemiparesis and ataxia Restrictions Weight Bearing Restrictions: No General:   Vital Signs: Therapy Vitals Temp: 98.6 F (37 C) Temp Source: Oral Pulse Rate: 66 Resp: 18 BP: (!) 152/72 Patient Position (if appropriate): Sitting Oxygen Therapy SpO2: 97 % O2 Device: Not Delivered Pain: Pain Assessment Faces Pain Scale: Hurts a little bit Pain Location: Generalized Pain Orientation: Left (left side/hip) Pain Intervention(s): Medication (See eMAR)      Other Treatments:    See Function Navigator for Current Functional Status.   Therapy/Group: Individual Therapy  Lisa Roca 08/14/2016, 5:17 PM

## 2016-08-15 ENCOUNTER — Inpatient Hospital Stay (HOSPITAL_COMMUNITY): Payer: Medicare Other | Admitting: Physical Therapy

## 2016-08-15 ENCOUNTER — Inpatient Hospital Stay (HOSPITAL_COMMUNITY): Payer: Medicare Other | Admitting: Occupational Therapy

## 2016-08-15 NOTE — Progress Notes (Signed)
Physical Therapy Note  Patient Details  Name: Thomas Burnett MRN: [id number] Date of Birth: Jun 25, 1920 Today's Date: 08/15/2016    Time 1: 1100 Pt refused 60 minute skilled PT session due to c/o fatigue from earlier OT session. Pt educated on importance of mobility, pt continued to refuse.  Time 2: 1345-1423 38 minutes  1:1 No c/o pain at rest. Pt refused attempts to stand but agreeable to therex. Pt performed 3 x 10 LAQ, SAQ, HS curls with theraband, seated and supine hip abd/add, heel slides, marching, ankle pumps.  PT continued to educate pt throughout session on importance of standing and transfer training, pt continued to refuse due to c/o fatigue.   Adelyn Roscher 08/15/2016, 2:28 PM

## 2016-08-15 NOTE — Progress Notes (Addendum)
Occupational Therapy Session Note  Patient Details  Name: Thomas Burnett MRN: [id number] Date of Birth: 09/14/1920  Today's Date: 08/15/2016 OT Individual Time: 9794-8016 OT Individual Time Calculation (min): 63 min   Short Term Goals: Week 1:  OT Short Term Goal 1 (Week 1): Pt will complete LB dressing with max assist of one caregiver OT Short Term Goal 1 - Progress (Week 1): Progressing toward goal OT Short Term Goal 2 (Week 1): Pt will complete bathing with mod assist of one caregiver OT Short Term Goal 2 - Progress (Week 1): Progressing toward goal OT Short Term Goal 3 (Week 1): Pt will complete squat pivot transfer to toilet with max assist of one caregiver OT Short Term Goal 3 - Progress (Week 1): Progressing toward goal OT Short Term Goal 4 (Week 1): Pt will complet sit > stand with max assist of one caregiver to decrease burden of care with self-care tasks OT Short Term Goal 4 - Progress (Week 1): Met  Skilled Therapeutic Interventions/Progress Updates:   Pt was lying supine in bed at time of arrival. Pt required max encouragement to participate in skilled tx instead of having a conversation. In time, pt completed supine<sit with Max A and instruction on technique. Difficultly with maintaining static sitting balance without bilateral UE support. Pt encouraged to eat breakfast or engage in different therapy activities while at EOB with pt actively refusing. Pt encouraged to don shirt with pt requiring Total A due to pt insisting skilled OT complete for him. Pt educated on OT role, POC and established goals with pt verbalizing that his goal was to go home and have caregiver/wife assist with all self care and transfers. Pt verbalized that he was going to buy STEDY lift at home. Pt refused to change soiled/wet pants despite encouragement and education regarding proper hygiene at hospital. "We're going to just forget about it." Transfer<bed to w/c completed with STEDY and Mod A. Pt agreed to  complete theraputty activities for L UE NMR with cues for attending to task while seated in w/c. At end of session pt was set up for breakfast and left with all needs within reach.   Therapy Documentation Precautions:  Precautions Precautions: Fall Precaution Comments: Lt hemiparesis and ataxia Restrictions Weight Bearing Restrictions: No General:     Pain: No c/o pain during session  Pain Assessment Pain Score: 3     See Function Navigator for Current Functional Status.   Therapy/Group: Individual Therapy  Sharia Averitt A Iline Buchinger 08/15/2016, 12:22 PM

## 2016-08-15 NOTE — Progress Notes (Signed)
Subjective/Complaints: "never have I felt so tired"   ROS- neg CP, SOB, N/V/D, no abd pain Objective: Vital Signs: Blood pressure 123/87, pulse 61, temperature 98.5 F (36.9 C), temperature source Oral, resp. rate 18, height 5' 6" (1.676 m), weight 87.8 kg (193 lb 9 oz), SpO2 98 %. No results found. Results for orders placed or performed during the hospital encounter of 08/02/16 (from the past 72 hour(s))  Basic metabolic panel     Status: Abnormal   Collection Time: 08/12/16 11:41 PM  Result Value Ref Range   Sodium 137 135 - 145 mmol/L   Potassium 4.1 3.5 - 5.1 mmol/L   Chloride 105 101 - 111 mmol/L   CO2 23 22 - 32 mmol/L   Glucose, Bld 112 (H) 65 - 99 mg/dL   BUN 31 (H) 6 - 20 mg/dL   Creatinine, Ser 1.48 (H) 0.61 - 1.24 mg/dL   Calcium 8.9 8.9 - 10.3 mg/dL   GFR calc non Af Amer 39 (L) >60 mL/min   GFR calc Af Amer 45 (L) >60 mL/min    Comment: (NOTE) The eGFR has been calculated using the CKD EPI equation. This calculation has not been validated in all clinical situations. eGFR's persistently <60 mL/min signify possible Chronic Kidney Disease.    Anion gap 9 5 - 15      General: No acute distress Mood and affect are appropriate Heart: Regular rate and rhythm no rubs murmurs or extra sounds, - JVD Lungs: Clear to auscultation, breathing unlabored, no rales or wheezes Abdomen: Positive bowel sounds, soft nontender to palpation, nondistended Extremities: No clubbing, cyanosis, Left pedal edema  Neurologic: Cranial nerves II through XII intact, motor strength is 5/5 in R deltoid, bicep, tricep, grip, hip flexor, knee extensors, ankle dorsiflexor and plantar flexor, 4/5 on left  Sensory exam normal sensation to light touch  bilateral upper and lower extremities Cerebellar exam normal finger to nose to finger as well as heel to shin in bilateral upperextremities Musculoskeletal: Pain with left hip abd , int/ext rotation, limited ROM oth hips, no pain to palp over knee,  no effusion, + pain with Left hip ROM, Pedal edema, Assessment/Plan: 1. 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 Physiatrist is providing close team supervision and 24 hour management of active medical problems listed below. Physiatrist and rehab team continue to assess barriers to discharge/monitor patient progress toward functional and medical goals. FIM: Function - Bathing Position: Shower Body parts bathed by patient: Right arm, Left arm, Chest, Abdomen, Right upper leg, Left upper leg, Front perineal area Body parts bathed by helper: Buttocks, Right lower leg, Left lower leg, Back Bathing not applicable: Buttocks, Right upper leg, Left upper leg, Right lower leg, Left lower leg Assist Level: Touching or steadying assistance(Pt > 75%)  Function- Upper Body Dressing/Undressing What is the patient wearing?: Button up shirt Pull over shirt/dress - Perfomed by patient: Thread/unthread right sleeve Pull over shirt/dress - Perfomed by helper: Pull shirt over trunk Button up shirt - Perfomed by patient: Thread/unthread right sleeve Button up shirt - Perfomed by helper: Thread/unthread left sleeve, Pull shirt around back, Button/unbutton shirt Assist Level: Touching or steadying assistance(Pt > 75%) Function - Lower Body Dressing/Undressing What is the patient wearing?: Pants, Non-skid slipper socks Position: Wheelchair/chair at sink Pants- Performed by patient: Thread/unthread right pants leg Pants- Performed by helper: Thread/unthread right pants leg, Thread/unthread left pants leg, Pull pants up/down  Non-skid slipper socks- Performed by helper: Don/doff right sock, Don/doff left sock Assist for footwear: Maximal assist Assist for lower body dressing: 2 Helpers  Function - Toileting Toileting activity did not occur: No continent bowel/bladder event Toileting steps completed  by patient: Adjust clothing prior to toileting, Performs perineal hygiene, Adjust clothing after toileting (for use of urinal) Toileting steps completed by helper: Adjust clothing prior to toileting, Performs perineal hygiene, Adjust clothing after toileting Toileting Assistive Devices: Grab bar or rail Assist level: Touching or steadying assistance (Pt.75%) (stedy)  Function - Air cabin crew transfer assistive device: Bedside commode Mechanical lift: Stedy Assist level to toilet: Moderate assist (Pt 50 - 74%/lift or lower) Assist level from toilet: Moderate assist (Pt 50 - 74%/lift or lower) Assist level to bedside commode (at bedside):  (stedy) Assist level from bedside commode (at bedside):  (stedy)  Function - Chair/bed transfer Chair/bed transfer method: Squat pivot Chair/bed transfer assist level: 2 helpers Chair/bed transfer assistive device: Armrests Mechanical lift: Stedy Chair/bed transfer details: Manual facilitation for placement, Manual facilitation for weight bearing  Function - Locomotion: Wheelchair Will patient use wheelchair at discharge?: Yes Type: Manual Max wheelchair distance: 50 Assist Level: Moderate assistance (Pt 50 - 74%) Assist Level: Moderate assistance (Pt 50 - 74%) Assist Level: Dependent (Pt equals 0%) Turns around,maneuvers to table,bed, and toilet,negotiates 3% grade,maneuvers on rugs and over doorsills: No Function - Locomotion: Ambulation Ambulation activity did not occur: Safety/medical concerns (per report) Assistive device: Clarise Cruz plus Max distance: 10+10 Assist level: 2 helpers Walk 10 feet activity did not occur: Safety/medical concerns Assist level: 2 helpers Walk 50 feet with 2 turns activity did not occur: Safety/medical concerns Walk 150 feet activity did not occur: Safety/medical concerns Walk 10 feet on uneven surfaces activity did not occur: Safety/medical concerns  Function - Comprehension Comprehension:  Auditory Comprehension assist level: Understands basic 90% of the time/cues < 10% of the time  Function - Expression Expression: Verbal Expression assist level: Expresses basic needs/ideas: With extra time/assistive device  Function - Social Interaction Social Interaction assist level: Interacts appropriately 90% of the time - Needs monitoring or encouragement for participation or interaction.  Function - Problem Solving Problem solving assist level: Solves basic 75 - 89% of the time/requires cueing 10 - 24% of the time  Function - Memory Memory assist level: Recognizes or recalls 90% of the time/requires cueing < 10% of the time Patient normally able to recall (first 3 days only): Current season, That he or she is in a hospital, Staff names and faces  Medical Problem List and Plan: 1.  Weakness, gait instability secondary to right internal capsule/thalamic hemorrhage. Left hemiataxia, cont CIR PT, OT                           2.  DVT Prophylaxis/Anticoagulation: Pharmaceutical: Lovenox, eventual plan for Eliquis 3. Pain Management: PRN tylenol, On tramadol , will reduce oxycontin 44m BID aspercreme 4. Mood: LCSW to follow for evaluation and support. Ask Chaplain to visit 5. Neuropsych: This patient is capable of making decisions on his own behalf. 6. Skin/Wound Care: Routine pressure relief measures.              Will consider santly to left elbow wound.  7. Fluids/Electrolytes/Nutrition: Encourage fluid intake and offer supplements between meals. Monitor I/O. Check lytes in am.  8. HKZS:[id number] Norvasc, Lisinopril and Lasix daily. SBP goal<160.  Vitals:   08/14/16 1337 08/15/16 0500  BP: (Marland Kitchen  152/72 123/87  Pulse: 66 61  Resp: 18 18  Temp: 98.6 F (37 C) 98.5 F (36.9 C)    9. CBS:WHQPRFF HR bid--has been controlled.  10. Pre-renal azotemia: Encourage po fluids. Add supplements between meals. Elevated BUN and creat  Stop prinivil and lasix , recheck BMET later in  week  11. Chronic insomnia: sleeps in a recliner at nights. Pt states this is due to nasal congestion will try ocean nasal spray 12. Hyperlipidemia: Pravastatin added this admission.  13.  Left hip pain severe  OA on xray, prn tramadol , Reduce Oxy CR 62m BID, due to over- sedation LOS (Days) 13 A FACE TO FACE EVALUATION WAS PERFORMED  Channah Godeaux E 08/15/2016, 9:47 AM

## 2016-08-16 ENCOUNTER — Inpatient Hospital Stay (HOSPITAL_COMMUNITY): Payer: Medicare Other | Admitting: Physical Therapy

## 2016-08-16 ENCOUNTER — Inpatient Hospital Stay (HOSPITAL_COMMUNITY): Payer: Medicare Other | Admitting: Occupational Therapy

## 2016-08-16 ENCOUNTER — Inpatient Hospital Stay (HOSPITAL_COMMUNITY): Payer: Medicare Other

## 2016-08-16 MED ORDER — SALINE SPRAY 0.65 % NA SOLN
1.0000 | NASAL | Status: DC | PRN
  Administered 2016-08-16: 1 via NASAL
  Filled 2016-08-16: qty 44

## 2016-08-16 MED ORDER — OXYCODONE HCL ER 15 MG PO T12A
15.0000 mg | EXTENDED_RELEASE_TABLET | Freq: Two times a day (BID) | ORAL | Status: DC
  Administered 2016-08-16 – 2016-08-18 (×5): 15 mg via ORAL
  Filled 2016-08-16 (×5): qty 1

## 2016-08-16 MED ORDER — OXYCODONE HCL ER 10 MG PO T12A: 10.0000 mg | EXTENDED_RELEASE_TABLET | Freq: Two times a day (BID) | ORAL | Status: DC

## 2016-08-16 MED ORDER — ENSURE ENLIVE PO LIQD
237.0000 mL | Freq: Two times a day (BID) | ORAL | Status: DC
  Administered 2016-08-16 – 2016-08-18 (×3): 237 mL via ORAL

## 2016-08-16 NOTE — Progress Notes (Signed)
Occupational Therapy Session Note  Patient Details  Name: Thomas Burnett MRN: [id number] Date of Birth: 30-Dec-1919  Today's Date: 08/16/2016 OT Individual Time: [id number] and 1000-1100 OT Individual Time Calculation (min): 26 min and 60 min    Short Term Goals: Week 2:  OT Short Term Goal 1 (Week 2): Pt will complete LB dressing with max assist of one caregiver at sit > stand level OT Short Term Goal 2 (Week 2): Pt will complete bathing with mod assist of one caregiver at sit > stand level OT Short Term Goal 3 (Week 2): Pt will complete squat pivot transfer to toilet with max assist of one caregiver OT Short Term Goal 4 (Week 2): Pt will utilize LUE as gross assist during self-care tasks with <10% cues  Skilled Therapeutic Interventions/Progress Updates:    1) Treatment session with focus discussion about discharge planning and recommended DME upon d/c.  Pt reporting pain in Lt calf and refusing any activity.  Pt educated on importance of mobility and decreasing burden of care with pt reporting that he wanted to buy a Stedy and will have hired caregivers at home.  Discussed recommendation for tub transfer bench and BSC upon d/c home after next venue of care, as pt reports that the bathroom doorway is narrow and may not be accessible via w/c or Stedy.  Discussed plan to go to [facility] apt to see and practice tub bench transfer upon therapist return with pt receptive.  2) Treatment session limited by reports of pain and pt stating that his pain is more important than a shower.  Positioned pt's legs on floor to allow for repositioning and alleviate stress on calves while therapist washed lower legs and donned TEDS and hospital socks.  Pt required frequent redirection as he is quite verbose and minimizes any input from therapist.  Engaged in sit > stand x2 with Stedy with pt requiring increased assist, mod-max assist for anterior weight shift and lift off this session.  Discussed goal of decreasing  burden of care to 1 person assist with pt again pointing out his ability to hire the necessary care.  Changed shirt while in partial standing in [place] with pt requiring assist for trunk control due to Rt lateral lean as well as assist to fasten/unfasten buttons.  Returned to sitting in recliner with improved posture and pt's person hired caregiver arriving.    Therapy Documentation Precautions:  Precautions Precautions: Fall Precaution Comments: Lt hemiparesis and ataxia Restrictions Weight Bearing Restrictions: No Pain: Pain Assessment Pain Score: 2   Lt calf  See Function Navigator for Current Functional Status.   Therapy/Group: Individual Therapy  Simonne Come 08/16/2016, 12:06 PM

## 2016-08-16 NOTE — Progress Notes (Signed)
Social Work Patient ID: Thomas Burnett, male   DOB: 1919-11-10, 82 y.o.   MRN: [id number]  Met with pt and spoke with wife-Beverly via telephone to discus looking for NH, she added [facility] to the list. Pt's PCP follows at [facility] made aware may not be able to get a private room. Wife to go and visit facility today.

## 2016-08-16 NOTE — Progress Notes (Signed)
Physical Therapy Note  Patient Details  Name: Thomas Burnett MRN: [id number] Date of Birth: 1919/11/08 Today's Date: 08/16/2016  1415-1500, 45 min individual tx Pain: L hip and thigh, intermittent but unrated   Pt stated his bottom was sore, so he was willing to get out of the recliner.  In dayroom, pt positoned himself to use Stedy without cues.  With extra time and min assist, pt stood briefly.  Pt stood briefly x 3 during session, limited by L hip pain. Pt sat on high seat of Stedy x 15 minutes.  In recliner, pt performed 10 x 2 ankle pumps, 10 x 1 R straight leg raise, L active assistive straight leg raise, L quad set,  Pt left resting in recliner with all needs within reach; Maudry Mayhew RN in room.  See Function Navigator for current status.  [name] 08/16/2016, 10:22 AM

## 2016-08-16 NOTE — Progress Notes (Signed)
Subjective/Complaints: More alert today, per RN had good visit with grandkids yesterday No c/o pain, received tramadol at 530am today   ROS- neg CP, SOB, N/V/D, no abd pain Objective: Vital Signs: Blood pressure (!) 138/49, pulse 67, temperature 98.2 F (36.8 C), temperature source Oral, resp. rate 20, height 5\' 6"  (1.676 m), weight 83.3 kg (183 lb 10.3 oz), SpO2 98 %. No results found. No results found for this or any previous visit (from the past 72 hour(s)).    General: No acute distress Mood and affect are appropriate Heart: Regular rate and rhythm no rubs murmurs or extra sounds, - JVD Lungs: Clear to auscultation, breathing unlabored, no rales or wheezes Abdomen: Positive bowel sounds, soft nontender to palpation, nondistended Extremities: No clubbing, cyanosis, Left pedal edema  Neurologic: Cranial nerves II through XII intact, motor strength is 5/5 in R deltoid, bicep, tricep, grip, hip flexor, knee extensors, ankle dorsiflexor and plantar flexor, 4/5 on left  Sensory exam normal sensation to light touch  bilateral upper and lower extremities Cerebellar exam normal finger to nose to finger as well as heel to shin in bilateral upperextremities Musculoskeletal: Pain with left hip abd , int/ext rotation, limited ROM oth hips, no pain to palp over knee, no effusion, + pain with Left hip ROM, Pedal edema, Assessment/Plan: 1. 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 Physiatrist is providing close team supervision and 24 hour management of active medical problems listed below. Physiatrist and rehab team continue to assess barriers to discharge/monitor patient progress toward functional and medical goals. FIM: Function - Bathing Position: Shower Body parts bathed by patient: Right arm, Left arm, Chest, Abdomen, Right upper leg, Left upper leg, Front perineal area Body  parts bathed by helper: Buttocks, Right lower leg, Left lower leg, Back Bathing not applicable: Buttocks, Right upper leg, Left upper leg, Right lower leg, Left lower leg Assist Level: Touching or steadying assistance(Pt > 75%)  Function- Upper Body Dressing/Undressing What is the patient wearing?: Button up shirt Pull over shirt/dress - Perfomed by patient: Thread/unthread right sleeve Pull over shirt/dress - Perfomed by helper: Pull shirt over trunk, Thread/unthread right sleeve, Thread/unthread left sleeve, Put head through opening Button up shirt - Perfomed by patient: Thread/unthread right sleeve Button up shirt - Perfomed by helper: Thread/unthread left sleeve, Pull shirt around back, Button/unbutton shirt Assist Level:  (Total A) Function - Lower Body Dressing/Undressing What is the patient wearing?: Ted Hose, Non-skid slipper socks Position: Wheelchair/chair at sink Pants- Performed by patient: Thread/unthread right pants leg Pants- Performed by helper: Thread/unthread right pants leg, Thread/unthread left pants leg, Pull pants up/down Non-skid slipper socks- Performed by helper: Don/doff right sock, Don/doff left sock TED Hose - Performed by helper: Don/doff right TED hose, Don/doff left TED hose Assist for footwear: Dependant Assist for lower body dressing: 2 Helpers  Function - Toileting Toileting activity did not occur: No continent bowel/bladder event Toileting steps completed by patient: Adjust clothing prior to toileting, Performs perineal hygiene, Adjust clothing after toileting (for use of urinal) Toileting steps completed by helper: Adjust clothing prior to toileting, Performs perineal hygiene, Adjust clothing after toileting Toileting Assistive Devices: Grab bar or rail Assist level: Touching or steadying assistance (Pt.75%) (stedy)  Function - Air cabin crew transfer assistive device: Bedside commode Mechanical lift: Stedy Assist level to toilet: Moderate  assist (Pt 50 - 74%/lift or lower) Assist level from toilet: Moderate assist (Pt 50 -  74%/lift or lower) Assist level to bedside commode (at bedside):  (stedy) Assist level from bedside commode (at bedside):  (stedy)  Function - Chair/bed transfer Chair/bed transfer method: Squat pivot Chair/bed transfer assist level: Total assist (Pt < 25%) Chair/bed transfer assistive device: Mechanical lift Mechanical lift: Stedy Chair/bed transfer details: Manual facilitation for placement, Manual facilitation for weight bearing  Function - Locomotion: Wheelchair Will patient use wheelchair at discharge?: Yes Type: Manual Max wheelchair distance: 50 Assist Level: Moderate assistance (Pt 50 - 74%) Assist Level: Moderate assistance (Pt 50 - 74%) Assist Level: Dependent (Pt equals 0%) Turns around,maneuvers to table,bed, and toilet,negotiates 3% grade,maneuvers on rugs and over doorsills: No Function - Locomotion: Ambulation Ambulation activity did not occur: Safety/medical concerns (per report) Assistive device: Clarise Cruz plus Max distance: 10+10 Assist level: 2 helpers Walk 10 feet activity did not occur: Safety/medical concerns Assist level: 2 helpers Walk 50 feet with 2 turns activity did not occur: Safety/medical concerns Walk 150 feet activity did not occur: Safety/medical concerns Walk 10 feet on uneven surfaces activity did not occur: Safety/medical concerns  Function - Comprehension Comprehension: Auditory Comprehension assist level: Understands basic 75 - 89% of the time/ requires cueing 10 - 24% of the time  Function - Expression Expression: Verbal Expression assist level: Expresses basic needs/ideas: With extra time/assistive device  Function - Social Interaction Social Interaction assist level: Interacts appropriately 75 - 89% of the time - Needs redirection for appropriate language or to initiate interaction.  Function - Problem Solving Problem solving assist level: Solves basic  75 - 89% of the time/requires cueing 10 - 24% of the time  Function - Memory Memory assist level: Recognizes or recalls 75 - 89% of the time/requires cueing 10 - 24% of the time Patient normally able to recall (first 3 days only): Current season, That he or she is in a hospital, Staff names and faces  Medical Problem List and Plan: 1.  Weakness, gait instability secondary to right internal capsule/thalamic hemorrhage. Left hemiataxia, cont CIR PT, OT                           2.  DVT Prophylaxis/Anticoagulation: Pharmaceutical: Lovenox, eventual plan for Eliquis 3. Pain Management: PRN tylenol, On tramadol , will reduce oxycontin 15mg  BID aspercreme 4. Mood: LCSW to follow for evaluation and support. Ask Chaplain to visit 5. Neuropsych: This patient is capable of making decisions on his own behalf. 6. Skin/Wound Care: Routine pressure relief measures.              Will consider santly to left elbow wound.  7. Fluids/Electrolytes/Nutrition: Encourage fluid intake and pt will try ensure  Monitor I/O. Check lytes in am.  8. [id number] ON Norvasc, Lisinopril and Lasix daily. SBP goal<160.  Vitals:   08/15/16 1613 08/16/16 0520  BP: (!) 137/51 (!) 138/49  Pulse: 99 67  Resp: 20 20  Temp: 98.1 F (36.7 C) 98.2 F (36.8 C)    9. [id number] HR bid--has been controlled.  10. Pre-renal azotemia: Encourage po fluids. Add supplements between meals. Elevated BUN and creat  Stop prinivil and lasix , recheck BMET later in week  11. Chronic insomnia: sleeps in a recliner at nights. Pt states this is due to nasal congestion will try ocean nasal spray 12. Hyperlipidemia: Pravastatin added this admission.  13.  Left hip pain severe  OA on xray, prn tramadol , cont Oxy CR 15mg  BID, pt more alert , received 15mg   oxyCR yest without issues   LOS (Days) 14 A FACE TO FACE EVALUATION WAS PERFORMED  KIRSTEINS,ANDREW E 08/16/2016, 7:30 AM

## 2016-08-16 NOTE — Progress Notes (Signed)
Physical Therapy Session Note  Patient Details  Name: Thomas Burnett MRN: [id number] Date of Birth: 09-04-1920  Today's Date: 08/16/2016 PT Individual Time: 1130-1200 PT Individual Time Calculation (min): 30 min    Short Term Goals: Week 2:  PT Short Term Goal 1 (Week 2): Pt will transfer with +1 assist using LRAD PT Short Term Goal 2 (Week 2): Pt will propel w/c 50' using R hemi-technique PT Short Term Goal 3 (Week 2): Pt will demonstrate alternating attention x5 minutes with min cues  Skilled Therapeutic Interventions/Progress Updates:    Pt resting in recliner on arrival, no c/o pain at rest but states "you never know which way it's going to go!"  Pt requires max encouragement to participate in therapy session focus on activity tolerance and transfers. Pt transitioned sit<>stand in stedy with mod assist for forward weight shift and initial boosting.  Total assist for lower body dressing in stedy.  Pt able to tolerate standing in stedy x5 minutes before requesting to sit back down.  Pt positioned in R side sitting with pillow under L hip for pressure relief and educated on switching sides in about an hour.  Pt verbalized understanding and wife present during education.  Pt left positioned to comfort with call bell in reach and needs met.   Therapy Documentation Precautions:  Precautions Precautions: Fall Precaution Comments: Lt hemiparesis and ataxia Restrictions Weight Bearing Restrictions: No   See Function Navigator for Current Functional Status.   Therapy/Group: Individual Therapy  Earnest Conroy Penven-Crew 08/16/2016, 12:10 PM

## 2016-08-16 NOTE — [place] FL2 (Signed)
[place] MEDICAID FL2 LEVEL OF CARE SCREENING TOOL     IDENTIFICATION  Patient Name: Thomas Burnett Birthdate: [birth year] Sex: male Admission Date (Current Location): 08/02/2016  [facility] and [place] Number:  Herbalist and Address:  [facility] [place], [place] [address] [place], [place] [address]      Provider Number: [id number]  Attending Physician Name and Address:  Charlett Blake, MD  Relative Name and Phone Number:  Beverly-wife [phone]    Current Level of Care: Other (Comment) (Rehab) Recommended Level of Care: [place] [facility] Prior Approval Number:    Date Approved/Denied:   PASRR Number: [id number]  Discharge Plan: SNF    Current Diagnoses: Patient Active Problem List   Diagnosis Date Noted  . Hypertensive emergency 08/02/2016  . Chronic a-fib ([place]) 08/02/2016  . Hyperlipidemia 08/02/2016  . Obesity 08/02/2016  . Prostate cancer ([place]) 08/02/2016  . Right bundle branch block 08/02/2016  . Nontraumatic thalamic hemorrhage ([place]) 08/02/2016  . Gait disturbance, post-stroke   . Left hemiparesis ([place])   . Open wound   . Benign essential HTN   . Chronic atrial fibrillation ([place])   . Prerenal azotemia   . Primary insomnia   . Mixed hyperlipidemia   . ICH (intracerebral hemorrhage) (HCC) - small R thalamic d/t HTN 07/28/2016    Orientation RESPIRATION BLADDER Height & Weight     Self, Time, Situation, Place  Normal Incontinent Weight: 183 lb 10.3 oz (83.3 kg) Height:  5\' 6"  (167.6 cm)  BEHAVIORAL SYMPTOMS/MOOD NEUROLOGICAL BOWEL NUTRITION STATUS      Continent Diet (heart healthy)  AMBULATORY STATUS COMMUNICATION OF NEEDS Skin   Extensive Assist Verbally Normal                       Personal Care Assistance Level of Assistance  Dressing, Bathing Bathing Assistance: Limited assistance   Dressing Assistance: Limited assistance     Functional Limitations Info  Sight Sight Info: Impaired         SPECIAL CARE FACTORS FREQUENCY  PT (By licensed PT), OT (By licensed OT), Speech therapy     PT Frequency: 5x week OT Frequency: 5x week     Speech Therapy Frequency: 5x week      Contractures      Additional Factors Info                  Current Medications (08/16/2016):  This is the current hospital active medication list Current Facility-Administered Medications  Medication Dose Route Frequency Provider Last Rate Last Dose  . acetaminophen (TYLENOL) tablet 650 mg  650 mg Oral Q4H PRN Bary Leriche, PA-C   650 mg at 08/14/16 1722  . alum & mag hydroxide-simeth (MAALOX/MYLANTA) 200-200-20 MG/5ML suspension 30 mL  30 mL Oral Q4H PRN Bary Leriche, PA-C      . amLODipine (NORVASC) tablet 5 mg  5 mg Oral Daily Bary Leriche, PA-C   5 mg at 08/16/16 0801  . bisacodyl (DULCOLAX) suppository 10 mg  10 mg Rectal Daily PRN Bary Leriche, PA-C   10 mg at 08/11/16 0200  . cholecalciferol (VITAMIN D) tablet 1,000 Units  1,000 Units Oral Daily Bary Leriche, PA-C   1,000 Units at 08/16/16 0801  . cloNIDine (CATAPRES) tablet 0.1 mg  0.1 mg Oral Q6H PRN Ivan Anchors Love, PA-C      . collagenase (SANTYL) ointment   Topical Daily Bary Leriche, PA-C      .  diphenhydrAMINE (BENADRYL) 12.5 MG/5ML elixir 12.5-25 mg  12.5-25 mg Oral Q6H PRN Ivan Anchors Love, PA-C      . enoxaparin (LOVENOX) injection 40 mg  40 mg Subcutaneous Daily Pamela S Love, PA-C   40 mg at 08/16/16 0800  . feeding supplement (ENSURE ENLIVE) (ENSURE ENLIVE) liquid 237 mL  237 mL Oral BID BM Charlett Blake, MD      . furosemide (LASIX) tablet 20 mg  20 mg Oral Daily Bary Leriche, PA-C   20 mg at 08/16/16 0801  . guaiFENesin-dextromethorphan (ROBITUSSIN DM) 100-10 MG/5ML syrup 5-10 mL  5-10 mL Oral Q6H PRN Bary Leriche, PA-C      . loratadine (CLARITIN) tablet 10 mg  10 mg Oral Daily Ivan Anchors Love, PA-C   10 mg at 08/16/16 0800  . MUSCLE RUB CREA   Topical PRN Charlett Blake, MD      . oxyCODONE (OXYCONTIN) 12 hr  tablet 15 mg  15 mg Oral Q12H Charlett Blake, MD   15 mg at 08/16/16 [id number]  . pantoprazole (PROTONIX) EC tablet 40 mg  40 mg Oral Daily Bary Leriche, PA-C   40 mg at 08/16/16 0800  . pravastatin (PRAVACHOL) tablet 40 mg  40 mg Oral q1800 Ivan Anchors Love, PA-C   40 mg at 08/15/16 1720  . prochlorperazine (COMPAZINE) tablet 5-10 mg  5-10 mg Oral Q6H PRN Bary Leriche, PA-C       Or  . prochlorperazine (COMPAZINE) injection 5-10 mg  5-10 mg Intramuscular Q6H PRN Bary Leriche, PA-C       Or  . prochlorperazine (COMPAZINE) suppository 12.5 mg  12.5 mg Rectal Q6H PRN Bary Leriche, PA-C      . senna-docusate (Senokot-S) tablet 2 tablet  2 tablet Oral BID Bary Leriche, PA-C   2 tablet at 08/16/16 0759  . sodium chloride (OCEAN) 0.65 % nasal spray 1 spray  1 spray Each Nare PRN Charlett Blake, MD   1 spray at 08/16/16 [phone]  . sodium phosphate (FLEET) 7-19 GM/118ML enema 1 enema  1 enema Rectal Once PRN Ivan Anchors Love, PA-C      . sorbitol 70 % solution 45 mL  45 mL Oral Daily PRN Bary Leriche, PA-C   45 mL at 08/08/16 0612  . traMADol (ULTRAM) tablet 50 mg  50 mg Oral Q6H PRN Charlett Blake, MD   50 mg at 08/16/16 0542  . traZODone (DESYREL) tablet 25-50 mg  25-50 mg Oral QHS PRN Bary Leriche, PA-C   50 mg at 08/15/16 2042     Discharge Medications: Please see discharge summary for a list of discharge medications.  Relevant Imaging Results:  Relevant Lab Results:   Additional Information SSN: [id number]  Honey Zakarian, [name], LCSW

## 2016-08-17 ENCOUNTER — Inpatient Hospital Stay (HOSPITAL_COMMUNITY): Payer: Medicare Other

## 2016-08-17 ENCOUNTER — Inpatient Hospital Stay (HOSPITAL_COMMUNITY): Payer: Medicare Other | Admitting: Occupational Therapy

## 2016-08-17 ENCOUNTER — Inpatient Hospital Stay (HOSPITAL_COMMUNITY): Payer: Medicare Other | Admitting: Physical Therapy

## 2016-08-17 DIAGNOSIS — K5903 Drug induced constipation: Secondary | ICD-10-CM

## 2016-08-17 LAB — URINE MICROSCOPIC-ADD ON

## 2016-08-17 LAB — URINALYSIS, ROUTINE W REFLEX MICROSCOPIC
Glucose, UA: NEGATIVE mg/dL
Ketones, ur: NEGATIVE mg/dL
NITRITE: NEGATIVE
PROTEIN: 30 mg/dL — AB
SPECIFIC GRAVITY, URINE: 1.018 (ref 1.005–1.030)
pH: 5.5 (ref 5.0–8.0)

## 2016-08-17 LAB — CBC
HCT: 38.2 % — ABNORMAL LOW (ref 39.0–52.0)
HEMOGLOBIN: 12.5 g/dL — AB (ref 13.0–17.0)
MCH: 31.2 pg (ref 26.0–34.0)
MCHC: 32.7 g/dL (ref 30.0–36.0)
MCV: 95.3 fL (ref 78.0–100.0)
PLATELETS: 176 10*3/uL (ref 150–400)
RBC: 4.01 MIL/uL — AB (ref 4.22–5.81)
RDW: 14.2 % (ref 11.5–15.5)
WBC: 12.2 10*3/uL — AB (ref 4.0–10.5)

## 2016-08-17 MED ORDER — TRAZODONE HCL 50 MG PO TABS: 25.0000 mg | ORAL_TABLET | Freq: Every evening | ORAL | Status: AC | PRN

## 2016-08-17 MED ORDER — COLLAGENASE 250 UNIT/GM EX OINT: TOPICAL_OINTMENT | Freq: Every day | CUTANEOUS | 0 refills | Status: DC

## 2016-08-17 MED ORDER — TRAMADOL HCL 50 MG PO TABS: 50.0000 mg | ORAL_TABLET | Freq: Four times a day (QID) | ORAL | 0 refills | Status: AC | PRN

## 2016-08-17 MED ORDER — CEPHALEXIN 250 MG PO CAPS
250.0000 mg | ORAL_CAPSULE | Freq: Three times a day (TID) | ORAL | Status: DC
  Administered 2016-08-17 – 2016-08-18 (×4): 250 mg via ORAL
  Filled 2016-08-17 (×5): qty 1

## 2016-08-17 MED ORDER — SENNOSIDES-DOCUSATE SODIUM 8.6-50 MG PO TABS: 3.0000 | ORAL_TABLET | Freq: Two times a day (BID) | ORAL | Status: AC

## 2016-08-17 MED ORDER — FLEET ENEMA 7-19 GM/118ML RE ENEM: 1.0000 | ENEMA | Freq: Every day | RECTAL | 0 refills | Status: AC | PRN

## 2016-08-17 MED ORDER — MUSCLE RUB 10-15 % EX CREA: 1.0000 "application " | TOPICAL_CREAM | Freq: Three times a day (TID) | CUTANEOUS | 0 refills | Status: AC

## 2016-08-17 MED ORDER — SENNOSIDES-DOCUSATE SODIUM 8.6-50 MG PO TABS
3.0000 | ORAL_TABLET | Freq: Two times a day (BID) | ORAL | Status: DC
  Administered 2016-08-17 – 2016-08-18 (×3): 3 via ORAL
  Filled 2016-08-17 (×3): qty 3

## 2016-08-17 MED ORDER — PRAVASTATIN SODIUM 40 MG PO TABS: 40.0000 mg | ORAL_TABLET | Freq: Every day | ORAL | Status: AC

## 2016-08-17 MED ORDER — AMLODIPINE BESYLATE 5 MG PO TABS: 5.0000 mg | ORAL_TABLET | Freq: Every day | ORAL | Status: AC

## 2016-08-17 MED ORDER — PANTOPRAZOLE SODIUM 40 MG PO TBEC: 40.0000 mg | DELAYED_RELEASE_TABLET | Freq: Every day | ORAL | Status: AC

## 2016-08-17 MED ORDER — OXYCODONE HCL ER 15 MG PO T12A: 15.0000 mg | EXTENDED_RELEASE_TABLET | Freq: Two times a day (BID) | ORAL | 0 refills | Status: AC

## 2016-08-17 MED ORDER — ACETAMINOPHEN 325 MG PO TABS: 650.0000 mg | ORAL_TABLET | ORAL | Status: AC | PRN

## 2016-08-17 NOTE — Progress Notes (Signed)
Social Work Patient ID: Thomas Burnett, male   DOB: 1920/01/06, 82 y.o.   MRN: [id number]  Met with pt and wife to discuss going to a NH until able to go home. He was agreeable to go to [facility] As long as Dr. Felipa Eth recommends it, which he does. Will plan for transfer tomorrow, wife to go with to sign paperwork. Will work toward discharge to [facility] tomorrow.

## 2016-08-17 NOTE — Progress Notes (Signed)
Occupational Therapy Session Note  Patient Details  Name: Thomas Burnett MRN: [id number] Date of Birth: 1920-05-29  Today's Date: 08/17/2016 OT Individual Time: 0906-1000 OT Individual Time Calculation (min): 54 min  and Today's Date: 08/17/2016 OT Missed Time: 6 Minutes Missed Time Reason: Nursing care     Short Term Goals: Week 2:  OT Short Term Goal 1 (Week 2): Pt will complete LB dressing with max assist of one caregiver at sit > stand level OT Short Term Goal 2 (Week 2): Pt will complete bathing with mod assist of one caregiver at sit > stand level OT Short Term Goal 3 (Week 2): Pt will complete squat pivot transfer to toilet with max assist of one caregiver OT Short Term Goal 4 (Week 2): Pt will utilize LUE as gross assist during self-care tasks with <10% cues  Skilled Therapeutic Interventions/Progress Updates:    Treatment session with focus in participation in self-care tasks and functional use of LUE.  Pt received in bed with RN present obtaining urine sample.  Therapist returned and assisted pt with bed mobility, providing tactile cues to promote proper sequencing with rolling and increase participation.  Pt continues to request increased assistance with all movements and demonstrates decreased motivation to attempt tasks for himself.  Max assist sidelying to sitting with manual facilitation for weight shift.  Pt hesitant with all movements due to fear of pain and directing therapist "to go slow".  Utilized Stedy with pt able to pull into standing with min assist and improved motor control when reaching for [facility] with [name].  Therapist assisted in hygiene while standing in [place] and with max encouragement pt engaged in UB bathing.  Cues provided to visually attend to LUE during bathing, provided pt with wash mitt to increase success when utilizing LUE during bathing tasks.  Pt donned button up shirt via pull over technique as pt demonstrated improved sequencing.  Transferred to  recliner and left seated in recliner on pressure relieving cushion with legs elevated.  Pt required frequent rest breaks and redirection to engage in task.    Therapy Documentation Precautions:  Precautions Precautions: Fall Precaution Comments: Lt hemiparesis and ataxia Restrictions Weight Bearing Restrictions: No General: General OT Amount of Missed Time: 6 Minutes Vital Signs:   Pain: Pain Assessment Pain Assessment: 0-10 Pain Score: 4  Pain Type: Chronic pain;Acute pain Pain Location: Buttocks Pain Orientation: Left Multiple Pain Sites: No  See Function Navigator for Current Functional Status.   Therapy/Group: Individual Therapy  Simonne Come 08/17/2016, 6:03 PM

## 2016-08-17 NOTE — Progress Notes (Signed)
Occupational Therapy Discharge Summary  Patient Details  Name: Thomas Burnett MRN: [id number] Date of Birth: [birth year]  Patient has met 7 of 12 long term goals due to postural control.  Patient's progress towards goals was limited secondary to decreased participation due to pain, decreased attention and awareness as evidenced by pt's verbosity, difficulty redirecting, and fluctuating agitation. Patient to discharge at overall Mod - Total assist level.  Use of Stedy lift with 1 caregiver with all sit <> stand and transfers for decreased burden of care. Patient's care partner unavailable to provide the necessary physical and cognitive assistance at discharge.    Reasons goals not met: Pt did not meet toilet transfers or toileting, LB dressing, shower transfers, or dynamic standing balance due to decreased participation in treatment sessions due to pain and fearfulness of pain with mobility.  [name] with all self-care tasks when pt willing to complete therefore able to complete transfers with 1 person assist, however dependent due to use of lift equipment.  Pt able to pull self-up into standing with min-mod assist therefore decreasing burden of care however still requiring total assist either due to use of lift equipment or due to decreased participation and willingness to engage in therapeutic intervention.  Recommendation:  Patient will benefit from ongoing skilled OT services in skilled nursing facility setting to continue to advance functional skills in the area of BADL and Reduce care partner burden.  Equipment: No equipment provided  Reasons for discharge: discharge from hospital  Patient/family agrees with progress made and goals achieved: Yes  OT Discharge Precautions/Restrictions  Precautions Precautions: Fall Precaution Comments: Lt hemiparesis and ataxia General OT Amount of Missed Time: 6 Minutes Vital Signs   Pain Pain Assessment Pain Score: 5  Pain Type: Chronic  pain Pain Location: Generalized (the left side) Pain Orientation: Left Patients Stated Pain Goal: 2 Pain Intervention(s): Medication (See eMAR);Repositioned ADL   Vision/Perception  Vision- History Baseline Vision/History: Wears glasses;Macular Degeneration Wears Glasses: Reading only Patient Visual Report:  (reports he needs stronger glasses, but able to see and read functionally)  Cognition Overall Cognitive Status: Within Functional Limits for tasks assessed Arousal/Alertness: Awake/alert Orientation Level: Oriented X4 Attention: Selective Selective Attention: Appears intact Memory: Appears intact Awareness: Appears intact Problem Solving: Appears intact Behaviors: Perseveration;Poor frustration tolerance Safety/Judgment: Appears intact Sensation Sensation Light Touch: Appears Intact Stereognosis: Not tested Hot/Cold: Not tested Proprioception: Impaired by gross assessment Coordination Gross Motor Movements are Fluid and Coordinated: No Fine Motor Movements are Fluid and Coordinated: No Coordination and Movement Description: LUE ataxia ongoing, but improving Finger Nose Finger Test: ataxia in LUE 9 Hole Peg Test: did not complete due to ataxia Motor    Mobility     Trunk/Postural Assessment     Balance   Extremity/Trunk Assessment RUE Assessment RUE Assessment: Within Functional Limits LUE Assessment LUE Assessment: Exceptions to WFL (ataxia, ROM WFL, strength grossly 3+/5, loose gross grasp)   See Function Navigator for Current Functional Status.  Simonne Come 08/17/2016, 7:33 PM

## 2016-08-17 NOTE — Plan of Care (Signed)
Problem: RH Car Transfers Goal: LTG Patient will perform car transfers with assist (PT) LTG: Patient will perform car transfers with assistance (PT).  Outcome: Not Applicable Date Met: 53/31/74 Pt to d/c to SNF in ambulance.   Problem: RH Ambulation Goal: LTG Patient will ambulate in controlled environment (PT) LTG: Patient will ambulate in a controlled environment, # of feet with assistance (PT).  Outcome: Not Applicable Date Met: 09/92/78 Pt with severe OA pain in hip and knee and unable/unwilling to attempt ambulation while on CIR  Problem: RH Wheelchair Mobility Goal: LTG Patient will propel w/c in controlled environment (PT) LTG: Patient will propel wheelchair in controlled environment, # of feet with assist (PT)  Outcome: Not Met (add Reason) Pt unable to coordinate R hemi-technique for w/c propulsion  Goal: LTG Patient will propel w/c in home environment (PT) LTG: Patient will propel wheelchair in home environment, # of feet with assistance (PT).  Outcome: Not Met (add Reason) Pt unable to coordinate R hemi-technique for w/c propulsion   Problem: RH Attention Goal: LTG Patient will demonstrate focused/sustained (PT) LTG:  Patient will demonstrate focused/sustained/selective/alternating/divided attention during functional mobility in specific environment with assist for # of minutes  (PT)  Outcome: Not Met (add Reason) Pt continues to require mod/max assist for alternating attention in a mildly distracting environment

## 2016-08-17 NOTE — Progress Notes (Signed)
Physical Therapy Discharge Summary  Patient Details  Name: Thomas Burnett MRN: [id number] Date of Birth: 14-Nov-1919  Today's Date: 08/17/2016 PT Individual Time: 1400-1500 PT Individual Time Calculation (min): 60 min  and Today's Date: 08/17/2016 PT Missed Time: 15 Minutes Missed Time Reason: Patient fatigue;Pain     Patient has met 1 of 8 long term goals due to improved postural control and increased strength.  Patient to discharge at a wheelchair level Mod Assist.   Pt able to transfer with use of STEDY with +1 assist, however fluctuates with need for min>max assist for sit<>stand in stedy.  Pt with little progress while on CIR due to decreased participation 2/2 severe LLE OA pain and severely decreased attention marked by internal distraction and long winded talking with pt becoming agitated if redirected.  Pt fluctuated between being agreeable to therapy and "just wanting to be comfortable."  Patient to d/c to SNF for continued therapy.    Reasons goals not met: Pt continues to require increased assist for transfers and declining to participate in ambulation/stair training or w/c propulsion.  Pt frequently long-winded during therapy sessions and becomes agitated with attempts by therapist to redirect.  Pt also reluctant to attempt anything that will increase his OA pain.    Recommendation:  Patient will benefit from ongoing skilled PT services in skilled nursing facility setting to continue to advance safe functional mobility, address ongoing impairments in strength, balance, postural control, coordination, and attention, and minimize fall risk.  Equipment: No equipment provided  Reasons for discharge: lack of progress toward goals and discharge from hospital  Patient/family agrees with progress made and goals achieved: Yes   Skilled Therapeutic Intervention:  Pt seated on BSC upon arrival, c/o pain as below, RN in room with pt.  Pt declining to leave room or work on functional  mobility in room.  Pt performed toileting tasks with more than a reasonable amount of time, requiring total assist for hygiene and clothing management, and mod assist for sit<>stand from [facility] into stedy.  Pt able to tolerate supported standing in stedy x4 minutes but demonstrates forward trunk flexion that pt does not correct despite multimodal cues.  Pt returned to recliner at end of session with mod assist to control descent and to scoot back in seat.  Pt positioned to comfort with call bell in reach and needs met.   PT Discharge Precautions/RestrictionsPrecautions Precautions: Fall Precaution Comments: Lt hemiparesis and ataxia Restrictions Weight Bearing Restrictions: No Pain Pain Assessment Pain Assessment: 0-10 Pain Score: 4  Pain Type: Chronic pain;Acute pain Pain Location: Buttocks Pain Orientation: Left Pain Intervention(s): Medication (See eMAR);Repositioned Multiple Pain Sites: No  Cognition Overall Cognitive Status: Within Functional Limits for tasks assessed Arousal/Alertness: Awake/alert Orientation Level: Oriented X4 Attention: Selective Behaviors: Perseveration Sensation Sensation Light Touch: Appears Intact Proprioception: Impaired by gross assessment Coordination Gross Motor Movements are Fluid and Coordinated: No Fine Motor Movements are Fluid and Coordinated: No Coordination and Movement Description: LUE ataxia ongoing, but improving Motor  Motor Motor: Abnormal postural alignment and control;Ataxia;Other (comment) Motor - Discharge Observations: L hemiparesis  Mobility Bed Mobility Bed Mobility: Not assessed (pt refused, reports sleeping in recliner at home) Transfers Transfers: Yes Transfer via Lift Equipment: Stedy Locomotion  Ambulation Ambulation: No (unable to tolerate L SLS for ambulation) Gait Gait: No Stairs / Additional Locomotion Stairs: No Wheelchair Mobility Wheelchair Mobility: No (unable to coordinate hemi-technique 2/2 attention and  coordination deficits)  Trunk/Postural Assessment  Cervical Assessment Cervical Assessment: Exceptions to [facility] (forward  head) Thoracic Assessment Thoracic Assessment: Exceptions to [facility] (kyphotic) Lumbar Assessment Lumbar Assessment: Exceptions to [facility] (posterior pelvic tilt and poor disociation) Postural Control Postural Control: Deficits on evaluation Trunk Control: posterior lean, decreased abdominal strength Righting Reactions: delayed and inadequate  Balance Balance Balance Assessed: Yes Static Sitting Balance Static Sitting - Balance Support: Right upper extremity supported;Left upper extremity supported;Feet supported Static Sitting - Level of Assistance: 5: Stand by assistance Dynamic Sitting Balance Dynamic Sitting - Balance Support: Right upper extremity supported;Left upper extremity supported;Feet supported Dynamic Sitting - Level of Assistance: 4: Min assist Static Standing Balance Static Standing - Balance Support: Right upper extremity supported;Left upper extremity supported Static Standing - Level of Assistance: 4: Min assist Dynamic Standing Balance Dynamic Standing - Balance Support: Right upper extremity supported;Left upper extremity supported;During functional activity (for standing/dressing in stedy) Dynamic Standing - Level of Assistance: 2: Max assist Extremity Assessment      RLE Assessment RLE Assessment: Exceptions to [facility] RLE AROM (degrees) RLE Overall AROM Comments: WFL  RLE Strength RLE Overall Strength Comments: not formally assessed but grossly 3/5 LLE Assessment LLE Assessment: Exceptions to WFL LLE AROM (degrees) LLE Overall AROM Comments: limited 2/2 OA pain in hip and knee LLE Strength LLE Overall Strength Comments: limited 2/2 OA pain in hip and knee   See Function Navigator for Current Functional Status.  Aleczander Fandino E Penven-Crew 08/17/2016, 2:28 PM

## 2016-08-17 NOTE — Progress Notes (Signed)
Physical Therapy Session Note  Patient Details  Name: Thomas Burnett MRN: [id number] Date of Birth: 25-Sep-1920  Today's Date: 08/17/2016 PT Individual Time: 1105-1130 PT Individual Time Calculation (min): 25 min    Skilled Therapeutic Interventions/Progress Updates:    Pt up in recliner declining transfers or working on standing. Pt very long winded and requires persistent redirection during session to address PT goals. Pt agreeable to upright seated therex to address LE strengthening and coordination including alternating marching, LAQ, heel raises, toe raises, and knee flexion (coordinated at same time BLE) x 10 reps each BLE. AAROM on LLE with physical assist provided at foot only due to pt reporting pain when touched elsewhere. End of session handoff to [place] and wife present.   Therapy Documentation Precautions:  Precautions Precautions: Fall Precaution Comments: Lt hemiparesis and ataxia Restrictions Weight Bearing Restrictions: No Pain: Pain Assessment Pain Score: 3  Faces Pain Scale: Hurts even more Pain Type: Chronic pain Pain Location: Leg Pain Orientation: Left Pain Intervention(s): Medication (See eMAR)   See Function Navigator for Current Functional Status.   Therapy/Group: Individual Therapy  Canary Brim Ivory Broad, PT, DPT  08/17/2016, 11:56 AM

## 2016-08-17 NOTE — Discharge Summary (Signed)
Physician Discharge Summary  Patient ID: SHAHEEM FOLLEY MRN: [id number] DOB/AGE: Aug 02, 1920 82 y.o.  Admit date: 08/02/2016 Discharge date: 08/18/2016  Discharge Diagnoses:  Principal Problem:   Nontraumatic thalamic hemorrhage ([place]) Active Problems:   Gait disturbance, post-stroke   Left hemiparesis (HCC)   Benign essential HTN   Chronic atrial fibrillation (HCC)   Prerenal azotemia   Primary insomnia   Mixed hyperlipidemia   Decubitus ulcer of sacral region, unstageable ([place])   UTI (urinary tract infection)   Therapeutic opioid-induced constipation (OIC)   Discharged Condition: stable  Significant Diagnostic Studies:   Dg Hips Bilat With Pelvis 2v  Result Date: 08/04/2016 CLINICAL DATA:  Hip pain that radiates into left thigh. EXAM: DG HIP (WITH OR WITHOUT PELVIS) 2V BILAT COMPARISON:  CT 01/06/2000. FINDINGS: Degenerative changes lumbar spine and both hips. No acute bony abnormality identified. No evidence of fracture. Coarse pelvic calcification noted consistent with fibroid. Calcified pelvic densities consistent phleboliths noted. Aortoiliac atherosclerotic vascular calcification. Left nephrolithiasis. IMPRESSION: Degenerative changes lumbar spine and both hips. Diffuse osteopenia. No acute bony abnormality identified. 2. Probable calcified fibroid.  Left nephrolithiasis. 3.  Aortoiliac atherosclerotic vascular disease . Electronically Signed   By: Marcello Moores  Register   On: 08/04/2016 10:09    Labs:  Basic Metabolic Panel: BMP Latest Ref Rng & Units 08/12/2016 08/09/2016 08/03/2016  Glucose 65 - 99 mg/dL 112(H) 121(H) 122(H)  BUN 6 - 20 mg/dL 31(H) 26(H) 30(H)  Creatinine 0.61 - 1.24 mg/dL 1.48(H) 1.54(H) 1.20  Sodium 135 - 145 mmol/L 137 138 141  Potassium 3.5 - 5.1 mmol/L 4.1 4.3 3.9  Chloride 101 - 111 mmol/L 105 102 109  CO2 22 - 32 mmol/L 23 26 23   Calcium 8.9 - 10.3 mg/dL 8.9 9.3 9.3      CBC: CBC Latest Ref Rng & Units 08/17/2016 08/10/2016 08/03/2016  WBC 4.0  - 10.5 K/uL 12.2(H) 7.3 7.2  Hemoglobin 13.0 - 17.0 g/dL 12.5(L) 12.9(L) 13.9  Hematocrit 39.0 - 52.0 % 38.2(L) 39.0 42.2  Platelets 150 - 400 K/uL 176 167 167    CBG: No results for input(s): GLUCAP in the last 168 hours.   Urinalysis    Component Value Date/Time   COLORURINE AMBER (A) 08/17/2016 0911   APPEARANCEUR CLOUDY (A) 08/17/2016 0911   LABSPEC 1.018 08/17/2016 0911   PHURINE 5.5 08/17/2016 0911   GLUCOSEU NEGATIVE 08/17/2016 0911   HGBUR SMALL (A) 08/17/2016 0911   BILIRUBINUR SMALL (A) 08/17/2016 0911   KETONESUR NEGATIVE 08/17/2016 0911   PROTEINUR 30 (A) 08/17/2016 0911   NITRITE NEGATIVE 08/17/2016 0911   LEUKOCYTESUR MODERATE (A) 08/17/2016 0911     Brief HPI:   Jamisen Steketee is a 82 year old male with history fo HTN, prostate cancer, mild AS/AI, CAF--no Xarelto due to allergy; who was admitted on 07/28/16 with left sided weakness and mild left facial weakness with mild dysarthria.  BP elevated in ED and CT head done showing acute right thalamic hemorrhage with mild localized edema. Dr. Erlinda Hong felt that hemorrhage was due to hypertensive emergency and to consider eliquis in the future once ICH resolves. 2D echo with EF 55-60% with grade 1 diastolic dysfunction and moderate aortic stenosis. Carotid dopplers without significant stenosis.  Hydralazine added for better BP control but he developed itching therefore this was changed to Norvasc. Patient with resultant left sided weakness affecting ability to carry out ADL tasks and mobility. CIR recommended for follow up therapy    Hospital Course: QUINNLAN HARDNETT was admitted to  rehab 08/02/2016 for inpatient therapies to consist of PT, ST and OT at least three hours five days a week. Past admission physiatrist, therapy team and rehab RN have worked together to provide customized collaborative inpatient rehab. Blood pressure have been monitored on bid and have been reasonably controlled within SBP goal < 160.  Follow up labs  reveal pre-renal azotemia and he was encouraged to increase fluid intake and prinivil was discontinued.  Nutritional supplements were added between meals to help with hydration and nutritional status.  He has reported right hip and thigh pain which has affected his participation in therapy. Neuropsychologist was consulted for evaluation of mood and patient denied any depressive or anxiety symptoms but expressed frustration regarding his pain control.    X rays of hip revealed OA and tramadol and local measures have been ineffective. OxyContin was added and titrated to 15 mg bid without side effects.    He continues to have issues with chronic insomnia and trazodone was added to help with sleep wake cycle. He continues to spend his days and nights in the recliner chair and was educated on importance performing pressure relief by side lying  in bed as he is unable to boost himself.   lack of mobility as well as urgency with incontinence has led to deep tissue injury and MASD. Foam dressing was added to sacrum for padding.   Bowel program has been augmented to help manage OIC.  Follow up CBC showed leucocytosis and he was found to have > 100,000 colonies of gram negative rods. He was started on Keflex for treatment of UTI on 11/14  and sensitivities are currently pending. He has been making slow progress during his rehab stay and has required max encouragement for consistent participation.   Wife is unable to provide care needed and has elected on SNF for progressive therapy. Patient was discharged to [facility] on 11/15   Rehab course: During patient's stay in rehab weekly team conferences were held to monitor patient's progress, set goals and discuss barriers to discharge. At admission, patient required  Max to total assist + 2 for basic self care tasks and Max +2 assist for mobility.  Speech therapy not needed as no cognitive deficits noted on evaluation and he was able to independently compensate for  occasional word finding deficits. He continues to be limited by LUE ataxia, fear and anxiety limiting mobility, decreased attention with distractibility and inconsistent participation. He requires mod to total assist for ADL tasks. He is able to pull up to standing with min to mod assist. He is requires mod assist with [facility] for transfers. He requires mod to max assist for weight shifting and is unable to utilize right hemi-techniques to propel his wheelchair in controlled environment.  He has been able to ambulate 10+ 10 feet with +2 assist in [place].       Disposition:  Section.   Diet: Heart Healthy diet.   Special Instructions: 1. Encourage boosting/pressure relief every 30-45 minutes.  2. Offer supplements between meals. Recheck BMET in next 2-3 days.  3. Follow up on urine culture results for sensitivities.   4. Follow up with PMD or Neurology for input on addition of Eliquis once ICH resolves.     Medication List    STOP taking these medications   aspirin EC 81 MG tablet   FLUZONE HIGH-DOSE 0.5 ML Susy Generic drug:  Influenza Vac Split High-Dose   naproxen sodium 220 MG tablet Commonly known as:  ANAPROX     TAKE these medications   acetaminophen 325 MG tablet Commonly known as:  TYLENOL Take 2 tablets (650 mg total) by mouth every 4 (four) hours as needed for mild pain (or temp > 99 F).   amLODipine 5 MG tablet Commonly known as:  NORVASC Take 1 tablet (5 mg total) by mouth daily.   cephALEXin 250 MG capsule Commonly known as:  KEFLEX Take 1 capsule (250 mg total) by mouth every 8 (eight) hours.   collagenase ointment Commonly known as:  SANTYL Apply topically daily.   furosemide 20 MG tablet Commonly known as:  LASIX Take 20 mg by mouth daily.   loratadine 10 MG tablet Commonly known as:  CLARITIN Take 10 mg by mouth daily.   MUSCLE RUB 10-15 % Crea Apply 1 application topically 3 (three) times daily. To bilateral  Hips/thighs.    oxyCODONE 15 mg 12 hr tablet--Rx # 10 pills  Commonly known as:  OXYCONTIN Take 1 tablet (15 mg total) by mouth every 12 (twelve) hours.   pantoprazole 40 MG tablet Commonly known as:  PROTONIX Take 1 tablet (40 mg total) by mouth daily.   pravastatin 40 MG tablet Commonly known as:  PRAVACHOL Take 1 tablet (40 mg total) by mouth daily at 6 PM.   senna-docusate 8.6-50 MG tablet Commonly known as:  Senokot-S Take 3 tablets by mouth 2 (two) times daily.   sodium phosphate 7-19 GM/118ML Enem Place 133 mLs (1 enema total) rectally daily as needed for severe constipation.   traMADol 50 MG tablet--Rx # 5 pills  Commonly known as:  ULTRAM Take 1 tablet (50 mg total) by mouth every 6 (six) hours as needed for moderate pain.   traZODone 50 MG tablet Commonly known as:  DESYREL Take 0.5-1 tablets (25-50 mg total) by mouth at bedtime as needed for sleep.   Vitamin D-3 1000 units Caps Take 1,000 Units by mouth daily.       Contact information for follow-up providers    Charlett Blake, MD .   Specialty:  Physical Medicine and Rehabilitation Contact information: [place] [address] [phone]        Antony Contras, MD. Call today.   Specialties:  Neurology, Radiology Why:  for follow up appointment in 3-4 weeks.  Contact information: [address] [place] [address] [phone]            Contact information for after-discharge care    Destination    [facility] .   Specialty:  [place] information: [address] [place] [phone]                  Signed: Bary Leriche 08/18/2016, 9:12 AM

## 2016-08-17 NOTE — Progress Notes (Signed)
Subjective/Complaints: Pt states he is voiding well but has constipation.  Stopped eating cheese   ROS- neg CP, SOB, N/V/D, no abd pain Objective: Vital Signs: Blood pressure 136/79, pulse 97, temperature 98.2 F (36.8 C), temperature source Oral, resp. rate 18, height 5\' 6"  (1.676 m), weight 83.9 kg (184 lb 15.5 oz), SpO2 95 %. No results found. Results for orders placed or performed during the hospital encounter of 08/02/16 (from the past 72 hour(s))  CBC     Status: Abnormal   Collection Time: 08/17/16  5:32 AM  Result Value Ref Range   WBC 12.2 (H) 4.0 - 10.5 K/uL   RBC 4.01 (L) 4.22 - 5.81 MIL/uL   Hemoglobin 12.5 (L) 13.0 - 17.0 g/dL   HCT 38.2 (L) 39.0 - 52.0 %   MCV 95.3 78.0 - 100.0 fL   MCH 31.2 26.0 - 34.0 pg   MCHC 32.7 30.0 - 36.0 g/dL   RDW 14.2 11.5 - 15.5 %   Platelets 176 150 - 400 K/uL      General: No acute distress Mood and affect are appropriate Heart: Regular rate and rhythm no rubs murmurs or extra sounds, - JVD Lungs: Clear to auscultation, breathing unlabored, no rales or wheezes Abdomen: Positive bowel sounds, soft nontender to palpation, nondistended Extremities: No clubbing, cyanosis, Left pedal edema  Neurologic: Cranial nerves II through XII intact, motor strength is 5/5 in R deltoid, bicep, tricep, grip, hip flexor, knee extensors, ankle dorsiflexor and plantar flexor, 4/5 on left  Sensory exam normal sensation to light touch  bilateral upper and lower extremities Cerebellar exam normal finger to nose to finger as well as heel to shin in bilateral upperextremities Musculoskeletal: Pain with left hip abd , int/ext rotation, limited ROM oth hips, no pain to palp over knee, no effusion, + pain with Left hip ROM, Pedal edema, Assessment/Plan: 1. 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 Physiatrist is providing close team supervision  and 24 hour management of active medical problems listed below. Physiatrist and rehab team continue to assess barriers to discharge/monitor patient progress toward functional and medical goals. FIM: Function - Bathing Position: Shower Body parts bathed by patient: Right arm, Left arm, Chest, Abdomen, Right upper leg, Left upper leg, Front perineal area Body parts bathed by helper: Buttocks, Right lower leg, Left lower leg, Back Bathing not applicable: Buttocks, Right upper leg, Left upper leg, Right lower leg, Left lower leg Assist Level: Touching or steadying assistance(Pt > 75%)  Function- Upper Body Dressing/Undressing What is the patient wearing?: Button up shirt Pull over shirt/dress - Perfomed by patient: Thread/unthread right sleeve Pull over shirt/dress - Perfomed by helper: Pull shirt over trunk, Thread/unthread right sleeve, Thread/unthread left sleeve, Put head through opening Button up shirt - Perfomed by patient: Thread/unthread right sleeve Button up shirt - Perfomed by helper: Thread/unthread left sleeve, Pull shirt around back, Button/unbutton shirt Assist Level:  (Total A) Function - Lower Body Dressing/Undressing What is the patient wearing?: Ted Hose, Non-skid slipper socks Position: Wheelchair/chair at sink Pants- Performed by patient: Thread/unthread right pants leg Pants- Performed by helper: Thread/unthread right pants leg, Thread/unthread left pants leg, Pull pants up/down Non-skid slipper socks- Performed by helper: Don/doff right sock, Don/doff left sock TED Hose - Performed by helper: Don/doff right TED hose, Don/doff left TED hose Assist for footwear: Dependant Assist for lower body dressing: 2 Helpers  Function - Toileting Toileting activity  did not occur: No continent bowel/bladder event Toileting steps completed by patient: Adjust clothing prior to toileting, Performs perineal hygiene, Adjust clothing after toileting (for use of urinal) Toileting steps  completed by helper: Adjust clothing prior to toileting, Performs perineal hygiene, Adjust clothing after toileting [place]: Grab bar or rail Assist level: Touching or steadying assistance (Pt.75%) (stedy)  Function - Air cabin crew transfer assistive device: Bedside commode Mechanical lift: Stedy Assist level to toilet: Moderate assist (Pt 50 - 74%/lift or lower) Assist level from toilet: Moderate assist (Pt 50 - 74%/lift or lower) Assist level to bedside commode (at bedside):  (stedy) Assist level from bedside commode (at bedside):  (stedy)  Function - Chair/bed transfer Chair/bed transfer method: Squat pivot Chair/bed transfer assist level: Total assist (Pt < 25%) Chair/bed transfer assistive device: Mechanical lift Mechanical lift: Stedy Chair/bed transfer details: Manual facilitation for placement, Manual facilitation for weight bearing  Function - Locomotion: Wheelchair Will patient use wheelchair at discharge?: Yes Type: Manual Max wheelchair distance: 50 Assist Level: Moderate assistance (Pt 50 - 74%) Assist Level: Moderate assistance (Pt 50 - 74%) Assist Level: Dependent (Pt equals 0%) Turns around,maneuvers to table,bed, and toilet,negotiates 3% grade,maneuvers on rugs and over doorsills: No Function - Locomotion: Ambulation Ambulation activity did not occur: Safety/medical concerns (per report) Assistive device: Clarise Cruz plus Max distance: 10+10 Assist level: 2 helpers Walk 10 feet activity did not occur: Safety/medical concerns Assist level: 2 helpers Walk 50 feet with 2 turns activity did not occur: Safety/medical concerns Walk 150 feet activity did not occur: Safety/medical concerns Walk 10 feet on uneven surfaces activity did not occur: Safety/medical concerns  Function - Comprehension Comprehension: Auditory Comprehension assist level: Understands basic 75 - 89% of the time/ requires cueing 10 - 24% of the time  Function -  Expression Expression: Verbal Expression assist level: Expresses basic needs/ideas: With extra time/assistive device  Function - Social Interaction Social Interaction assist level: Interacts appropriately 75 - 89% of the time - Needs redirection for appropriate language or to initiate interaction.  Function - Problem Solving Problem solving assist level: Solves basic 75 - 89% of the time/requires cueing 10 - 24% of the time  Function - Memory Memory assist level: Recognizes or recalls 50 - 74% of the time/requires cueing 25 - 49% of the time Patient normally able to recall (first 3 days only): Current season, That he or she is in a hospital, Staff names and faces  Medical Problem List and Plan: 1.  Weakness, gait instability secondary to right internal capsule/thalamic hemorrhage. Left hemiataxia, cont CIR PT, OT                           2.  DVT Prophylaxis/Anticoagulation: Pharmaceutical: Lovenox, eventual plan for Eliquis 3. Pain Management: PRN tylenol, On tramadol ,oxycontin 15mg  BID aspercreme 4. Mood: LCSW to follow for evaluation and support. Ask Chaplain to visit 5. Neuropsych: This patient is capable of making decisions on his own behalf. 6. Skin/Wound Care: Routine pressure relief measures.              Will consider santly to left elbow wound.  7. Fluids/Electrolytes/Nutrition: Encourage fluid intake and pt will try ensure  Monitor I/O. Check lytes in am.  8. [id number] ON Norvasc, Lisinopril and Lasix daily. SBP goal<160.  Vitals:   08/16/16 2000 08/17/16 0522  BP: (!) 157/95 136/79  Pulse: 94 97  Resp: 17 18  Temp: 98.2 F (36.8 C) 98.2  F (36.8 C)    9. [id number] HR bid--has been controlled.  10. Pre-renal azotemia: Encourage po fluids. Add supplements between meals. Elevated BUN and creat  Stop prinivil and lasix , recheck BMET later in week  11. Chronic insomnia: sleeps in a recliner at nights. Pt states this is due to nasal congestion will try ocean  nasal spray 12. Hyperlipidemia: Pravastatin added this admission.  13.  Left hip pain severe  OA on xray, prn tramadol , cont Oxy CR 15mg  BID, pt more alert , received 15mg  oxyCR yest without issues  14. Leukocytosis afebrile no c/os, check UA  15.  Constipation increase senna S   LOS (Days) 15 A FACE TO FACE EVALUATION WAS PERFORMED  Josedaniel Haye E 08/17/2016, 7:38 AM

## 2016-08-17 NOTE — Consult Note (Addendum)
[facility] Nurse wound consult note Reason for Consult: Consult requested for left elbow and buttocks Wound type: Left elbow with previously noted full thickness wound;  at some point Santyl was ordered.  Currently it is healing and is 2X1X.1cm, 90% dry red wound bed, 10% yellow.  No odor or drainage, Santyl is not longer needed for enzymatic debridement. Inner gluteal fold with erythremia to area 2X1cm, with a dark purple deep tissue injury in the center. (.5X.5cm) Pressure Ulcer POA: No Dressing procedure/placement/frequency: Discontinue Santyl to elbow.  Foam dressing to left elbow and gluteal fold to protect from further injury and promote healing.  Deep tissue injuries are high risk to evolve into full thickness wounds despite optimal plan of care.  Pt is sitting in the chair with a pressure reducing cushion underneath.  Discussed importance of offloading off the affected area; no family present to discuss plan of care, Please re-consult if further assistance is needed.  Thank-you,  Julien Girt MSN, [place], [place], [place], [place]

## 2016-08-17 NOTE — Plan of Care (Signed)
Problem: RH Bed to Chair Transfers Goal: LTG Patient will perform bed/chair transfers w/assist (PT) LTG: Patient will perform bed/chair transfers with assistance, with/without cues (PT).  Outcome: Adequate for Discharge Pt requiring min>mod assist for sit<>stand by pulling up on stedy.  Expect pt to continue to progress with transfers without lift equipment in next setting.

## 2016-08-18 DIAGNOSIS — I69398 Other sequelae of cerebral infarction: Secondary | ICD-10-CM | POA: Diagnosis not present

## 2016-08-18 DIAGNOSIS — R278 Other lack of coordination: Secondary | ICD-10-CM | POA: Diagnosis not present

## 2016-08-18 DIAGNOSIS — N390 Urinary tract infection, site not specified: Secondary | ICD-10-CM

## 2016-08-18 DIAGNOSIS — R269 Unspecified abnormalities of gait and mobility: Secondary | ICD-10-CM | POA: Diagnosis not present

## 2016-08-18 DIAGNOSIS — R52 Pain, unspecified: Secondary | ICD-10-CM | POA: Diagnosis not present

## 2016-08-18 DIAGNOSIS — E785 Hyperlipidemia, unspecified: Secondary | ICD-10-CM | POA: Diagnosis not present

## 2016-08-18 DIAGNOSIS — K210 Gastro-esophageal reflux disease with esophagitis: Secondary | ICD-10-CM | POA: Diagnosis not present

## 2016-08-18 DIAGNOSIS — R262 Difficulty in walking, not elsewhere classified: Secondary | ICD-10-CM | POA: Diagnosis not present

## 2016-08-18 DIAGNOSIS — I69154 Hemiplegia and hemiparesis following nontraumatic intracerebral hemorrhage affecting left non-dominant side: Secondary | ICD-10-CM | POA: Diagnosis not present

## 2016-08-18 DIAGNOSIS — I69193 Ataxia following nontraumatic intracerebral hemorrhage: Secondary | ICD-10-CM | POA: Diagnosis not present

## 2016-08-18 DIAGNOSIS — I4891 Unspecified atrial fibrillation: Secondary | ICD-10-CM | POA: Diagnosis not present

## 2016-08-18 DIAGNOSIS — M150 Primary generalized (osteo)arthritis: Secondary | ICD-10-CM | POA: Diagnosis not present

## 2016-08-18 DIAGNOSIS — I619 Nontraumatic intracerebral hemorrhage, unspecified: Secondary | ICD-10-CM | POA: Diagnosis not present

## 2016-08-18 DIAGNOSIS — I6789 Other cerebrovascular disease: Secondary | ICD-10-CM | POA: Diagnosis not present

## 2016-08-18 DIAGNOSIS — I639 Cerebral infarction, unspecified: Secondary | ICD-10-CM | POA: Diagnosis not present

## 2016-08-18 DIAGNOSIS — K5903 Drug induced constipation: Secondary | ICD-10-CM

## 2016-08-18 DIAGNOSIS — R21 Rash and other nonspecific skin eruption: Secondary | ICD-10-CM | POA: Diagnosis not present

## 2016-08-18 DIAGNOSIS — I10 Essential (primary) hypertension: Secondary | ICD-10-CM | POA: Diagnosis not present

## 2016-08-18 DIAGNOSIS — R58 Hemorrhage, not elsewhere classified: Secondary | ICD-10-CM | POA: Diagnosis not present

## 2016-08-18 DIAGNOSIS — T402X5A Adverse effect of other opioids, initial encounter: Secondary | ICD-10-CM

## 2016-08-18 DIAGNOSIS — M79605 Pain in left leg: Secondary | ICD-10-CM | POA: Diagnosis not present

## 2016-08-18 DIAGNOSIS — G4700 Insomnia, unspecified: Secondary | ICD-10-CM | POA: Diagnosis not present

## 2016-08-18 DIAGNOSIS — G8194 Hemiplegia, unspecified affecting left nondominant side: Secondary | ICD-10-CM | POA: Diagnosis not present

## 2016-08-18 DIAGNOSIS — K5900 Constipation, unspecified: Secondary | ICD-10-CM | POA: Diagnosis not present

## 2016-08-18 DIAGNOSIS — J3089 Other allergic rhinitis: Secondary | ICD-10-CM | POA: Diagnosis not present

## 2016-08-18 DIAGNOSIS — L89150 Pressure ulcer of sacral region, unstageable: Secondary | ICD-10-CM | POA: Diagnosis not present

## 2016-08-18 DIAGNOSIS — M6281 Muscle weakness (generalized): Secondary | ICD-10-CM | POA: Diagnosis not present

## 2016-08-18 DIAGNOSIS — I482 Chronic atrial fibrillation: Secondary | ICD-10-CM | POA: Diagnosis not present

## 2016-08-18 MED ORDER — CEPHALEXIN 250 MG PO CAPS: 250.0000 mg | ORAL_CAPSULE | Freq: Three times a day (TID) | ORAL | Status: AC

## 2016-08-18 NOTE — Progress Notes (Signed)
Subjective/Complaints: Had BM, no urinary symptoms or sweating   ROS- neg CP, SOB, N/V/D, no abd pain Objective: Vital Signs: Blood pressure 115/62, pulse 96, temperature 98.3 F (36.8 C), temperature source Oral, resp. rate 18, height 5\' 6"  (1.676 m), weight 83.9 kg (184 lb 15.5 oz), SpO2 100 %. No results found. Results for orders placed or performed during the hospital encounter of 08/02/16 (from the past 72 hour(s))  CBC     Status: Abnormal   Collection Time: 08/17/16  5:32 AM  Result Value Ref Range   WBC 12.2 (H) 4.0 - 10.5 K/uL   RBC 4.01 (L) 4.22 - 5.81 MIL/uL   Hemoglobin 12.5 (L) 13.0 - 17.0 g/dL   HCT 38.2 (L) 39.0 - 52.0 %   MCV 95.3 78.0 - 100.0 fL   MCH 31.2 26.0 - 34.0 pg   MCHC 32.7 30.0 - 36.0 g/dL   RDW 14.2 11.5 - 15.5 %   Platelets 176 150 - 400 K/uL  Urinalysis, Routine w reflex microscopic (not at [facility])     Status: Abnormal   Collection Time: 08/17/16  9:11 AM  Result Value Ref Range   Color, Urine AMBER (A) YELLOW    Comment: BIOCHEMICALS MAY BE AFFECTED BY COLOR   APPearance CLOUDY (A) CLEAR   Specific Gravity, Urine 1.018 1.005 - 1.030   pH 5.5 5.0 - 8.0   Glucose, UA NEGATIVE NEGATIVE mg/dL   Hgb urine dipstick SMALL (A) NEGATIVE   Bilirubin Urine SMALL (A) NEGATIVE   Ketones, ur NEGATIVE NEGATIVE mg/dL   Protein, ur 30 (A) NEGATIVE mg/dL   Nitrite NEGATIVE NEGATIVE   Leukocytes, UA MODERATE (A) NEGATIVE  Urine microscopic-add on     Status: Abnormal   Collection Time: 08/17/16  9:11 AM  Result Value Ref Range   Squamous Epithelial / LPF 0-5 (A) NONE SEEN   WBC, UA TOO NUMEROUS TO COUNT 0 - 5 WBC/hpf   RBC / HPF 6-30 0 - 5 RBC/hpf   Bacteria, UA MANY (A) NONE SEEN      General: No acute distress Mood and affect are appropriate Heart: Regular rate and rhythm no rubs murmurs or extra sounds, - JVD Lungs: Clear to auscultation, breathing unlabored, no rales or wheezes Abdomen: Positive bowel sounds, soft nontender to palpation,  nondistended Extremities: No clubbing, cyanosis, Left pedal edema  Neurologic: Cranial nerves II through XII intact, motor strength is 5/5 in R deltoid, bicep, tricep, grip, hip flexor, knee extensors, ankle dorsiflexor and plantar flexor, 4/5 on left  Sensory exam normal sensation to light touch  bilateral upper and lower extremities Cerebellar exam normal finger to nose to finger as well as heel to shin in bilateral upperextremities Musculoskeletal: Pain with left hip abd , int/ext rotation, limited ROM oth hips, no pain to palp over knee, no effusion, + pain with Left hip ROM, Pedal edema, Assessment/Plan: 1. Functional deficits secondary to R IC/THalamic ICH  Stable for D/C today F/u PCP in 3-4 weeks F/u PM&R 2 weeks See D/C summary See D/C instructions FIM: Function - Bathing Position:  (seated in [place]) Body parts bathed by patient: Right arm, Left arm, Chest, Abdomen Body parts bathed by helper: Front perineal area, Buttocks, Back Bathing not applicable: Right upper leg, Left upper leg, Right lower leg, Left lower leg Assist Level: Touching or steadying assistance(Pt > 75%)  Function- Upper Body Dressing/Undressing What is the patient wearing?: Pull over shirt/dress Pull over shirt/dress - Perfomed by patient: Thread/unthread right sleeve Pull over  shirt/dress - Perfomed by helper: Pull shirt over trunk, Thread/unthread right sleeve, Thread/unthread left sleeve, Put head through opening Button up shirt - Perfomed by patient: Thread/unthread right sleeve Button up shirt - Perfomed by helper: Thread/unthread left sleeve, Pull shirt around back, Button/unbutton shirt Assist Level:  (supervision if just wearing a pull over shirt, Total assist if wearing a button up shirt) Function - Lower Body Dressing/Undressing What is the patient wearing?: Ted Hose, Non-skid slipper socks Position:  (recliner) Pants- Performed by patient: Thread/unthread right pants leg Pants- Performed by  helper: Thread/unthread right pants leg, Thread/unthread left pants leg, Pull pants up/down Non-skid slipper socks- Performed by helper: Don/doff right sock, Don/doff left sock TED Hose - Performed by helper: Don/doff right TED hose, Don/doff left TED hose Assist for footwear: Dependant Assist for lower body dressing: 2 Helpers  Function - Toileting Toileting activity did not occur: No continent bowel/bladder event Toileting steps completed by patient: Adjust clothing prior to toileting, Performs perineal hygiene, Adjust clothing after toileting Toileting steps completed by helper: Adjust clothing prior to toileting, Performs perineal hygiene, Adjust clothing after toileting Toileting Assistive Devices: Grab bar or rail Assist level: Two helpers  Function - Air cabin crew transfer assistive device: Bedside commode Mechanical lift: Stedy Assist level to toilet: Moderate assist (Pt 50 - 74%/lift or lower) Assist level from toilet: Moderate assist (Pt 50 - 74%/lift or lower) Assist level to bedside commode (at bedside):  (stedy) Assist level from bedside commode (at bedside):  (stedy)  Function - Chair/bed transfer Chair/bed transfer method: Other Chair/bed transfer assist level: Moderate assist (Pt 50 - 74%/lift or lower) Chair/bed transfer assistive device: Mechanical lift Mechanical lift: Stedy Chair/bed transfer details: Manual facilitation for placement, Manual facilitation for weight bearing  Function - Locomotion: Wheelchair Will patient use wheelchair at discharge?: Yes Type: Manual Wheelchair activity did not occur: Refused Max wheelchair distance: 50 Assist Level: Moderate assistance (Pt 50 - 74%) Assist Level: Moderate assistance (Pt 50 - 74%) Assist Level: Dependent (Pt equals 0%) Turns around,maneuvers to table,bed, and toilet,negotiates 3% grade,maneuvers on rugs and over doorsills: No Function - Locomotion: Ambulation Ambulation activity did not occur:  Refused Assistive device: Clarise Cruz plus Max distance: 10+10 Assist level: 2 helpers Walk 10 feet activity did not occur: Refused Assist level: 2 helpers Walk 50 feet with 2 turns activity did not occur: Refused Walk 150 feet activity did not occur: Refused Walk 10 feet on uneven surfaces activity did not occur: Refused  Function - Comprehension Comprehension: Auditory Comprehension assist level: Understands basic 75 - 89% of the time/ requires cueing 10 - 24% of the time  Function - Expression Expression: Verbal Expression assist level: Expresses basic needs/ideas: With extra time/assistive device  Function - Social Interaction Social Interaction assist level: Interacts appropriately 75 - 89% of the time - Needs redirection for appropriate language or to initiate interaction.  Function - Problem Solving Problem solving assist level: Solves basic 75 - 89% of the time/requires cueing 10 - 24% of the time  Function - Memory Memory assist level: Recognizes or recalls 50 - 74% of the time/requires cueing 25 - 49% of the time Patient normally able to recall (first 3 days only): Current season, That he or she is in a hospital, Staff names and faces  Medical Problem List and Plan: 1.  Weakness, gait instability secondary to right internal capsule/thalamic hemorrhage. Left hemiataxia, D/C to SNF today  2.  DVT Prophylaxis/Anticoagulation: Pharmaceutical: Lovenox, eventual plan for Eliquis 3. Pain Management: PRN tylenol, On tramadol ,oxycontin 15mg  BID aspercreme 4. Mood: LCSW to follow for evaluation and support. Ask Chaplain to visit 5. Neuropsych: This patient is capable of making decisions on his own behalf. 6. Skin/Wound Care: Routine pressure relief measures.              Will consider santly to left elbow wound.  7. Fluids/Electrolytes/Nutrition: Encourage fluid intake and pt will try ensure  Monitor I/O. Check lytes in am.  8. [id number] ON Norvasc,  Lisinopril and Lasix daily. SBP goal<160.  Vitals:   08/17/16 1300 08/18/16 0501  BP: 134/63 115/62  Pulse: 66 96  Resp: 16 18  Temp: 98.1 F (36.7 C) 98.3 F (36.8 C)    9. [id number] HR bid--has been controlled.  10. Pre-renal azotemia: Encourage po fluids. Add supplements between meals. Elevated BUN and creat  Stop prinivil and lasix , recheck BMET later in week  11. Chronic insomnia: sleeps in a recliner at nights. Pt states this is due to nasal congestion will try ocean nasal spray 12. Hyperlipidemia: Pravastatin added this admission.  13.  Left hip pain severe  OA on xray, prn tramadol , cont Oxy CR 15mg  BID, pt more alert , received 15mg  oxyCR yest without issues  14. Leukocytosis afebrile + UA , start Keflex pending cx 15.  Constipation increase senna S, had BM    LOS (Days) 16 A FACE TO FACE EVALUATION WAS PERFORMED  KIRSTEINS,ANDREW E 08/18/2016, 8:13 AM

## 2016-08-18 NOTE — Progress Notes (Addendum)
All patient's belongings packed by caregiver and wife, and taken by wife. Given report to Webb Silversmith, Therapist, sports at [facility]. Patient given Tramadol 50 mg for discomfort.  Transported by [employer]. Thomas Burnett

## 2016-08-18 NOTE — Progress Notes (Signed)
Social Work  Discharge Note  The overall goal for the admission was met for:   Discharge location: No-[facility]-SNF  Length of Stay: Yes-16 DAYS  Discharge activity level: Yes-MIN-MOD ASSIST LEVEL  Home/community participation: Yes  Services provided included: MD, RD, PT, OT, SLP, RN, CM, TR, Pharmacy, Neuropsych and SW  Financial Services: Medicare and Private Insurance: [place]  Follow-up services arranged: OTHER-[facility]  Comments (or additional information):PT NOT AT A LEVEL WIFE CAN [place]. PLAN TO GO TO [facility], PRIMARY MD TO FOLLOW HIM THERE.  Patient/Family verbalized understanding of follow-up arrangements: Yes  Individual responsible for coordination of the follow-up plan: BEVERLY-WIFE  Confirmed correct DME delivered: [name] 08/18/2016    [name]

## 2016-08-19 DIAGNOSIS — G8194 Hemiplegia, unspecified affecting left nondominant side: Secondary | ICD-10-CM | POA: Diagnosis not present

## 2016-08-19 DIAGNOSIS — N390 Urinary tract infection, site not specified: Secondary | ICD-10-CM | POA: Diagnosis not present

## 2016-08-19 DIAGNOSIS — I4891 Unspecified atrial fibrillation: Secondary | ICD-10-CM | POA: Diagnosis not present

## 2016-08-19 LAB — URINE CULTURE

## 2016-08-26 DIAGNOSIS — R21 Rash and other nonspecific skin eruption: Secondary | ICD-10-CM | POA: Diagnosis not present

## 2016-09-02 ENCOUNTER — Encounter: Payer: Self-pay | Admitting: Neurology

## 2016-09-08 DIAGNOSIS — I639 Cerebral infarction, unspecified: Secondary | ICD-10-CM | POA: Diagnosis not present

## 2016-09-08 DIAGNOSIS — I4891 Unspecified atrial fibrillation: Secondary | ICD-10-CM | POA: Diagnosis not present

## 2016-09-08 DIAGNOSIS — M79605 Pain in left leg: Secondary | ICD-10-CM | POA: Diagnosis not present

## 2016-10-21 DIAGNOSIS — I4891 Unspecified atrial fibrillation: Secondary | ICD-10-CM | POA: Diagnosis not present

## 2016-10-21 DIAGNOSIS — I619 Nontraumatic intracerebral hemorrhage, unspecified: Secondary | ICD-10-CM | POA: Diagnosis not present

## 2016-11-04 ENCOUNTER — Ambulatory Visit: Payer: Self-pay | Admitting: Neurology

## 2016-11-11 DIAGNOSIS — I4891 Unspecified atrial fibrillation: Secondary | ICD-10-CM | POA: Diagnosis not present

## 2016-11-11 DIAGNOSIS — I6930 Unspecified sequelae of cerebral infarction: Secondary | ICD-10-CM | POA: Diagnosis not present

## 2016-12-01 DIAGNOSIS — I771 Stricture of artery: Secondary | ICD-10-CM | POA: Diagnosis not present

## 2016-12-13 DIAGNOSIS — I69354 Hemiplegia and hemiparesis following cerebral infarction affecting left non-dominant side: Secondary | ICD-10-CM | POA: Diagnosis not present

## 2016-12-13 DIAGNOSIS — I4891 Unspecified atrial fibrillation: Secondary | ICD-10-CM | POA: Diagnosis not present

## 2016-12-13 DIAGNOSIS — I10 Essential (primary) hypertension: Secondary | ICD-10-CM | POA: Diagnosis not present

## 2017-01-12 DIAGNOSIS — I4891 Unspecified atrial fibrillation: Secondary | ICD-10-CM | POA: Diagnosis not present

## 2017-01-12 DIAGNOSIS — I10 Essential (primary) hypertension: Secondary | ICD-10-CM | POA: Diagnosis not present

## 2017-01-12 DIAGNOSIS — I6930 Unspecified sequelae of cerebral infarction: Secondary | ICD-10-CM | POA: Diagnosis not present

## 2017-01-18 ENCOUNTER — Emergency Department (HOSPITAL_COMMUNITY): Payer: Medicare Other

## 2017-01-18 ENCOUNTER — Inpatient Hospital Stay (HOSPITAL_COMMUNITY)
Admission: EM | Admit: 2017-01-18 | Discharge: 2017-02-01 | DRG: 871 | Disposition: E | Payer: Medicare Other | Attending: Family Medicine | Admitting: Family Medicine

## 2017-01-18 DIAGNOSIS — Z87891 Personal history of nicotine dependence: Secondary | ICD-10-CM

## 2017-01-18 DIAGNOSIS — R34 Anuria and oliguria: Secondary | ICD-10-CM | POA: Diagnosis present

## 2017-01-18 DIAGNOSIS — R6521 Severe sepsis with septic shock: Secondary | ICD-10-CM | POA: Diagnosis not present

## 2017-01-18 DIAGNOSIS — R400 Somnolence: Secondary | ICD-10-CM | POA: Diagnosis not present

## 2017-01-18 DIAGNOSIS — I351 Nonrheumatic aortic (valve) insufficiency: Secondary | ICD-10-CM | POA: Diagnosis present

## 2017-01-18 DIAGNOSIS — Z6829 Body mass index (BMI) 29.0-29.9, adult: Secondary | ICD-10-CM | POA: Diagnosis not present

## 2017-01-18 DIAGNOSIS — E669 Obesity, unspecified: Secondary | ICD-10-CM | POA: Diagnosis present

## 2017-01-18 DIAGNOSIS — I9589 Other hypotension: Secondary | ICD-10-CM

## 2017-01-18 DIAGNOSIS — I69154 Hemiplegia and hemiparesis following nontraumatic intracerebral hemorrhage affecting left non-dominant side: Secondary | ICD-10-CM

## 2017-01-18 DIAGNOSIS — I700 Atherosclerosis of aorta: Secondary | ICD-10-CM | POA: Diagnosis present

## 2017-01-18 DIAGNOSIS — R55 Syncope and collapse: Secondary | ICD-10-CM | POA: Diagnosis not present

## 2017-01-18 DIAGNOSIS — Z8546 Personal history of malignant neoplasm of prostate: Secondary | ICD-10-CM | POA: Diagnosis not present

## 2017-01-18 DIAGNOSIS — E876 Hypokalemia: Secondary | ICD-10-CM | POA: Diagnosis present

## 2017-01-18 DIAGNOSIS — I482 Chronic atrial fibrillation, unspecified: Secondary | ICD-10-CM | POA: Diagnosis present

## 2017-01-18 DIAGNOSIS — G8194 Hemiplegia, unspecified affecting left nondominant side: Secondary | ICD-10-CM | POA: Diagnosis not present

## 2017-01-18 DIAGNOSIS — Z66 Do not resuscitate: Secondary | ICD-10-CM | POA: Diagnosis present

## 2017-01-18 DIAGNOSIS — I4891 Unspecified atrial fibrillation: Secondary | ICD-10-CM | POA: Diagnosis not present

## 2017-01-18 DIAGNOSIS — N179 Acute kidney failure, unspecified: Secondary | ICD-10-CM | POA: Diagnosis not present

## 2017-01-18 DIAGNOSIS — R404 Transient alteration of awareness: Secondary | ICD-10-CM | POA: Diagnosis not present

## 2017-01-18 DIAGNOSIS — Z515 Encounter for palliative care: Secondary | ICD-10-CM | POA: Diagnosis not present

## 2017-01-18 DIAGNOSIS — D649 Anemia, unspecified: Secondary | ICD-10-CM | POA: Diagnosis present

## 2017-01-18 DIAGNOSIS — I10 Essential (primary) hypertension: Secondary | ICD-10-CM | POA: Diagnosis present

## 2017-01-18 DIAGNOSIS — R4182 Altered mental status, unspecified: Secondary | ICD-10-CM

## 2017-01-18 DIAGNOSIS — M8580 Other specified disorders of bone density and structure, unspecified site: Secondary | ICD-10-CM | POA: Diagnosis present

## 2017-01-18 DIAGNOSIS — Z888 Allergy status to other drugs, medicaments and biological substances status: Secondary | ICD-10-CM

## 2017-01-18 DIAGNOSIS — A419 Sepsis, unspecified organism: Secondary | ICD-10-CM | POA: Diagnosis not present

## 2017-01-18 DIAGNOSIS — I959 Hypotension, unspecified: Secondary | ICD-10-CM

## 2017-01-18 DIAGNOSIS — R0902 Hypoxemia: Secondary | ICD-10-CM | POA: Diagnosis present

## 2017-01-18 DIAGNOSIS — R031 Nonspecific low blood-pressure reading: Secondary | ICD-10-CM | POA: Diagnosis not present

## 2017-01-18 DIAGNOSIS — I6789 Other cerebrovascular disease: Secondary | ICD-10-CM | POA: Diagnosis not present

## 2017-01-18 LAB — CBC WITH DIFFERENTIAL/PLATELET
Basophils Absolute: 0 10*3/uL (ref 0.0–0.1)
Basophils Relative: 0 %
Eosinophils Absolute: 0 10*3/uL (ref 0.0–0.7)
Eosinophils Relative: 0 %
HCT: 30.1 % — ABNORMAL LOW (ref 39.0–52.0)
Hemoglobin: 9.5 g/dL — ABNORMAL LOW (ref 13.0–17.0)
Lymphocytes Relative: 5 %
Lymphs Abs: 0.9 10*3/uL (ref 0.7–4.0)
MCH: 31 pg (ref 26.0–34.0)
MCHC: 31.6 g/dL (ref 30.0–36.0)
MCV: 98.4 fL (ref 78.0–100.0)
Monocytes Absolute: 0.6 10*3/uL (ref 0.1–1.0)
Monocytes Relative: 3 %
Neutro Abs: 17.2 10*3/uL — ABNORMAL HIGH (ref 1.7–7.7)
Neutrophils Relative %: 92 %
Platelets: 134 10*3/uL — ABNORMAL LOW (ref 150–400)
RBC: 3.06 MIL/uL — ABNORMAL LOW (ref 4.22–5.81)
RDW: 15.6 % — ABNORMAL HIGH (ref 11.5–15.5)
WBC Morphology: INCREASED
WBC: 18.7 10*3/uL — ABNORMAL HIGH (ref 4.0–10.5)

## 2017-01-18 LAB — COMPREHENSIVE METABOLIC PANEL
ALT: 13 U/L — ABNORMAL LOW (ref 17–63)
AST: 26 U/L (ref 15–41)
Albumin: 1.6 g/dL — ABNORMAL LOW (ref 3.5–5.0)
Alkaline Phosphatase: 90 U/L (ref 38–126)
Anion gap: 11 (ref 5–15)
BUN: 32 mg/dL — ABNORMAL HIGH (ref 6–20)
CO2: 12 mmol/L — ABNORMAL LOW (ref 22–32)
Calcium: 6 mg/dL — CL (ref 8.9–10.3)
Chloride: 120 mmol/L — ABNORMAL HIGH (ref 101–111)
Creatinine, Ser: 2.01 mg/dL — ABNORMAL HIGH (ref 0.61–1.24)
GFR calc Af Amer: 31 mL/min — ABNORMAL LOW (ref >60)
GFR calc non Af Amer: 27 mL/min — ABNORMAL LOW (ref >60)
Glucose, Bld: 73 mg/dL (ref 65–99)
Potassium: 3 mmol/L — ABNORMAL LOW (ref 3.5–5.1)
Sodium: 143 mmol/L (ref 135–145)
Total Bilirubin: 0.9 mg/dL (ref 0.3–1.2)
Total Protein: 3.5 g/dL — ABNORMAL LOW (ref 6.5–8.1)

## 2017-01-18 LAB — I-STAT CG4 LACTIC ACID, ED
Lactic Acid, Venous: 6.78 mmol/L (ref 0.5–1.9)
Lactic Acid, Venous: 9.46 mmol/L (ref 0.5–1.9)

## 2017-01-18 LAB — I-STAT TROPONIN, ED: Troponin i, poc: 0.19 ng/mL (ref 0.00–0.08)

## 2017-01-18 MED ORDER — GLYCOPYRROLATE 0.2 MG/ML IJ SOLN: 0.2000 mg | INTRAMUSCULAR | Status: DC | PRN

## 2017-01-18 MED ORDER — LORAZEPAM 2 MG/ML IJ SOLN
0.5000 mg | Freq: Once | INTRAMUSCULAR | Status: AC
  Administered 2017-01-18: 0.5 mg via INTRAVENOUS
  Filled 2017-01-18: qty 1

## 2017-01-18 MED ORDER — ONDANSETRON HCL 4 MG/2ML IJ SOLN: 4.0000 mg | Freq: Four times a day (QID) | INTRAMUSCULAR | Status: DC | PRN

## 2017-01-18 MED ORDER — LORAZEPAM 2 MG/ML IJ SOLN: 1.0000 mg | INTRAMUSCULAR | Status: DC | PRN

## 2017-01-18 MED ORDER — SODIUM CHLORIDE 0.9% FLUSH: 3.0000 mL | INTRAVENOUS | Status: DC | PRN

## 2017-01-18 MED ORDER — SODIUM CHLORIDE 0.9% FLUSH: 3.0000 mL | Freq: Two times a day (BID) | INTRAVENOUS | Status: DC

## 2017-01-18 MED ORDER — SODIUM CHLORIDE 0.9 % IV SOLN: 250.0000 mL | INTRAVENOUS | Status: DC | PRN

## 2017-01-18 MED ORDER — GLYCOPYRROLATE 0.2 MG/ML IJ SOLN
0.2000 mg | INTRAMUSCULAR | Status: DC | PRN
  Filled 2017-01-18: qty 1

## 2017-01-18 MED ORDER — HALOPERIDOL LACTATE 2 MG/ML PO CONC: 0.5000 mg | ORAL | Status: DC | PRN

## 2017-01-18 MED ORDER — POLYVINYL ALCOHOL 1.4 % OP SOLN
1.0000 [drp] | Freq: Four times a day (QID) | OPHTHALMIC | Status: DC | PRN
  Filled 2017-01-18: qty 15

## 2017-01-18 MED ORDER — DIPHENHYDRAMINE HCL 50 MG/ML IJ SOLN: 12.5000 mg | INTRAMUSCULAR | Status: DC | PRN

## 2017-01-18 MED ORDER — HALOPERIDOL LACTATE 5 MG/ML IJ SOLN: 0.5000 mg | INTRAMUSCULAR | Status: DC | PRN

## 2017-01-18 MED ORDER — HALOPERIDOL 1 MG PO TABS
0.5000 mg | ORAL_TABLET | ORAL | Status: DC | PRN
  Filled 2017-01-18: qty 0.5

## 2017-01-18 MED ORDER — ACETAMINOPHEN 650 MG RE SUPP: 650.0000 mg | Freq: Four times a day (QID) | RECTAL | Status: DC | PRN

## 2017-01-18 MED ORDER — SODIUM CHLORIDE 0.9 % IV SOLN
2.0000 mg/h | INTRAVENOUS | Status: DC
  Administered 2017-01-18: 4 mg/h via INTRAVENOUS
  Filled 2017-01-18: qty 10

## 2017-01-18 MED ORDER — PIPERACILLIN-TAZOBACTAM 3.375 G IVPB 30 MIN
3.3750 g | Freq: Once | INTRAVENOUS | Status: AC
  Administered 2017-01-18: 3.375 g via INTRAVENOUS
  Filled 2017-01-18: qty 50

## 2017-01-18 MED ORDER — SODIUM CHLORIDE 0.9 % IV BOLUS (SEPSIS)
1000.0000 mL | Freq: Once | INTRAVENOUS | Status: AC
  Administered 2017-01-18: 1000 mL via INTRAVENOUS

## 2017-01-18 MED ORDER — ACETAMINOPHEN 325 MG PO TABS: 650.0000 mg | ORAL_TABLET | Freq: Four times a day (QID) | ORAL | Status: DC | PRN

## 2017-01-18 MED ORDER — BIOTENE DRY MOUTH MT LIQD: 15.0000 mL | OROMUCOSAL | Status: DC | PRN

## 2017-01-18 MED ORDER — MORPHINE SULFATE (CONCENTRATE) 10 MG/0.5ML PO SOLN: 5.0000 mg | ORAL | Status: DC | PRN

## 2017-01-18 MED ORDER — POLYVINYL ALCOHOL 1.4 % OP SOLN: 1.0000 [drp] | Freq: Four times a day (QID) | OPHTHALMIC | Status: DC | PRN

## 2017-01-18 MED ORDER — PIPERACILLIN-TAZOBACTAM 3.375 G IVPB: 3.3750 g | Freq: Three times a day (TID) | INTRAVENOUS | Status: DC

## 2017-01-18 MED ORDER — MORPHINE BOLUS VIA INFUSION
2.0000 mg | INTRAVENOUS | Status: DC | PRN
  Administered 2017-01-18 (×2): 2 mg via INTRAVENOUS
  Filled 2017-01-18: qty 4

## 2017-01-18 MED ORDER — NYSTATIN 100000 UNIT/GM EX POWD: Freq: Three times a day (TID) | CUTANEOUS | Status: DC | PRN

## 2017-01-18 MED ORDER — ONDANSETRON 4 MG PO TBDP
4.0000 mg | ORAL_TABLET | Freq: Four times a day (QID) | ORAL | Status: DC | PRN
  Filled 2017-01-18: qty 1

## 2017-01-18 MED ORDER — VANCOMYCIN HCL IN DEXTROSE 1-5 GM/200ML-% IV SOLN: 1000.0000 mg | INTRAVENOUS | Status: DC

## 2017-01-18 MED ORDER — ONDANSETRON 4 MG PO TBDP: 4.0000 mg | ORAL_TABLET | Freq: Four times a day (QID) | ORAL | Status: DC | PRN

## 2017-01-18 MED ORDER — HALOPERIDOL 1 MG PO TABS: 0.5000 mg | ORAL_TABLET | ORAL | Status: DC | PRN

## 2017-01-18 MED ORDER — SODIUM CHLORIDE 0.9 % IV BOLUS (SEPSIS)
500.0000 mL | Freq: Once | INTRAVENOUS | Status: AC
  Administered 2017-01-18: 500 mL via INTRAVENOUS

## 2017-01-18 MED ORDER — GLYCOPYRROLATE 1 MG PO TABS
1.0000 mg | ORAL_TABLET | ORAL | Status: DC | PRN
  Filled 2017-01-18: qty 1

## 2017-01-18 MED ORDER — LORAZEPAM 1 MG PO TABS: 1.0000 mg | ORAL_TABLET | ORAL | Status: DC | PRN

## 2017-01-18 MED ORDER — GLYCOPYRROLATE 1 MG PO TABS: 1.0000 mg | ORAL_TABLET | ORAL | Status: DC | PRN

## 2017-01-18 MED ORDER — IPRATROPIUM-ALBUTEROL 0.5-2.5 (3) MG/3ML IN SOLN: 3.0000 mL | Freq: Four times a day (QID) | RESPIRATORY_TRACT | Status: DC

## 2017-01-18 MED ORDER — LORAZEPAM 2 MG/ML PO CONC: 1.0000 mg | ORAL | Status: DC | PRN

## 2017-01-18 MED ORDER — FENTANYL CITRATE (PF) 100 MCG/2ML IJ SOLN: 25.0000 ug | INTRAMUSCULAR | Status: DC | PRN

## 2017-01-18 MED ORDER — VANCOMYCIN HCL IN DEXTROSE 1-5 GM/200ML-% IV SOLN: 1000.0000 mg | Freq: Once | INTRAVENOUS | Status: DC

## 2017-01-18 MED ORDER — SODIUM CHLORIDE 0.9 % IV SOLN
1500.0000 mg | Freq: Once | INTRAVENOUS | Status: AC
  Administered 2017-01-18: 1500 mg via INTRAVENOUS
  Filled 2017-01-18: qty 1500

## 2017-01-18 MED ORDER — HALOPERIDOL LACTATE 2 MG/ML PO CONC
0.5000 mg | ORAL | Status: DC | PRN
  Filled 2017-01-18: qty 0.3

## 2017-01-18 MED ORDER — GLYCOPYRROLATE 0.2 MG/ML IJ SOLN
0.4000 mg | Freq: Three times a day (TID) | INTRAMUSCULAR | Status: DC
  Administered 2017-01-18: 0.4 mg via INTRAVENOUS
  Filled 2017-01-18 (×2): qty 2

## 2017-01-18 NOTE — ED Provider Notes (Signed)
[facility] DEPT Provider Note   CSN: [id number] Arrival date & time: 01/08/2017  1105     History   Chief Complaint Chief Complaint  Patient presents with  . Altered Mental Status    HPI Thomas Burnett is a 83 y.o. male.  HPI   level 5 caveat due to altered mental status  83 year old male presents today via EMS with altered mental status.  Per EMS patient was found him unresponsive this morning by staff between 8 and 830.  They did an EKG on him which showed some abnormalities and was subsequently sent over here.  Per EMS they arrived patient was shallow breathing at approximately 8 times per minute, he was on 2 L supplemental oxygen at that time and was unresponsive with small pupils.  Patient was severely hypotensive per EMS at that time.  They started an epinephrine drip, gave Narcan and a small bolus of fluid, patients breathing improved, still unresponsive.  No other information was obtained.  Per documentation patient suffered acute thalamic hemorrhage on 07/28/2016 with resulting left hemiparesis.  Patient with continued left upper extremity ataxia.   Past Medical History:  Diagnosis Date  . Hypertension   . Mild aortic insufficiency   . Mild aortic sclerosis ([place])   . Obesity   . Osteopenia   . Prostate cancer ([place])    STAGE 2  . Right bundle branch block (RBBB)   . Vitamin D deficiency     Patient Active Problem List   Diagnosis Date Noted  . Severe sepsis with septic shock ([place]) 01/08/2017  . Acute kidney injury ([place]) 01/10/2017  . Decubitus ulcer of sacral region, unstageable ([place]) 08/18/2016  . UTI (urinary tract infection) 08/18/2016  . Therapeutic opioid-induced constipation (OIC) 08/18/2016  . Hypertensive emergency 08/02/2016  . Chronic a-fib ([place]) 08/02/2016  . Hyperlipidemia 08/02/2016  . Obesity 08/02/2016  . Prostate cancer ([place]) 08/02/2016  . Right bundle branch block 08/02/2016  . Nontraumatic thalamic hemorrhage ([place]) 08/02/2016  . Gait  disturbance, post-stroke   . Left hemiparesis ([place])   . Open wound   . Benign essential HTN   . Chronic atrial fibrillation ([place])   . Prerenal azotemia   . Primary insomnia   . Mixed hyperlipidemia   . ICH (intracerebral hemorrhage) ([facility]) - small R thalamic d/t HTN 07/28/2016    No past surgical history on file.     Home Medications    Prior to Admission medications   Medication Sig Start Date End Date Taking? Authorizing Provider  acetaminophen (TYLENOL) 325 MG tablet Take 2 tablets (650 mg total) by mouth every 4 (four) hours as needed for mild pain (or temp > 99 F). 08/17/16   Ivan Anchors Love, PA-C  amLODipine (NORVASC) 5 MG tablet Take 1 tablet (5 mg total) by mouth daily. 08/18/16   Bary Leriche, PA-C  cephALEXin (KEFLEX) 250 MG capsule Take 1 capsule (250 mg total) by mouth every 8 (eight) hours. 08/18/16   Bary Leriche, PA-C  Cholecalciferol (VITAMIN D-3) 1000 UNITS CAPS Take 1,000 Units by mouth daily.     Historical Provider, MD  furosemide (LASIX) 20 MG tablet Take 20 mg by mouth daily. 07/06/16   Historical Provider, MD  loratadine (CLARITIN) 10 MG tablet Take 10 mg by mouth daily.    Historical Provider, MD  Menthol-Methyl Salicylate (MUSCLE RUB) 10-15 % CREA Apply 1 application topically 3 (three) times daily. To bilateral  Hips/thighs. 08/17/16   Bary Leriche, PA-C  oxyCODONE (OXYCONTIN) 15  mg 12 hr tablet Take 1 tablet (15 mg total) by mouth every 12 (twelve) hours. 08/17/16   Bary Leriche, PA-C  pantoprazole (PROTONIX) 40 MG tablet Take 1 tablet (40 mg total) by mouth daily. 08/18/16   Bary Leriche, PA-C  pravastatin (PRAVACHOL) 40 MG tablet Take 1 tablet (40 mg total) by mouth daily at 6 PM. 08/17/16   Ivan Anchors Love, PA-C  senna-docusate (SENOKOT-S) 8.6-50 MG tablet Take 3 tablets by mouth 2 (two) times daily. 08/17/16   Bary Leriche, PA-C  sodium phosphate (FLEET) 7-19 GM/118ML ENEM Place 133 mLs (1 enema total) rectally daily as needed for severe constipation.  08/17/16   Bary Leriche, PA-C  traMADol (ULTRAM) 50 MG tablet Take 1 tablet (50 mg total) by mouth every 6 (six) hours as needed for moderate pain. 08/17/16   Bary Leriche, PA-C  traZODone (DESYREL) 50 MG tablet Take 0.5-1 tablets (25-50 mg total) by mouth at bedtime as needed for sleep. 08/17/16   Bary Leriche, PA-C    Family History No family history on file.  Social History Social History  Substance Use Topics  . Smoking status: Former Research scientist (life sciences)  . Smokeless tobacco: Never Used  . Alcohol use Yes     Allergies   Hydralazine and Xarelto [rivaroxaban]   Review of Systems Review of Systems  Unable to perform ROS: Patient unresponsive    Physical Exam Updated Vital Signs BP (!) 61/48   Pulse 64   Temp 99.7 F (37.6 C) (Rectal)   Resp (!) 21   Wt 83.5 kg   SpO2 (!) 82%   BMI 29.70 kg/m   Physical Exam  HENT:  Head: Atraumatic.  Neck: No JVD present. No tracheal deviation present.  Cardiovascular:  Irregularly irregular rhythm  Pulmonary/Chest: Breath sounds normal. No stridor.  Labored breathing   Abdominal: Soft. He exhibits no distension.  Neurological:  Minimally responsive to pain  Psychiatric: He has a normal mood and affect. His behavior is normal. Judgment and thought content normal.  Nursing note and vitals reviewed.   ED Treatments / Results  Labs (all labs ordered are listed, but only abnormal results are displayed) Labs Reviewed  COMPREHENSIVE METABOLIC PANEL - Abnormal; Notable for the following:       Result Value   Potassium 3.0 (*)    Chloride 120 (*)    CO2 12 (*)    BUN 32 (*)    Creatinine, Ser 2.01 (*)    Calcium 6.0 (*)    Total Protein 3.5 (*)    Albumin 1.6 (*)    ALT 13 (*)    GFR calc non Af Amer 27 (*)    GFR calc Af Amer 31 (*)    All other components within normal limits  CBC WITH DIFFERENTIAL/PLATELET - Abnormal; Notable for the following:    WBC 18.7 (*)    RBC 3.06 (*)    Hemoglobin 9.5 (*)    HCT 30.1 (*)    RDW  15.6 (*)    Platelets 134 (*)    Neutro Abs 17.2 (*)    All other components within normal limits  I-STAT CG4 LACTIC ACID, ED - Abnormal; Notable for the following:    Lactic Acid, Venous 6.78 (*)    All other components within normal limits  I-STAT TROPOININ, ED - Abnormal; Notable for the following:    Troponin i, poc 0.19 (*)    All other components within normal limits  I-STAT CG4 LACTIC  ACID, ED - Abnormal; Notable for the following:    Lactic Acid, Venous 9.46 (*)    All other components within normal limits  CULTURE, BLOOD (ROUTINE X 2)  CULTURE, BLOOD (ROUTINE X 2)  URINALYSIS, ROUTINE W REFLEX MICROSCOPIC    EKG  EKG Interpretation None       Radiology Ct Head Wo Contrast  Result Date: 01/12/2017 CLINICAL DATA:  Unresponsive. EXAM: CT HEAD WITHOUT CONTRAST TECHNIQUE: Contiguous axial images were obtained from the base of the skull through the vertex without intravenous contrast. COMPARISON:  07/29/2016 FINDINGS: Brain: There is no evidence for acute hemorrhage, hydrocephalus, mass lesion, or abnormal extra-axial fluid collection. No definite CT evidence for acute infarction. Diffuse loss of parenchymal volume is consistent with atrophy. Patchy low attenuation in the deep hemispheric and periventricular white matter is nonspecific, but likely reflects chronic microvascular ischemic demyelination. Right basal ganglia hematoma seen previously has resolved in the interval. Vascular: Atherosclerotic calcification is visualized in the intracranial segments of the internal carotid arteries. No dense MCA sign. Major dural sinuses are unremarkable. Skull: No evidence for fracture. No worrisome lytic or sclerotic lesion. Sinuses/Orbits: The visualized paranasal sinuses and mastoid air cells are clear. Visualized portions of the globes and intraorbital fat are unremarkable. Other: None. IMPRESSION: Motion degraded study without acute intracranial abnormality. Atrophy with chronic small  vessel white matter ischemic disease. Electronically Signed   By: Misty Stanley M.D.   On: 01/17/2017 11:42    Procedures Procedures (including critical care time)  CRITICAL CARE Performed by: Elmer Ramp   Total critical care time: 45 minutes  Critical care time was exclusive of separately billable procedures and treating other patients.  Critical care was necessary to treat or prevent imminent or life-threatening deterioration.  Critical care was time spent personally by me on the following activities: development of treatment plan with patient and/or surrogate as well as nursing, discussions with consultants, evaluation of patient's response to treatment, examination of patient, obtaining history from patient or surrogate, ordering and performing treatments and interventions, ordering and review of laboratory studies, ordering and review of radiographic studies, pulse oximetry and re-evaluation of patient's condition. Medications Ordered in ED Medications  vancomycin (VANCOCIN) IVPB 1000 mg/200 mL premix (not administered)  piperacillin-tazobactam (ZOSYN) IVPB 3.375 g (not administered)  sodium chloride 0.9 % bolus 1,000 mL (0 mLs Intravenous Stopped 01/21/2017 1205)  sodium chloride 0.9 % bolus 1,000 mL (0 mLs Intravenous Stopped 01/24/2017 1410)  sodium chloride 0.9 % bolus 500 mL (0 mLs Intravenous Stopped 01/10/2017 1410)  piperacillin-tazobactam (ZOSYN) IVPB 3.375 g (0 g Intravenous Stopped 01/22/2017 1250)  vancomycin (VANCOCIN) 1,500 mg in sodium chloride 0.9 % 500 mL IVPB (0 mg Intravenous Stopped 01/10/2017 1458)  LORazepam (ATIVAN) injection 0.5 mg (0.5 mg Intravenous Given 01/12/2017 1302)  sodium chloride 0.9 % bolus 1,000 mL (1,000 mLs Intravenous New Bag/Given 01/11/2017 1421)     Initial Impression / Assessment and Plan / ED Course  I have reviewed the triage vital signs and the nursing notes.  Pertinent labs & imaging results that were available during my care of the patient  were reviewed by me and considered in my medical decision making (see chart for details).     Final Clinical Impressions(s) / ED Diagnoses   Final diagnoses:  Altered mental status    Labs: Lactic acid, i-STAT troponin, CMP and CBC, blood culture  Imaging: CT head, DG chest  Consults: Hospitalist service  Therapeutics: Vancomycin and Zosyn, normal saline, Ativan  Discharge Meds:  Assessment/Plan: 84 year old male presents today with altered mental status.  Uncertain etiology of his altered state, patient is nonresponsive to both verbal and painful stimuli.  Patient hypertensive here with 82/50 manually, he is receiving normal saline.  Sepsis labs ordered with head CT chest x-ray.  Patient does have a history of A. fib, not currently on anticoagulation, he has a history of hemorrhagic stroke in the past.  Differential includes stroke both hemorrhagic and ischemic, sepsis, medication overdose.  I spoke with patient's wife on the phone and discussed his current condition.  When discussing his requests and their discussion they feel that DNR is most appropriate.  I discussed intubation, CPR, vasopressors measures with patients wife.  She would not like any of the above.  She is agreed to continue with fluid resuscitation and IV antibiotics.  She is status post knee replacement and is nonambulatory she will attempt to come down to the emergency room, patient's daughter will be coming to the emergency room as well.  3:12 PM patient's daughter is at bedside.  She concurs with mother about the DO NOT RESUSCITATE efforts.  They would like [place] to keep him comfortable with no significant intervention other than IV fluids and antibiotics.  Palliative medicine will be consulted, hospital will counseled for admission pending remaining workup.  [facility] to see patient for admission; palliative care placed, case management consulted    New Prescriptions New Prescriptions   No medications on file       Okey Regal, PA-C 01/07/2017 [place], MD 01/22/17 1623

## 2017-01-18 NOTE — Consult Note (Signed)
Consultation Note Date: 01/29/2017   Patient Name: Thomas Burnett  DOB: 09-30-1920  MRN: [id number]  Age / Sex: 83 y.o., male  PCP: Lajean Manes, MD Referring Physician: Waldemar Dickens, MD  Reason for Consultation: Establishing goals of care, Non pain symptom management, Pain control, Psychosocial/spiritual support, Terminal Care and Withdrawal of life-sustaining treatment  HPI/Patient Profile: 83 y.o. male  with past medical history of prostate, CVA with residual left hemiparesis, Afib who was admitted on 01/24/2017 with septic shock.  In the ER his systolic blood pressure was in the 50's - 60's after fluid resuscitation and his lactic acid was 9.6.     Clinical Assessment and Goals of Care: After reviewing EPIC notes and labs as well as his previous discharge summary I went the the ER and received report from Dr. Colvin Caroli, [place].  I then examined the patient and met at bedside with the patient and his family.  The patient was non-responsive to my voice and light touch.  His family Eulas Post, Jeani Hawking and Bobby Rumpf) at bed side told me that Mr. Delapena had decided 6 weeks ago not to get out of bed anymore.  He has had a great deal of pain in his left side and has been asking why God is not taking him yet.   Mr. Pollan has a deep [employer].    I explained that his time was limited no matter which choices we made - but we could choose to attempt to prolong his life with more aggressive medical interventions, or we could choose to make him comfortable and he would likely pass within hours to days.   The family unanimously chose to make Mr. Fredericksen comfortable rather than prolonging his life with further intervention.  Primary Decision Maker:  NEXT OF KIN Wife, [name].    SUMMARY OF RECOMMENDATIONS    Code Status/Advance Care Planning:  DNR    Symptom Management:   Morphine GTT with PRN bolus for SOB or  pain  Ativan PRN  Robinul PRN for secretions  Additional Recommendations (Limitations, Scope, Preferences):  Full Comfort Care  Palliative Prophylaxis:   Frequent Pain Assessment  Psycho-social/Spiritual:   Desire for further Chaplaincy support: the family's minister is at bedside.  Prognosis:   Hours - Days  Discharge Planning: Anticipated Hospital Death      Primary Diagnoses: Present on Admission: . Chronic a-fib ([place]) . Left hemiparesis ([place]) . Severe sepsis with septic shock (CODE) ([place])   I have reviewed the medical record, interviewed the patient and family, and examined the patient. The following aspects are pertinent.  Past Medical History:  Diagnosis Date  . Hypertension   . Mild aortic insufficiency   . Mild aortic sclerosis ([place])   . Obesity   . Osteopenia   . Prostate cancer ([place])    STAGE 2  . Right bundle branch block (RBBB)   . Vitamin D deficiency    Social History   Social History  . Marital status: Married    Spouse name: N/A  .  Number of children: N/A  . Years of education: N/A   Social History Main Topics  . Smoking status: Former Research scientist (life sciences)  . Smokeless tobacco: Never Used  . Alcohol use Yes  . Drug use: No  . Sexual activity: Not on file   Other Topics Concern  . Not on file   Social History Narrative  . No narrative on file   No family history on file. Scheduled Meds: Continuous Infusions: . morphine     PRN Meds:.acetaminophen **OR** acetaminophen, antiseptic oral rinse, glycopyrrolate **OR** glycopyrrolate **OR** glycopyrrolate, haloperidol **OR** haloperidol **OR** haloperidol lactate, LORazepam **OR** LORazepam **OR** LORazepam, morphine, morphine CONCENTRATE **OR** morphine CONCENTRATE, ondansetron **OR** ondansetron (ZOFRAN) IV, polyvinyl alcohol, polyvinyl alcohol Allergies  Allergen Reactions  . Hydralazine Itching  . Xarelto [Rivaroxaban] Hives   Review of Systems patient not speaking or conscious  Physical  Exam  Well developed elderly male, currently not responsive to voice or touch - poor color  CV regular with murmur resp on non rebreather mask with mild to moderate increased work of breathing. Abdomen soft, non distended Extremities:  LUE with edema and weeping.    Vital Signs: BP (!) 61/48   Pulse 64   Temp 99.7 F (37.6 C) (Rectal)   Resp (!) 21   Wt 83.5 kg (184 lb)   SpO2 (!) 82%   BMI 29.70 kg/m          SpO2: SpO2: (!) 82 % O2 Device:SpO2: (!) 82 % O2 Flow Rate: .   IO: Intake/output summary:  Intake/Output Summary (Last 24 hours) at 01/27/2017 1553 Last data filed at 01/27/2017 1458  Gross per 24 hour  Intake             3050 ml  Output                0 ml  Net             3050 ml    LBM:   Baseline Weight: Weight: 83.5 kg (184 lb) Most recent weight: Weight: 83.5 kg (184 lb)     Palliative Assessment/Data:   Flowsheet Rows     Most Recent Value  Intake Tab  Referral Department  Hospitalist  Unit at Time of Referral  ER  Palliative Care Primary Diagnosis  Sepsis/Infectious Disease  Date Notified  01/06/2017  Palliative Care Type  New Palliative care  Reason for referral  Clarify Goals of Care, End of Life Care Assistance, Pain, Non-pain Symptom  Date of Admission  01/11/2017  Date first seen by Palliative Care  01/11/2017  # of days Palliative referral response time  0 Day(s)  # of days IP prior to Palliative referral  0  Clinical Assessment  Palliative Performance Scale Score  10%  Psychosocial & Spiritual Assessment  Palliative Care Outcomes  Patient/Family meeting held?  Yes  Who was at the meeting?  sister, grandson and wife by phone [name], Andreas Blower and Bobby Rumpf)  [place] goals of care, Provided end of life care assistance, Changed to focus on comfort  Patient/Family wishes: Interventions discontinued/not started   Mechanical Ventilation, PEG, BiPAP, Tube feedings/TPN, Antibiotics      Time In: 3:00 Time Out:  4:10 Time Total: 70 min. Greater than 50%  of this time was spent counseling and coordinating care related to the above assessment and plan.  Discussed with EDP and [facility] admitting team.  Signed by: Imogene Burn, PA-C Palliative Medicine Pager: [phone]  Please contact Palliative Medicine Team  phone at [phone] for questions and concerns.  For individual provider: See Shea Evans

## 2017-01-18 NOTE — ED Notes (Signed)
Family does not want portable Xray done.  EDP made aware.  Ordered discontinued.

## 2017-01-18 NOTE — ED Notes (Signed)
EDP made aware of patient's lactic acid of 6.78.

## 2017-01-18 NOTE — ED Notes (Signed)
EDP made aware of patient not producing urine during in and out cath and notified of patient's low BP.  EDP ordered another bolus after the 2541ml.

## 2017-01-18 NOTE — H&P (Signed)
History and Physical    Thomas Burnett YDX:[id number] DOB: 06-26-1920 DOA: 01/15/2017  PCP: Mathews Argyle, MD   Patient coming from: SNF where he resides for post stroke rehabilitation  Chief Complaint: Altered Mental Status  HPI: Thomas Burnett is a 83 y.o. male with medical history significant of HTN, valvular heart , recent stroke in October 2017 with residual left sided hemiparesis, prostate CA, chronic afib who was delivered from the SNF to the ED after he was found unresponsive by staff this morning around 8:00-8:30 am. EMS found patient profoundly hypotensive, with shallow slow breathing on 2L supplemental O2. and pnpont pupils.  Patient was given Narcan, small bolus of fluid, but still remained unresponsive although his breathing improved.   ED Course: On arrival to the ED he was hypotensive wuth BP 90/53 mmHg, supple febrile with temperature of 99.60F, with respirations up to 31/m and hypoxic with oxygen saturation of 79%. His blood work demonstrated leukocytosis-18,700, anemia with hemoglobin 9.5, hypokalemia with potassium 3.0, creatinine was elevated to 2.01, which is new calcium was 6.0 and albumin 1.6  Lactic acid on admission was 6.78 and escalated to 9.46 despite IV hydration Family refused to allow patient had chest x-ray The patient was anuric and urine specimen was not collected  Review of Systems: As per HPI otherwise 10 point review of systems negative.   Ambulatory Status: unknown  Past Medical History:  Diagnosis Date  . Hypertension   . Mild aortic insufficiency   . Mild aortic sclerosis ([place])   . Obesity   . Osteopenia   . Prostate cancer ([place])    STAGE 2  . Right bundle branch block (RBBB)   . Vitamin D deficiency     No past surgical history on file.  Social History   Social History  . Marital status: Married    Spouse name: N/A  . Number of children: N/A  . Years of education: N/A   Occupational History  . Not on file.    Social History Main Topics  . Smoking status: Former Research scientist (life sciences)  . Smokeless tobacco: Never Used  . Alcohol use Yes  . Drug use: No  . Sexual activity: Not on file   Other Topics Concern  . Not on file   Social History Narrative  . No narrative on file    Allergies  Allergen Reactions  . Hydralazine Itching  . Xarelto [Rivaroxaban] Hives    No family history on file.  Prior to Admission medications   Medication Sig Start Date End Date Taking? Authorizing Provider  acetaminophen (TYLENOL) 325 MG tablet Take 2 tablets (650 mg total) by mouth every 4 (four) hours as needed for mild pain (or temp > 99 F). 08/17/16   Ivan Anchors Love, PA-C  amLODipine (NORVASC) 5 MG tablet Take 1 tablet (5 mg total) by mouth daily. 08/18/16   Bary Leriche, PA-C  cephALEXin (KEFLEX) 250 MG capsule Take 1 capsule (250 mg total) by mouth every 8 (eight) hours. 08/18/16   Bary Leriche, PA-C  Cholecalciferol (VITAMIN D-3) 1000 UNITS CAPS Take 1,000 Units by mouth daily.     Historical Provider, MD  furosemide (LASIX) 20 MG tablet Take 20 mg by mouth daily. 07/06/16   Historical Provider, MD  loratadine (CLARITIN) 10 MG tablet Take 10 mg by mouth daily.    Historical Provider, MD  Menthol-Methyl Salicylate (MUSCLE RUB) 10-15 % CREA Apply 1 application topically 3 (three) times daily. To bilateral  Hips/thighs. 08/17/16   Olin Hauser  S Love, PA-C  oxyCODONE (OXYCONTIN) 15 mg 12 hr tablet Take 1 tablet (15 mg total) by mouth every 12 (twelve) hours. 08/17/16   Bary Leriche, PA-C  pantoprazole (PROTONIX) 40 MG tablet Take 1 tablet (40 mg total) by mouth daily. 08/18/16   Bary Leriche, PA-C  pravastatin (PRAVACHOL) 40 MG tablet Take 1 tablet (40 mg total) by mouth daily at 6 PM. 08/17/16   Ivan Anchors Love, PA-C  senna-docusate (SENOKOT-S) 8.6-50 MG tablet Take 3 tablets by mouth 2 (two) times daily. 08/17/16   Bary Leriche, PA-C  sodium phosphate (FLEET) 7-19 GM/118ML ENEM Place 133 mLs (1 enema total) rectally daily  as needed for severe constipation. 08/17/16   Bary Leriche, PA-C  traMADol (ULTRAM) 50 MG tablet Take 1 tablet (50 mg total) by mouth every 6 (six) hours as needed for moderate pain. 08/17/16   Bary Leriche, PA-C  traZODone (DESYREL) 50 MG tablet Take 0.5-1 tablets (25-50 mg total) by mouth at bedtime as needed for sleep. 08/17/16   Bary Leriche, PA-C    Physical Exam: Vitals:   01/26/2017 1230 01/15/2017 1315 01/05/2017 1345 01/23/2017 1400  BP: (!) 68/49 (!) 53/37 (!) 55/39 (!) 61/48  Pulse: (!) 37 64    Resp: (!) 24 (!) 23 (!) 23 (!) 21  Temp:      TempSrc:      SpO2: (!) 79% (!) 82%    Weight:        General: Appears calm and comfortable Eyes: unable to assess as patient keeps eyes closed and does not follow commands ENT:  mucous membranes dry and intact Neck: no lymphoadenopathy, masses or thyromegaly Cardiovascular: Irregularly irregular rhythm, no m/r/g. No JVD, carotid bruits. No LE edema.  Respiratory: bilateral diffuse coarse breath sound. Normal respiratory effort. No accessory muscle use observed Abdomen: soft, non-tender, non-distended, no organomegaly or masses appreciated. 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   Creatinine Clearance: Estimated Creatinine Clearance: 22.8 mL/min (A) (by C-G formula based on SCr of 2.01 mg/dL (H)).    Radiological Exams on Admission: Ct Head Wo Contrast  Result Date: 01/25/2017 CLINICAL DATA:  Unresponsive. EXAM: CT HEAD WITHOUT CONTRAST  TECHNIQUE: Contiguous axial images were obtained from the base of the skull through the vertex without intravenous contrast. COMPARISON:  07/29/2016 FINDINGS: Brain: There is no evidence for acute hemorrhage, hydrocephalus, mass lesion, or abnormal extra-axial fluid collection. No definite CT evidence for acute infarction. Diffuse loss of parenchymal volume is consistent with atrophy. Patchy low attenuation in the deep hemispheric and periventricular white matter is nonspecific, but likely reflects chronic microvascular ischemic demyelination. Right basal ganglia hematoma seen previously has resolved in the interval. Vascular: Atherosclerotic calcification is visualized in the intracranial segments of the internal carotid arteries. No dense MCA sign. Major dural sinuses are unremarkable. Skull: No evidence for fracture. No worrisome lytic or sclerotic lesion. Sinuses/Orbits: The visualized paranasal sinuses and mastoid air cells are clear. Visualized portions of the globes and intraorbital fat are  unremarkable. Other: None. IMPRESSION: Motion degraded study without acute intracranial abnormality. Atrophy with chronic small vessel white matter ischemic disease. Electronically Signed   By: Misty Stanley M.D.   On: 01/24/2017 11:42   Dg Chest Portable 1 View  Result Date: 01/30/2017 CLINICAL DATA:  Sepsis.  Found unresponsive. EXAM: PORTABLE CHEST 1 VIEW COMPARISON:  07/28/2016 FINDINGS: The heart is at the upper limits of normal in size. There is aortic atherosclerosis. The right chest is clear. There is left lower lobe collapse and/or pneumonia. Small amount of pleural fluid on the left. No acute bone finding. IMPRESSION: Left lower lobe collapse and/or pneumonia.  Left effusion. Electronically Signed   By: Nelson Chimes M.D.   On: 01/25/2017 15:11    EKG: Independently reviewed - atrial fibrillation with frequent PVCs, nonspecific T-wave changes  Assessment/Plan Principal Problem:   Severe sepsis with  septic shock (HCC) Active Problems:   Chronic a-fib (HCC)   Left hemiparesis (HCC)   Acute kidney injury ([place])   Severe sepsis with septic shock (CODE) (HCC)   Hypotension    Severe sepsis with septic shock with the end-organ damage due to severe infection Etiology is unclear, chest x-rays in process Most likely patient is approaching end-of-life and actively dying Family agreed to initiation of the comfort measures, but would like to continue antibiotic therapy  Hypotension secondary to sepsis with septic shock in patient with hitorical diagnosis of HTN  He had 3.5 L of NS and BP remains low, although mean pressure is 60 Patient is DNR and family does not want any pressors.   Acute Kidney Injury - due to end organ damage associated with severe sepsis  Chronic Afib - no new changes on EKG   DVT prophylaxis: none - comfort care Code Status: DNR Family Communication: at bedside Disposition Plan: Medsurg Consults called: Hospice service by EDP Admission status: Inpatient   York Grice, [place] Pager: [phone] [facility]  If 7PM-7AM, please contact night-coverage [email or web address] Password [facility]  01/09/2017, 3:25 PM

## 2017-01-18 NOTE — Progress Notes (Signed)
CSW and RN Case Manager informed of Patient by EDP. Per EDP, Palliative Care consult placed regarding Patient's disposition as Patient is actively dying. Patient's family have made Patient DNR/DNI w/ fluid resuscitation and IV antibiotics. Family would like for Patient to be kept comfortable. CSW has contacted Palliative Medicine for further assistance with admission vs. Hospice if deemed appropriate. Voicemail left for Palliative Care Team, CSW awaiting return phone call.    Lorrine Kin, MSW, LCSW [facility] ED/64M Clinical Social Worker [phone]

## 2017-01-18 NOTE — ED Triage Notes (Signed)
Per [facility] patient found unresponsive, with nonreactive pupils. Glucose 117. Patient initial blood pressure 70/30 with ems, and Patient received 1 mg narcan with ems, pulled dentures out and respirations increased from 8 to 25. Patient start on epi drip. Edp at bedside. Patient still unresponsive on arrival.

## 2017-01-18 NOTE — ED Notes (Signed)
Duoneb discontinued due to patient's chronic Afib.

## 2017-01-18 NOTE — Progress Notes (Signed)
Pharmacy Antibiotic Note  Thomas Burnett is a 83 y.o. male admitted on 01/07/2017 with sepsis.  Pharmacy has been consulted for vancomycin and zosyn dosing.Temperature not yet documented. WBC is elevated at 18.7, lactic acid is elevated at 6.78 and SCr is elevated at 2.01.  Plan: Vancomycin 1500mg  IV x 1 then 1gm IV Q24H Zosyn 3.375gm IV Q8H (4 hr inf) F/u renal fxn, C&S, clinical status and trough at SS  Weight: 184 lb (83.5 kg)  No data recorded.   Recent Labs Lab 01/12/2017 1115 01/29/2017 1132  WBC 18.7*  --   CREATININE 2.01*  --   LATICACIDVEN  --  6.78*    Estimated Creatinine Clearance: 22.8 mL/min (A) (by C-G formula based on SCr of 2.01 mg/dL (H)).    Allergies  Allergen Reactions  . Hydralazine Itching  . Xarelto [Rivaroxaban] Hives    Antimicrobials this admission: Vanc 4/17>> Zosyn 4/17>>  Dose adjustments this admission: N/A  Microbiology results: Pending  Thank you for allowing pharmacy to be a part of this patient's care.  Abdi Husak, Rande Lawman 01/26/2017 12:16 PM

## 2017-01-18 NOTE — ED Notes (Signed)
Per Dr. Marily Memos, all alarms on cardiac monitor turned off.

## 2017-01-18 NOTE — ED Notes (Signed)
Attempted in and out cath.  No urine produced.  EDP notified.

## 2017-01-19 LAB — BLOOD CULTURE ID PANEL (REFLEXED)

## 2017-01-21 LAB — CULTURE, BLOOD (ROUTINE X 2): Special Requests: ADEQUATE

## 2017-01-22 LAB — CULTURE, BLOOD (ROUTINE X 2): Special Requests: ADEQUATE

## 2017-02-01 NOTE — Discharge Summary (Signed)
Death Summary  Thomas Burnett HEN:[id number] DOB: 01/12/1920 DOA: Feb 14, 2017  PCP: Mathews Argyle, MD  Admit date: 02/14/2017 Date of Death: 02-15-2017  Final Diagnoses:  Principal Problem:   Severe sepsis with septic shock ([place]) Active Problems:   Chronic a-fib ([place])   Left hemiparesis ([place])   Acute kidney injury ([place])   Severe sepsis with septic shock (CODE) (HCC)   Hypotension   Altered mental status   End of life care     History of present illness:   LAMIR RACCA is a 83 y.o. male with medical history significant of HTN, valvular heart , recent stroke in October 2017 with residual left sided hemiparesis, prostate CA, chronic afib who was delivered from the SNF to the ED after he was found unresponsive by staff this morning around 8:00-8:30 am. EMS found patient profoundly hypotensive, with shallow slow breathing on 2L supplemental O2. and pnpont pupils.  Patient was given Narcan, small bolus of fluid, but still remained unresponsive although his breathing improved.   ED Course: On arrival to the ED he was hypotensive wuth BP 90/53 mmHg, supple febrile with temperature of 99.73F, with respirations up to 31/m and hypoxic with oxygen saturation of 79%. His blood work demonstrated leukocytosis-18,700, anemia with hemoglobin 9.5, hypokalemia with potassium 3.0, creatinine was elevated to 2.01, which is new calcium was 6.0 and albumin 1.6  Lactic acid on admission was 6.78 and escalated to 9.46 despite IV hydration Family refused to allow patient had chest x-ray The patient was anuric and urine specimen was not collected    Hospital Course:     Severe sepsis with septic shock with the end-organ damage due to severe infection. After initial resuscitation efforts were made in the emergency room patient was the DO NOT RESUSCITATE and was put on comfort care measures. Palliative care medicine was consulted to assist in this process. Patient passed away peacefully with  family at bedside in the hospital a few hours later.    Signed:  Mashanda Ishibashi J  [facility] 01/24/2017, 1:27 PM

## 2017-02-01 NOTE — Progress Notes (Signed)
Patient with change of status. Slow shallow breath. Family notified of change in patients status. Two RN at beside myself and charge nurse Becker. We observe and auscultated patient and there was absent breath sounds and heart sounds. Pupil fixed and dilated, no pulse palpable. Verified by both RN's. Pt is DNR, comfort care. Family arrived, at bed side soon after call. MD on call notified. Pt pronounced dead at 0236am.

## 2017-02-01 DEATH — deceased

## 2017-02-11 IMAGING — DX DG HIP (WITH OR WITHOUT PELVIS) 2V BILAT
6 series · 6 of 6 positions shown · non-contrast
Comparison: CT 01/06/2000.

CLINICAL DATA: Hip pain that radiates into left thigh.

EXAM:
DG HIP (WITH OR WITHOUT PELVIS) 2V BILAT

[pelvis ap (1 of 2)]
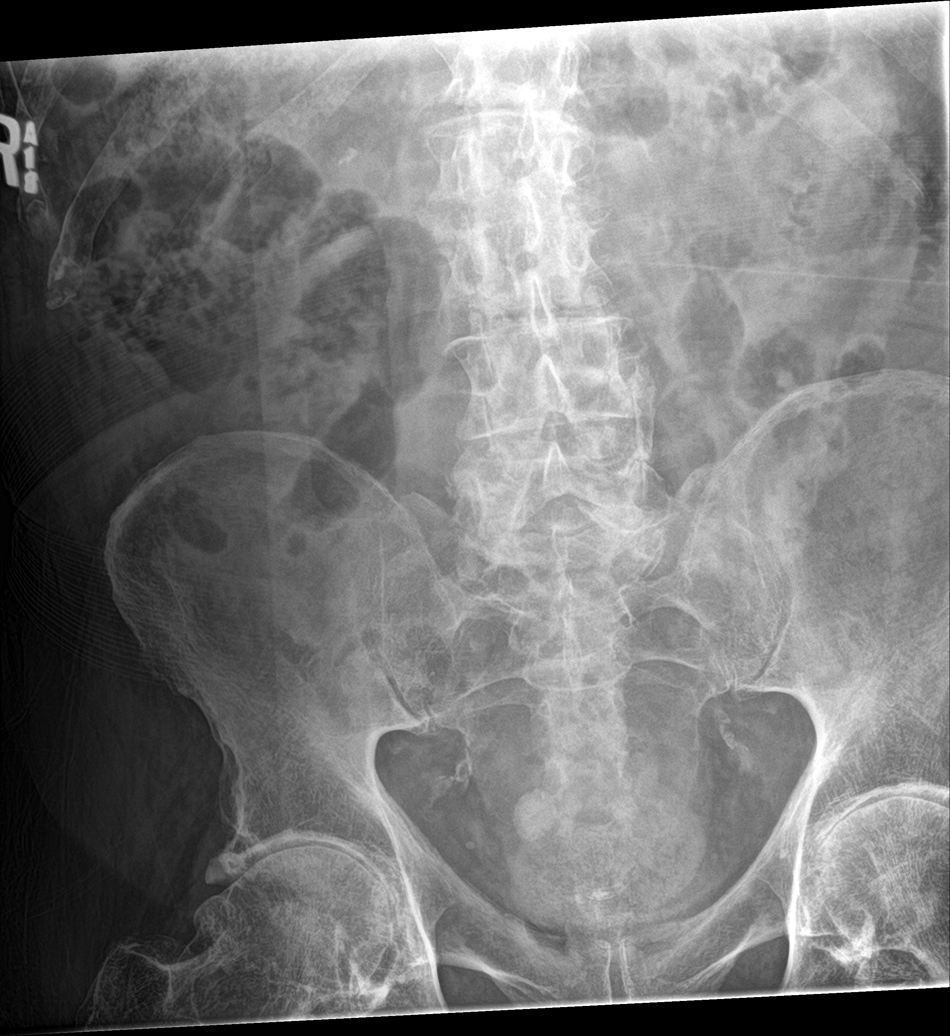

[hip ap (1 of 2)]
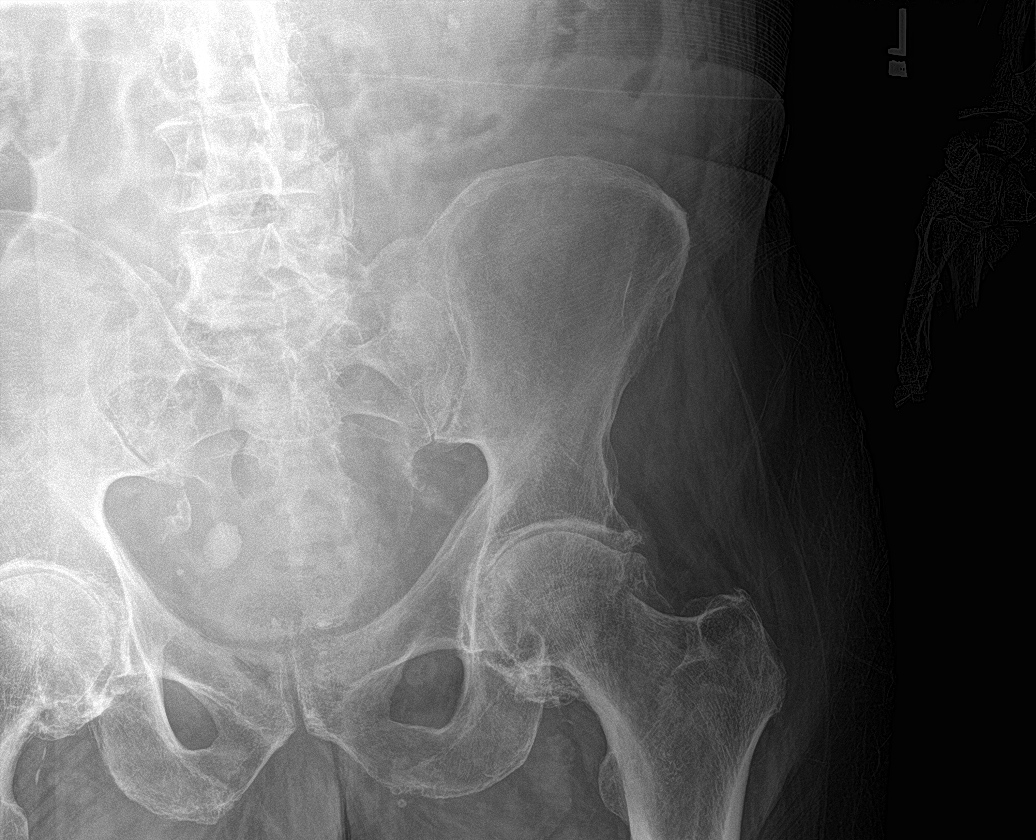

[hip lat (1 of 2)]
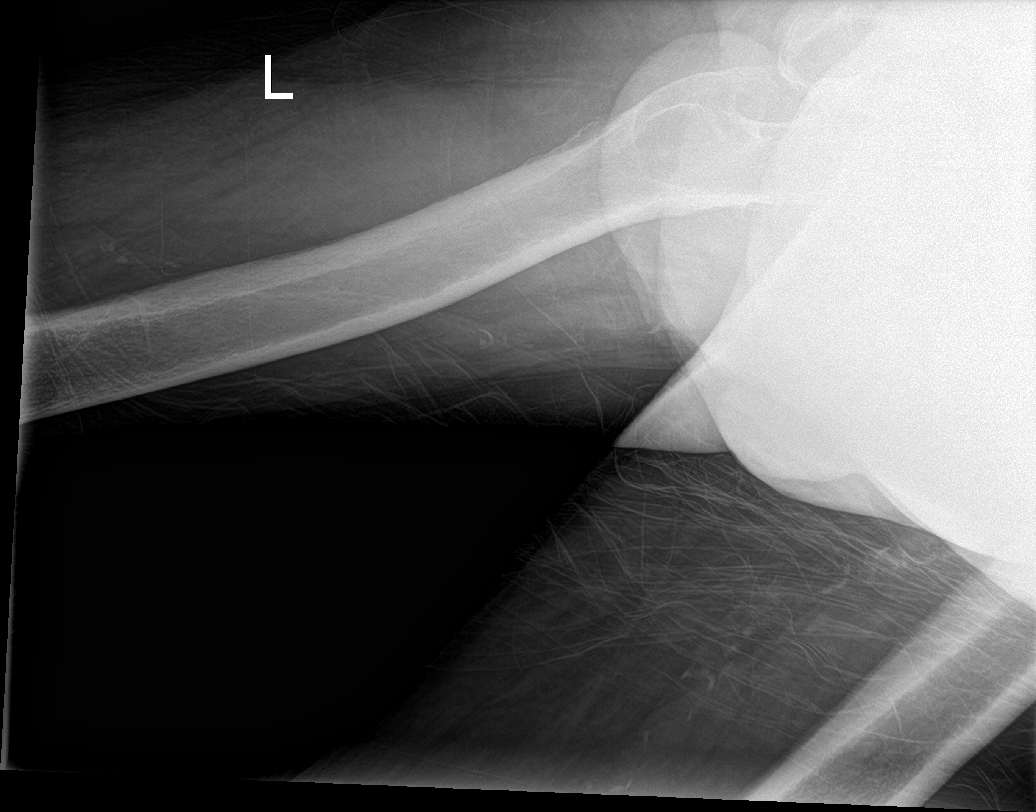

[hip ap (2 of 2)]
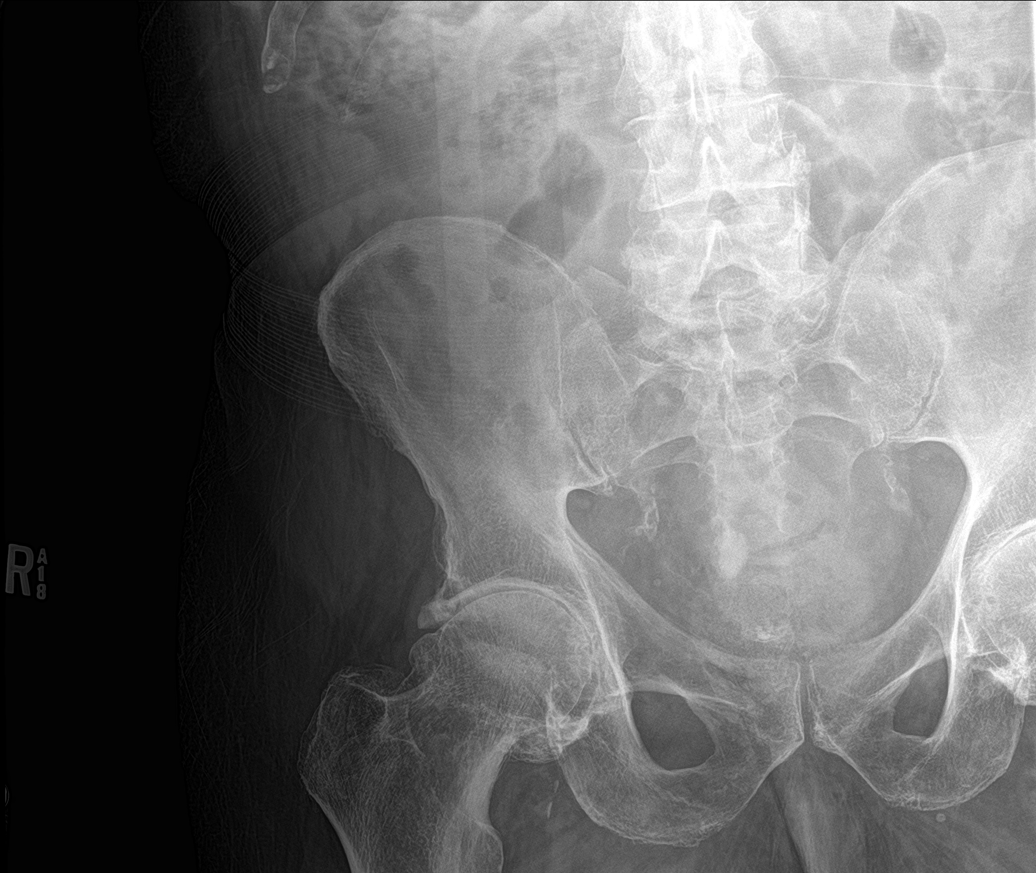

[hip lat (2 of 2)]
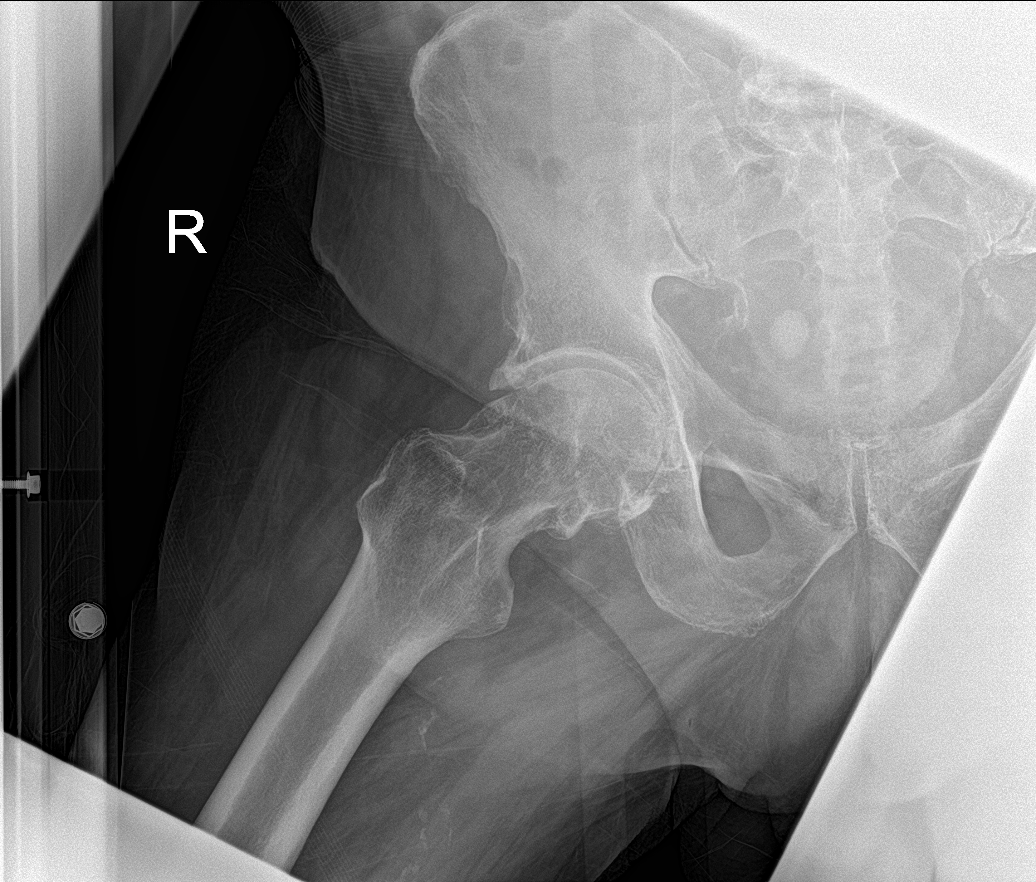

[pelvis ap (2 of 2)]
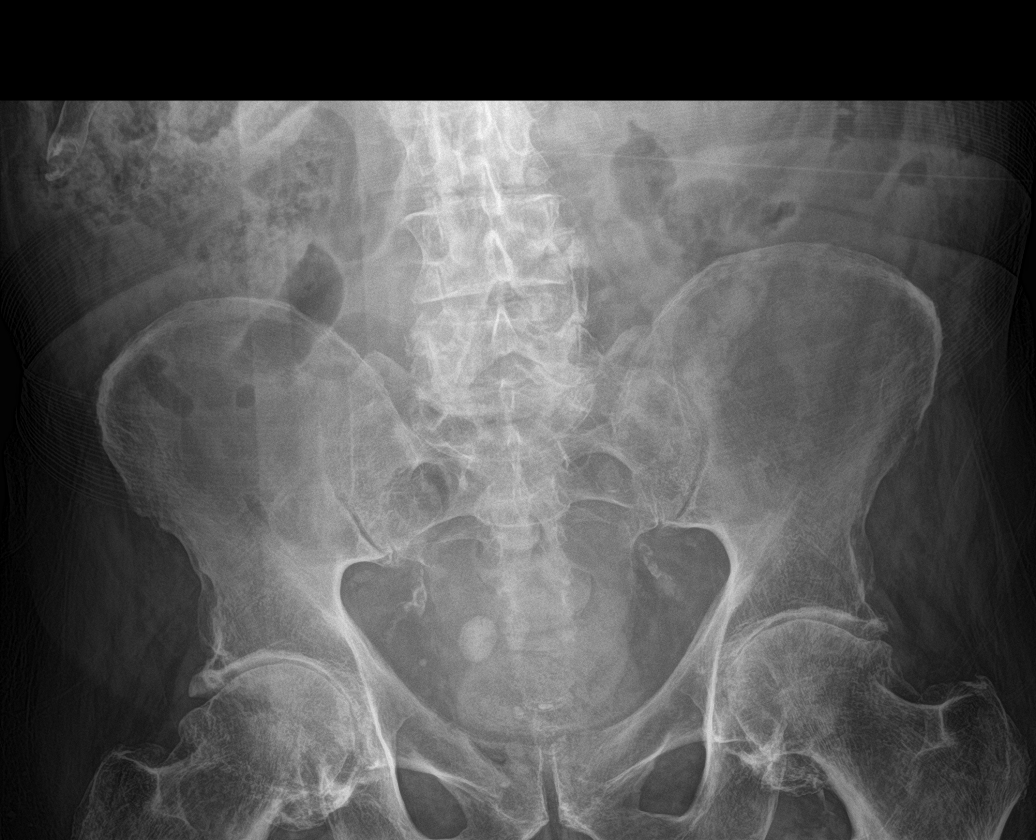

[6 of 6 positions shown; findings below may reference images not displayed]

FINDINGS: Degenerative changes lumbar spine and both hips. No acute bony
abnormality identified. No evidence of fracture. Coarse pelvic
calcification noted consistent with fibroid. Calcified pelvic
densities consistent phleboliths noted. Aortoiliac atherosclerotic
vascular calcification. Left nephrolithiasis.
IMPRESSION: Degenerative changes lumbar spine and both hips. Diffuse osteopenia.
No acute bony abnormality identified.

2. Probable calcified fibroid.  Left nephrolithiasis.

3.  Aortoiliac atherosclerotic vascular disease .
